# Patient Record
Sex: Female | Born: 1960
Health system: Southern US, Community
[De-identification: ages and names within clinical notes are randomized; demographics above are authoritative.]

## PROBLEM LIST (undated history)

## (undated) DIAGNOSIS — C801 Malignant (primary) neoplasm, unspecified: Secondary | ICD-10-CM

## (undated) DIAGNOSIS — Z85828 Personal history of other malignant neoplasm of skin: Secondary | ICD-10-CM

## (undated) HISTORY — PX: FINGER SURGERY: SHX640

## (undated) HISTORY — DX: Personal history of other malignant neoplasm of skin: Z85.828

---

## 2007-02-17 ENCOUNTER — Ambulatory Visit: Payer: Self-pay | Admitting: Obstetrics and Gynecology

## 2007-05-27 DIAGNOSIS — Z7251 High risk heterosexual behavior: Secondary | ICD-10-CM | POA: Insufficient documentation

## 2008-03-01 DIAGNOSIS — B373 Candidiasis of vulva and vagina: Secondary | ICD-10-CM | POA: Insufficient documentation

## 2008-03-01 DIAGNOSIS — B3731 Acute candidiasis of vulva and vagina: Secondary | ICD-10-CM | POA: Insufficient documentation

## 2008-07-19 DIAGNOSIS — M542 Cervicalgia: Secondary | ICD-10-CM | POA: Insufficient documentation

## 2008-07-19 DIAGNOSIS — N926 Irregular menstruation, unspecified: Secondary | ICD-10-CM | POA: Insufficient documentation

## 2008-08-30 ENCOUNTER — Ambulatory Visit: Payer: Self-pay | Admitting: Family Medicine

## 2008-09-06 ENCOUNTER — Ambulatory Visit: Payer: Self-pay | Admitting: Family Medicine

## 2010-05-26 ENCOUNTER — Ambulatory Visit: Payer: Self-pay | Admitting: Unknown Physician Specialty

## 2011-06-18 ENCOUNTER — Ambulatory Visit: Payer: Self-pay | Admitting: Unknown Physician Specialty

## 2013-09-27 DIAGNOSIS — C44310 Basal cell carcinoma of skin of unspecified parts of face: Secondary | ICD-10-CM | POA: Insufficient documentation

## 2013-12-06 DIAGNOSIS — Z85828 Personal history of other malignant neoplasm of skin: Secondary | ICD-10-CM

## 2013-12-06 HISTORY — DX: Personal history of other malignant neoplasm of skin: Z85.828

## 2014-05-01 DIAGNOSIS — L570 Actinic keratosis: Secondary | ICD-10-CM

## 2014-05-01 HISTORY — DX: Actinic keratosis: L57.0

## 2014-07-17 LAB — BASIC METABOLIC PANEL
BUN: 17 mg/dL (ref 4–21)
Creatinine: 0.7 mg/dL (ref 0.5–1.1)
Glucose: 86 mg/dL
Potassium: 4.6 mmol/L (ref 3.4–5.3)
Sodium: 142 mmol/L (ref 137–147)

## 2014-07-17 LAB — HEPATIC FUNCTION PANEL
ALT: 21 U/L (ref 7–35)
AST: 21 U/L (ref 13–35)

## 2014-07-17 LAB — LIPID PANEL
Cholesterol: 176 mg/dL (ref 0–200)
HDL: 97 mg/dL — AB (ref 35–70)
LDL Cholesterol: 65 mg/dL
Triglycerides: 69 mg/dL (ref 40–160)

## 2014-07-17 LAB — CBC AND DIFFERENTIAL
HCT: 43 % (ref 36–46)
Hemoglobin: 14.9 g/dL (ref 12.0–16.0)
Platelets: 290 10*3/uL (ref 150–399)
WBC: 4.2 10^3/mL

## 2014-07-17 LAB — TSH: TSH: 1.82 u[IU]/mL (ref 0.41–5.90)

## 2014-08-03 ENCOUNTER — Ambulatory Visit: Payer: Self-pay | Admitting: Gastroenterology

## 2014-08-03 LAB — HM COLONOSCOPY

## 2015-10-22 DIAGNOSIS — D259 Leiomyoma of uterus, unspecified: Secondary | ICD-10-CM | POA: Insufficient documentation

## 2015-10-22 DIAGNOSIS — Z8619 Personal history of other infectious and parasitic diseases: Secondary | ICD-10-CM | POA: Insufficient documentation

## 2015-10-25 ENCOUNTER — Ambulatory Visit (INDEPENDENT_AMBULATORY_CARE_PROVIDER_SITE_OTHER): Payer: BLUE CROSS/BLUE SHIELD | Admitting: Physician Assistant

## 2015-10-25 ENCOUNTER — Encounter: Payer: Self-pay | Admitting: Physician Assistant

## 2015-10-25 VITALS — BP 102/70 | HR 77 | Temp 98.2°F | Resp 16 | Ht 63.0 in | Wt 131.2 lb

## 2015-10-25 DIAGNOSIS — Z124 Encounter for screening for malignant neoplasm of cervix: Secondary | ICD-10-CM

## 2015-10-25 DIAGNOSIS — Z0000 Encounter for general adult medical examination without abnormal findings: Secondary | ICD-10-CM | POA: Diagnosis not present

## 2015-10-25 DIAGNOSIS — E538 Deficiency of other specified B group vitamins: Secondary | ICD-10-CM | POA: Insufficient documentation

## 2015-10-25 DIAGNOSIS — E559 Vitamin D deficiency, unspecified: Secondary | ICD-10-CM | POA: Insufficient documentation

## 2015-10-25 NOTE — Patient Instructions (Signed)
Health Maintenance, Female Adopting a healthy lifestyle and getting preventive care can go a long way to promote health and wellness. Talk with your health care provider about what schedule of regular examinations is right for you. This is a good chance for you to check in with your provider about disease prevention and staying healthy. In between checkups, there are plenty of things you can do on your own. Experts have done a lot of research about which lifestyle changes and preventive measures are most likely to keep you healthy. Ask your health care provider for more information. WEIGHT AND DIET  Eat a healthy diet  Be sure to include plenty of vegetables, fruits, low-fat dairy products, and lean protein.  Do not eat a lot of foods high in solid fats, added sugars, or salt.  Get regular exercise. This is one of the most important things you can do for your health.  Most adults should exercise for at least 150 minutes each week. The exercise should increase your heart rate and make you sweat (moderate-intensity exercise).  Most adults should also do strengthening exercises at least twice a week. This is in addition to the moderate-intensity exercise.  Maintain a healthy weight  Body mass index (BMI) is a measurement that can be used to identify possible weight problems. It estimates body fat based on height and weight. Your health care provider can help determine your BMI and help you achieve or maintain a healthy weight.  For females 28 years of age and older:   A BMI below 18.5 is considered underweight.  A BMI of 18.5 to 24.9 is normal.  A BMI of 25 to 29.9 is considered overweight.  A BMI of 30 and above is considered obese.  Watch levels of cholesterol and blood lipids  You should start having your blood tested for lipids and cholesterol at 53 years of age, then have this test every 5 years.  You may need to have your cholesterol levels checked more often if:  Your lipid  or cholesterol levels are high.  You are older than 53 years of age.  You are at high risk for heart disease.  CANCER SCREENING   Lung Cancer  Lung cancer screening is recommended for adults 75-66 years old who are at high risk for lung cancer because of a history of smoking.  A yearly low-dose CT scan of the lungs is recommended for people who:  Currently smoke.  Have quit within the past 15 years.  Have at least a 30-pack-year history of smoking. A pack year is smoking an average of one pack of cigarettes a day for 1 year.  Yearly screening should continue until it has been 15 years since you quit.  Yearly screening should stop if you develop a health problem that would prevent you from having lung cancer treatment.  Breast Cancer  Practice breast self-awareness. This means understanding how your breasts normally appear and feel.  It also means doing regular breast self-exams. Let your health care provider know about any changes, no matter how small.  If you are in your 20s or 30s, you should have a clinical breast exam (CBE) by a health care provider every 1-3 years as part of a regular health exam.  If you are 25 or older, have a CBE every year. Also consider having a breast X-ray (mammogram) every year.  If you have a family history of breast cancer, talk to your health care provider about genetic screening.  If you  are at high risk for breast cancer, talk to your health care provider about having an MRI and a mammogram every year.  Breast cancer gene (BRCA) assessment is recommended for women who have family members with BRCA-related cancers. BRCA-related cancers include:  Breast.  Ovarian.  Tubal.  Peritoneal cancers.  Results of the assessment will determine the need for genetic counseling and BRCA1 and BRCA2 testing. Cervical Cancer Your health care provider may recommend that you be screened regularly for cancer of the pelvic organs (ovaries, uterus, and  vagina). This screening involves a pelvic examination, including checking for microscopic changes to the surface of your cervix (Pap test). You may be encouraged to have this screening done every 3 years, beginning at age 21.  For women ages 30-65, health care providers may recommend pelvic exams and Pap testing every 3 years, or they may recommend the Pap and pelvic exam, combined with testing for human papilloma virus (HPV), every 5 years. Some types of HPV increase your risk of cervical cancer. Testing for HPV may also be done on women of any age with unclear Pap test results.  Other health care providers may not recommend any screening for nonpregnant women who are considered low risk for pelvic cancer and who do not have symptoms. Ask your health care provider if a screening pelvic exam is right for you.  If you have had past treatment for cervical cancer or a condition that could lead to cancer, you need Pap tests and screening for cancer for at least 20 years after your treatment. If Pap tests have been discontinued, your risk factors (such as having a new sexual partner) need to be reassessed to determine if screening should resume. Some women have medical problems that increase the chance of getting cervical cancer. In these cases, your health care provider may recommend more frequent screening and Pap tests. Colorectal Cancer  This type of cancer can be detected and often prevented.  Routine colorectal cancer screening usually begins at 53 years of age and continues through 53 years of age.  Your health care provider may recommend screening at an earlier age if you have risk factors for colon cancer.  Your health care provider may also recommend using home test kits to check for hidden blood in the stool.  A small camera at the end of a tube can be used to examine your colon directly (sigmoidoscopy or colonoscopy). This is done to check for the earliest forms of colorectal  cancer.  Routine screening usually begins at age 50.  Direct examination of the colon should be repeated every 5-10 years through 53 years of age. However, you may need to be screened more often if early forms of precancerous polyps or small growths are found. Skin Cancer  Check your skin from head to toe regularly.  Tell your health care provider about any new moles or changes in moles, especially if there is a change in a mole's shape or color.  Also tell your health care provider if you have a mole that is larger than the size of a pencil eraser.  Always use sunscreen. Apply sunscreen liberally and repeatedly throughout the day.  Protect yourself by wearing long sleeves, pants, a wide-brimmed hat, and sunglasses whenever you are outside. HEART DISEASE, DIABETES, AND HIGH BLOOD PRESSURE   High blood pressure causes heart disease and increases the risk of stroke. High blood pressure is more likely to develop in:  People who have blood pressure in the high end   of the normal range (130-139/85-89 mm Hg).  People who are overweight or obese.  People who are African American.  If you are 38-23 years of age, have your blood pressure checked every 3-5 years. If you are 61 years of age or older, have your blood pressure checked every year. You should have your blood pressure measured twice--once when you are at a hospital or clinic, and once when you are not at a hospital or clinic. Record the average of the two measurements. To check your blood pressure when you are not at a hospital or clinic, you can use:  An automated blood pressure machine at a pharmacy.  A home blood pressure monitor.  If you are between 45 years and 39 years old, ask your health care provider if you should take aspirin to prevent strokes.  Have regular diabetes screenings. This involves taking a blood sample to check your fasting blood sugar level.  If you are at a normal weight and have a low risk for diabetes,  have this test once every three years after 53 years of age.  If you are overweight and have a high risk for diabetes, consider being tested at a younger age or more often. PREVENTING INFECTION  Hepatitis B  If you have a higher risk for hepatitis B, you should be screened for this virus. You are considered at high risk for hepatitis B if:  You were born in a country where hepatitis B is common. Ask your health care provider which countries are considered high risk.  Your parents were born in a high-risk country, and you have not been immunized against hepatitis B (hepatitis B vaccine).  You have HIV or AIDS.  You use needles to inject street drugs.  You live with someone who has hepatitis B.  You have had sex with someone who has hepatitis B.  You get hemodialysis treatment.  You take certain medicines for conditions, including cancer, organ transplantation, and autoimmune conditions. Hepatitis C  Blood testing is recommended for:  Everyone born from [birth year] through 1964.  Anyone with known risk factors for hepatitis C. Sexually transmitted infections (STIs)  You should be screened for sexually transmitted infections (STIs) including gonorrhea and chlamydia if:  You are sexually active and are younger than 53 years of age.  You are older than 53 years of age and your health care provider tells you that you are at risk for this type of infection.  Your sexual activity has changed since you were last screened and you are at an increased risk for chlamydia or gonorrhea. Ask your health care provider if you are at risk.  If you do not have HIV, but are at risk, it may be recommended that you take a prescription medicine daily to prevent HIV infection. This is called pre-exposure prophylaxis (PrEP). You are considered at risk if:  You are sexually active and do not regularly use condoms or know the HIV status of your partner(s).  You take drugs by injection.  You are sexually  active with a partner who has HIV. Talk with your health care provider about whether you are at high risk of being infected with HIV. If you choose to begin PrEP, you should first be tested for HIV. You should then be tested every 3 months for as long as you are taking PrEP.  PREGNANCY   If you are premenopausal and you may become pregnant, ask your health care provider about preconception counseling.  If you may  become pregnant, take 400 to 800 micrograms (mcg) of folic acid every day.  If you want to prevent pregnancy, talk to your health care provider about birth control (contraception). OSTEOPOROSIS AND MENOPAUSE   Osteoporosis is a disease in which the bones lose minerals and strength with aging. This can result in serious bone fractures. Your risk for osteoporosis can be identified using a bone density scan.  If you are 61 years of age or older, or if you are at risk for osteoporosis and fractures, ask your health care provider if you should be screened.  Ask your health care provider whether you should take a calcium or vitamin D supplement to lower your risk for osteoporosis.  Menopause may have certain physical symptoms and risks.  Hormone replacement therapy may reduce some of these symptoms and risks. Talk to your health care provider about whether hormone replacement therapy is right for you.  HOME CARE INSTRUCTIONS   Schedule regular health, dental, and eye exams.  Stay current with your immunizations.   Do not use any tobacco products including cigarettes, chewing tobacco, or electronic cigarettes.  If you are pregnant, do not drink alcohol.  If you are breastfeeding, limit how much and how often you drink alcohol.  Limit alcohol intake to no more than 1 drink per day for nonpregnant women. One drink equals 12 ounces of beer, 5 ounces of wine, or 1 ounces of hard liquor.  Do not use street drugs.  Do not share needles.  Ask your health care provider for help if  you need support or information about quitting drugs.  Tell your health care provider if you often feel depressed.  Tell your health care provider if you have ever been abused or do not feel safe at home.   This information is not intended to replace advice given to you by your health care provider. Make sure you discuss any questions you have with your health care provider.   Document Released: 06/29/2011 Document Revised: 01/04/2015 Document Reviewed: 11/15/2013 Elsevier Interactive Patient Education [employer].

## 2015-10-25 NOTE — Progress Notes (Signed)
[name], Female    DOB: Apr 19, 1961, 54 y.o.   MRN: [id number] Visit Date: 10/25/2015  Today's Provider: [name], PA-C   Chief Complaint  Patient presents with  . Annual Exam   Subjective:    Annual physical exam Natalie Dickerson is a 54 y.o. female who presents today for health maintenance and complete physical. She feels well. She reports exercising, walking and cardio 4 times a week and some weight lifting one time a week. She reports she is sleeping fairly well. Patient Declined Flu Vaccine.  She cannot remember when her last Pap smear was done. She states her Pap smears have been normal. There is no family history of cervical or ovarian cancer. She does perform monthly self breast exams. Mammogram was done in January 2016 and was reported normal but with fibro-densities. There is no family history of breast cancer. Colonoscopy was done in 2015 and was positive for adenomatous polyps. This is to be repeated in 5 years. There is no family history of colon cancer.  Last PCP: 06/15/14 Mammogram: 01/02/2015 Normal/Negative-fibrous dense breast tissue Colonoscopy:08/03/14 with findings of adenomatous polyps. Repeat in 5 years    Review of Systems  Constitutional: Negative.   HENT: Negative.   Eyes: Negative.   Respiratory: Negative.   Cardiovascular: Negative.   Gastrointestinal: Negative.   Endocrine: Negative.   Genitourinary: Negative.   Musculoskeletal: Negative.   Skin: Negative.   Allergic/Immunologic: Negative.   Neurological: Negative.   Hematological: Negative.   Psychiatric/Behavioral: Negative.     Social History She  reports that she has quit smoking. She does not have any smokeless tobacco history on file. She reports that she drinks alcohol. She reports that she does not use illicit drugs. Social History   Social History  . Marital Status: Married    Spouse Name: N/A  . Number of Children: N/A  . Years of Education: N/A     Social History Main Topics  . Smoking status: Former Research scientist (life sciences)  . Smokeless tobacco: None     Comment: Smoked for about 5 years, smoked about 1 pack per day. Quit in 1984  . Alcohol Use: Yes     Comment: Occasional alcohol use; Drinks 2-4 glasses of wine per week.  . Drug Use: No  . Sexual Activity: Not Asked   Other Topics Concern  . None   Social History Narrative    Patient Active Problem List   Diagnosis Date Noted  . History of chicken pox 10/22/2015  . Uterine fibroid 10/22/2015  . Basal cell carcinoma of face 09/27/2013  . Cervical pain 07/19/2008  . Irregular bleeding 07/19/2008  . Candidal vulvovaginitis 03/01/2008  . High risk sexual behavior 05/27/2007    History reviewed. No pertinent past surgical history.  Family History  Family Status  Relation Status Death Age  . Mother Alive   . Father Deceased 59    died from breast cancer   . Maternal Grandmother Deceased   . Maternal Grandfather Deceased   . Paternal Grandmother Deceased   . Paternal Grandfather Deceased    Her family history includes Cancer in her mother; Hypertension in her mother.    No Known Allergies  Previous Medications   CHOLECALCIFEROL (VITAMIN D-3) 1000 UNITS CAPS    Take 1 capsule by mouth daily.   CYANOCOBALAMIN (VITAMIN B12 PO)    Take by mouth daily.   FLAXSEED-EVE PRIM-BORAGE (FLAX OIL XTRA) CAPS    Take by mouth.  MULTIPLE VITAMIN TABLET    Take by mouth.    Patient Care Team: [name], PA-C as PCP - General (Family Medicine)     Objective:   Vitals: BP 102/70 mmHg  Pulse 77  Temp(Src) 98.2 F (36.8 C)  Resp 16  Ht 5\' 3"  (1.6 m)  Wt 131 lb 3.2 oz (59.512 kg)  BMI 23.25 kg/m2   Physical Exam  Constitutional: She is oriented to person, place, and time. She appears well-developed and well-nourished. No distress.  HENT:  Head: Normocephalic and atraumatic.  Right Ear: Hearing, tympanic membrane, external ear and ear canal normal.  Left Ear: Hearing,  tympanic membrane, external ear and ear canal normal.  Nose: Nose normal.  Mouth/Throat: Uvula is midline, oropharynx is clear and moist and mucous membranes are normal. No oropharyngeal exudate.  Eyes: Conjunctivae and EOM are normal. Pupils are equal, round, and reactive to light. Right eye exhibits no discharge. Left eye exhibits no discharge. No scleral icterus.  Neck: Normal range of motion. Neck supple. No JVD present. Carotid bruit is not present. No tracheal deviation present. No thyromegaly present.  Cardiovascular: Normal rate, regular rhythm, normal heart sounds and intact distal pulses.  Exam reveals no gallop and no friction rub.   No murmur heard. Pulmonary/Chest: Effort normal and breath sounds normal. No respiratory distress. She has no wheezes. She has no rales. She exhibits no tenderness. Right breast exhibits no inverted nipple, no mass, no nipple discharge, no skin change and no tenderness. Left breast exhibits no inverted nipple, no mass, no nipple discharge, no skin change and no tenderness. Breasts are symmetrical.  Abdominal: Soft. Bowel sounds are normal. She exhibits no distension and no mass. There is no tenderness. There is no rebound and no guarding. Hernia confirmed negative in the right inguinal area and confirmed negative in the left inguinal area.  Genitourinary: Rectum normal, vagina normal and uterus normal. No breast swelling, tenderness, discharge or bleeding. Pelvic exam was performed with patient supine. There is no rash, tenderness, lesion or injury on the right labia. There is no rash, tenderness, lesion or injury on the left labia. Cervix exhibits no motion tenderness, no discharge and no friability. Right adnexum displays no mass, no tenderness and no fullness. Left adnexum displays no mass, no tenderness and no fullness. No erythema, tenderness or bleeding in the vagina. No signs of injury around the vagina. No vaginal discharge found.  Musculoskeletal: Normal  range of motion. She exhibits no edema or tenderness.  Lymphadenopathy:    She has no cervical adenopathy.       Right: No inguinal adenopathy present.       Left: No inguinal adenopathy present.  Neurological: She is alert and oriented to person, place, and time. She has normal reflexes. No cranial nerve deficit. Coordination normal.  Skin: Skin is warm and dry. No rash noted. She is not diaphoretic.  Psychiatric: She has a normal mood and affect. Her behavior is normal. Judgment and thought content normal.  Vitals reviewed.    Depression Screen No flowsheet data found.    Assessment & Plan:     Routine Health Maintenance and Physical Exam  1. Annual physical exam Physical exam was normal today. We will check labs as below. I will follow-up with her pending lab results or in one year for her repeat physical exam if lab results are normal. - CBC with Differential - Comprehensive metabolic panel - Lipid panel - TSH  2. Cervical cancer screening Pap was  collected today. We'll follow-up pending results. I will see her back in one year for her repeat physical exam. If Pap is normal will be repeated in 3 years. - Pap IG w/ reflex to HPV when [facility] ([facility])   Exercise Activities and Dietary recommendations Goals    None      Immunization History  Administered Date(s) Administered  . Tdap 08/16/2008    Health Maintenance  Topic Date Due  . Hepatitis C Screening  09/[birth year]/1962  . HIV Screening  12/14/1977  . PAP SMEAR  12/15/1983  . MAMMOGRAM  12/14/2012  . INFLUENZA VACCINE  07/29/2015  . TETANUS/TDAP  08/16/2018  . COLONOSCOPY  08/03/2024      Discussed health benefits of physical activity, and encouraged her to engage in regular exercise appropriate for her age and condition.    --------------------------------------------------------------------

## 2015-10-26 LAB — LIPID PANEL
Chol/HDL Ratio: 2.2 ratio units (ref 0.0–4.4)
Cholesterol, Total: 173 mg/dL (ref 100–199)
HDL: 80 mg/dL (ref >39)
LDL Calculated: 78 mg/dL (ref 0–99)
Triglycerides: 74 mg/dL (ref 0–149)
VLDL Cholesterol Cal: 15 mg/dL (ref 5–40)

## 2015-10-26 LAB — CBC WITH DIFFERENTIAL/PLATELET
Basophils Absolute: 0.1 10*3/uL (ref 0.0–0.2)
Basos: 2 %
EOS (ABSOLUTE): 0 10*3/uL (ref 0.0–0.4)
Eos: 0 %
Hematocrit: 42.2 % (ref 34.0–46.6)
Hemoglobin: 14 g/dL (ref 11.1–15.9)
Immature Grans (Abs): 0 10*3/uL (ref 0.0–0.1)
Immature Granulocytes: 0 %
Lymphocytes Absolute: 1.5 10*3/uL (ref 0.7–3.1)
Lymphs: 35 %
MCH: 29.8 pg (ref 26.6–33.0)
MCHC: 33.2 g/dL (ref 31.5–35.7)
MCV: 90 fL (ref 79–97)
Monocytes Absolute: 0.4 10*3/uL (ref 0.1–0.9)
Monocytes: 9 %
Neutrophils Absolute: 2.4 10*3/uL (ref 1.4–7.0)
Neutrophils: 54 %
Platelets: 295 10*3/uL (ref 150–379)
RBC: 4.7 x10E6/uL (ref 3.77–5.28)
RDW: 12.7 % (ref 12.3–15.4)
WBC: 4.5 10*3/uL (ref 3.4–10.8)

## 2015-10-26 LAB — COMPREHENSIVE METABOLIC PANEL
ALT: 20 IU/L (ref 0–32)
AST: 21 IU/L (ref 0–40)
Albumin/Globulin Ratio: 1.5 (ref 1.1–2.5)
Albumin: 4.2 g/dL (ref 3.5–5.5)
Alkaline Phosphatase: 82 IU/L (ref 39–117)
BUN/Creatinine Ratio: 17 (ref 9–23)
BUN: 11 mg/dL (ref 6–24)
Bilirubin Total: 0.4 mg/dL (ref 0.0–1.2)
CO2: 25 mmol/L (ref 18–29)
Calcium: 9.6 mg/dL (ref 8.7–10.2)
Chloride: 103 mmol/L (ref 97–106)
Creatinine, Ser: 0.65 mg/dL (ref 0.57–1.00)
GFR calc Af Amer: 118 mL/min/{1.73_m2} (ref >59)
GFR calc non Af Amer: 102 mL/min/{1.73_m2} (ref >59)
Globulin, Total: 2.8 g/dL (ref 1.5–4.5)
Glucose: 82 mg/dL (ref 65–99)
Potassium: 4.7 mmol/L (ref 3.5–5.2)
Sodium: 144 mmol/L (ref 136–144)
Total Protein: 7 g/dL (ref 6.0–8.5)

## 2015-10-26 LAB — TSH: TSH: 1.13 u[IU]/mL (ref 0.450–4.500)

## 2015-10-28 ENCOUNTER — Telehealth: Payer: Self-pay

## 2015-10-28 NOTE — Telephone Encounter (Signed)
-----   Message from [name], PA-C sent at 10/28/2015  8:55 AM EDT ----- Labs are WNL.

## 2015-10-28 NOTE — Telephone Encounter (Signed)
Patient advised as directed below.  Thanks,  -Judythe Postema 

## 2015-11-05 LAB — PAP IG W/ RFLX HPV ASCU: PAP Smear Comment: 0

## 2015-11-05 LAB — HPV DNA PROBE HIGH RISK, AMPLIFIED

## 2015-11-05 LAB — HPV, LOW VOLUME (REFLEX): HPV low volume reflex: NEGATIVE

## 2015-11-06 ENCOUNTER — Telehealth: Payer: Self-pay

## 2015-11-06 NOTE — Telephone Encounter (Signed)
LMTCB  Thanks,  -Shaida Route 

## 2015-11-06 NOTE — Telephone Encounter (Signed)
LMTCB  Thanks,  -Joseline 

## 2015-11-06 NOTE — Telephone Encounter (Signed)
-----   Message from [name], [place] sent at 11/06/2015  8:35 AM EST ----- Your pap test did return with atypical cells of undetermined significance.  It was HPV negative however.  ACOG guidelines for atypical cells with negative HPV for anyone over 30 is to repeat pap in 3 years like scheduled.  If wanted we can repeat in 12 months however.  ACOG guidelines recommend repeating in 12 months if you are younger than 53 years old or if it was HPV positive as well, which yours was not.  It should be ok to repeat in 3 years, but the decision is yours.

## 2015-11-06 NOTE — Telephone Encounter (Signed)
Pt returned call. Thanks TNP °

## 2015-11-06 NOTE — Telephone Encounter (Signed)
That is perfect.  We will repeat pap in 12 months.

## 2015-11-06 NOTE — Telephone Encounter (Signed)
Patient advised as directed below. Per patient if insurance pays for the 12 month repeat she prefers to be repeated in 12 months just to feel more safe.  Thanks,  -Joseline

## 2016-06-13 ENCOUNTER — Emergency Department
Admission: EM | Admit: 2016-06-13 | Discharge: 2016-06-13 | Disposition: A | Discharge status: Home / Self Care | Payer: Worker's Compensation | Attending: Emergency Medicine | Admitting: Emergency Medicine

## 2016-06-13 ENCOUNTER — Emergency Department: Payer: Worker's Compensation

## 2016-06-13 ENCOUNTER — Encounter: Payer: Self-pay | Admitting: Emergency Medicine

## 2016-06-13 DIAGNOSIS — F0781 Postconcussional syndrome: Secondary | ICD-10-CM | POA: Diagnosis not present

## 2016-06-13 DIAGNOSIS — S0093XA Contusion of unspecified part of head, initial encounter: Secondary | ICD-10-CM | POA: Insufficient documentation

## 2016-06-13 DIAGNOSIS — Z87891 Personal history of nicotine dependence: Secondary | ICD-10-CM | POA: Diagnosis not present

## 2016-06-13 DIAGNOSIS — W228XXA Striking against or struck by other objects, initial encounter: Secondary | ICD-10-CM | POA: Insufficient documentation

## 2016-06-13 DIAGNOSIS — Y929 Unspecified place or not applicable: Secondary | ICD-10-CM | POA: Diagnosis not present

## 2016-06-13 DIAGNOSIS — S0990XA Unspecified injury of head, initial encounter: Secondary | ICD-10-CM | POA: Diagnosis present

## 2016-06-13 DIAGNOSIS — Z79899 Other long term (current) drug therapy: Secondary | ICD-10-CM | POA: Insufficient documentation

## 2016-06-13 DIAGNOSIS — Y990 Civilian activity done for income or pay: Secondary | ICD-10-CM | POA: Diagnosis not present

## 2016-06-13 DIAGNOSIS — Z85828 Personal history of other malignant neoplasm of skin: Secondary | ICD-10-CM | POA: Insufficient documentation

## 2016-06-13 DIAGNOSIS — Y9302 Activity, running: Secondary | ICD-10-CM | POA: Insufficient documentation

## 2016-06-13 DIAGNOSIS — G44309 Post-traumatic headache, unspecified, not intractable: Secondary | ICD-10-CM | POA: Insufficient documentation

## 2016-06-13 NOTE — Discharge Instructions (Signed)
Concussion, Adult A concussion, or closed-head injury, is a brain injury caused by a direct blow to the head or by a quick and sudden movement (jolt) of the head or neck. Concussions are usually not life-threatening. Even so, the effects of a concussion can be serious. If you have had a concussion before, you are more likely to experience concussion-like symptoms after a direct blow to the head.  CAUSES  Direct blow to the head, such as from running into another player during a soccer game, being hit in a fight, or hitting your head on a hard surface.  A jolt of the head or neck that causes the brain to move back and forth inside the skull, such as in a car crash. SIGNS AND SYMPTOMS The signs of a concussion can be hard to notice. Early on, they may be missed by you, family members, and health care providers. You may look fine but act or feel differently. Symptoms are usually temporary, but they may last for days, weeks, or even longer. Some symptoms may appear right away while others may not show up for hours or days. Every head injury is different. Symptoms include:  Mild to moderate headaches that will not go away.  A feeling of pressure inside your head.  Having more trouble than usual:  Learning or remembering things you have heard.  Answering questions.  Paying attention or concentrating.  Organizing daily tasks.  Making decisions and solving problems.  Slowness in thinking, acting or reacting, speaking, or reading.  Getting lost or being easily confused.  Feeling tired all the time or lacking energy (fatigued).  Feeling drowsy.  Sleep disturbances.  Sleeping more than usual.  Sleeping less than usual.  Trouble falling asleep.  Trouble sleeping (insomnia).  Loss of balance or feeling lightheaded or dizzy.  Nausea or vomiting.  Numbness or tingling.  Increased sensitivity to:  Sounds.  Lights.  Distractions.  Vision problems or eyes that tire  easily.  Diminished sense of taste or smell.  Ringing in the ears.  Mood changes such as feeling sad or anxious.  Becoming easily irritated or angry for little or no reason.  Lack of motivation.  Seeing or hearing things other people do not see or hear (hallucinations). DIAGNOSIS Your health care provider can usually diagnose a concussion based on a description of your injury and symptoms. He or she will ask whether you passed out (lost consciousness) and whether you are having trouble remembering events that happened right before and during your injury. Your evaluation might include:  A brain scan to look for signs of injury to the brain. Even if the test shows no injury, you may still have a concussion.  Blood tests to be sure other problems are not present. TREATMENT  Concussions are usually treated in an emergency department, in urgent care, or at a clinic. You may need to stay in the hospital overnight for further treatment.  Tell your health care provider if you are taking any medicines, including prescription medicines, over-the-counter medicines, and natural remedies. Some medicines, such as blood thinners (anticoagulants) and aspirin, may increase the chance of complications. Also tell your health care provider whether you have had alcohol or are taking illegal drugs. This information may affect treatment.  Your health care provider will send you home with important instructions to follow.  How fast you will recover from a concussion depends on many factors. These factors include how severe your concussion is, what part of your brain was injured,  your age, and how healthy you were before the concussion.  Most people with mild injuries recover fully. Recovery can take time. In general, recovery is slower in older persons. Also, persons who have had a concussion in the past or have other medical problems may find that it takes longer to recover from their current injury. HOME  CARE INSTRUCTIONS General Instructions  Carefully follow the directions your health care provider gave you.  Only take over-the-counter or prescription medicines for pain, discomfort, or fever as directed by your health care provider.  Take only those medicines that your health care provider has approved.  Do not drink alcohol until your health care provider says you are well enough to do so. Alcohol and certain other drugs may slow your recovery and can put you at risk of further injury.  If it is harder than usual to remember things, write them down.  If you are easily distracted, try to do one thing at a time. For example, do not try to watch TV while fixing dinner.  Talk with family members or close friends when making important decisions.  Keep all follow-up appointments. Repeated evaluation of your symptoms is recommended for your recovery.  Watch your symptoms and tell others to do the same. Complications sometimes occur after a concussion. Older adults with a brain injury may have a higher risk of serious complications, such as a blood clot on the brain.  Tell your teachers, school nurse, school counselor, coach, athletic trainer, or work Freight forwarder about your injury, symptoms, and restrictions. Tell them about what you can or cannot do. They should watch for:  Increased problems with attention or concentration.  Increased difficulty remembering or learning new information.  Increased time needed to complete tasks or assignments.  Increased irritability or decreased ability to cope with stress.  Increased symptoms.  Rest. Rest helps the brain to heal. Make sure you:  Get plenty of sleep at night. Avoid staying up late at night.  Keep the same bedtime hours on weekends and weekdays.  Rest during the day. Take daytime naps or rest breaks when you feel tired.  Limit activities that require a lot of thought or concentration. These include:  Doing homework or job-related  work.  Watching TV.  Working on the computer.  Avoid any situation where there is potential for another head injury (football, hockey, soccer, basketball, martial arts, downhill snow sports and horseback riding). Your condition will get worse every time you experience a concussion. You should avoid these activities until you are evaluated by the appropriate follow-up health care providers. Returning To Your Regular Activities You will need to return to your normal activities slowly, not all at once. You must give your body and brain enough time for recovery.  Do not return to sports or other athletic activities until your health care provider tells you it is safe to do so.  Ask your health care provider when you can drive, ride a bicycle, or operate heavy machinery. Your ability to react may be slower after a brain injury. Never do these activities if you are dizzy.  Ask your health care provider about when you can return to work or school. Preventing Another Concussion It is very important to avoid another brain injury, especially before you have recovered. In rare cases, another injury can lead to permanent brain damage, brain swelling, or death. The risk of this is greatest during the first 7-10 days after a head injury. Avoid injuries by:  Wearing a  seat belt when riding in a car.  Drinking alcohol only in moderation.  Wearing a helmet when biking, skiing, skateboarding, skating, or doing similar activities.  Avoiding activities that could lead to a second concussion, such as contact or recreational sports, until your health care provider says it is okay.  Taking safety measures in your home.  Remove clutter and tripping hazards from floors and stairways.  Use grab bars in bathrooms and handrails by stairs.  Place non-slip mats on floors and in bathtubs.  Improve lighting in dim areas. SEEK MEDICAL CARE IF:  You have increased problems paying attention or  concentrating.  You have increased difficulty remembering or learning new information.  You need more time to complete tasks or assignments than before.  You have increased irritability or decreased ability to cope with stress.  You have more symptoms than before. Seek medical care if you have any of the following symptoms for more than 2 weeks after your injury:  Lasting (chronic) headaches.  Dizziness or balance problems.  Nausea.  Vision problems.  Increased sensitivity to noise or light.  Depression or mood swings.  Anxiety or irritability.  Memory problems.  Difficulty concentrating or paying attention.  Sleep problems.  Feeling tired all the time. SEEK IMMEDIATE MEDICAL CARE IF:  You have severe or worsening headaches. These may be a sign of a blood clot in the brain.  You have weakness (even if only in one hand, leg, or part of the face).  You have numbness.  You have decreased coordination.  You vomit repeatedly.  You have increased sleepiness.  One pupil is larger than the other.  You have convulsions.  You have slurred speech.  You have increased confusion. This may be a sign of a blood clot in the brain.  You have increased restlessness, agitation, or irritability.  You are unable to recognize people or places.  You have neck pain.  It is difficult to wake you up.  You have unusual behavior changes.  You lose consciousness. MAKE SURE YOU:  Understand these instructions.  Will watch your condition.  Will get help right away if you are not doing well or get worse.   This information is not intended to replace advice given to you by your health care provider. Make sure you discuss any questions you have with your health care provider.   Document Released: 03/05/2004 Document Revised: 01/04/2015 Document Reviewed: 07/06/2013 Elsevier Interactive Patient Education 2016 [place] A contusion is a deep bruise.  Contusions happen when an injury causes bleeding under the skin. Symptoms of bruising include pain, swelling, and discolored skin. The skin may turn blue, purple, or yellow. HOME CARE   Rest the injured area.  If told, put ice on the injured area.  Put ice in a plastic bag.  Place a towel between your skin and the bag.  Leave the ice on for 20 minutes, 2-3 times per day.  If told, put light pressure (compression) on the injured area using an elastic bandage. Make sure the bandage is not too tight. Remove it and put it back on as told by your doctor.  If possible, raise (elevate) the injured area above the level of your heart while you are sitting or lying down.  Take over-the-counter and prescription medicines only as told by your doctor. GET HELP IF:  Your symptoms do not get better after several days of treatment.  Your symptoms get worse.  You have trouble moving the injured  area. GET HELP RIGHT AWAY IF:   You have very bad pain.  You have a loss of feeling (numbness) in a hand or foot.  Your hand or foot turns pale or cold.   This information is not intended to replace advice given to you by your health care provider. Make sure you discuss any questions you have with your health care provider.   Document Released: 06/01/2008 Document Revised: 09/04/2015 Document Reviewed: 05/01/2015 Elsevier Interactive Patient Education 2016 [place].  Facial or Scalp Contusion A facial or scalp contusion is a deep bruise on the face or head. Injuries to the face and head generally cause a lot of swelling, especially around the eyes. Contusions are the result of an injury that caused bleeding under the skin. The contusion may turn blue, purple, or yellow. Minor injuries will give you a painless contusion, but more severe contusions may stay painful and swollen for a few weeks.  CAUSES  A facial or scalp contusion is caused by a blunt injury or trauma to the face or head area.   SIGNS AND SYMPTOMS   Swelling of the injured area.   Discoloration of the injured area.   Tenderness, soreness, or pain in the injured area.  DIAGNOSIS  The diagnosis can be made by taking a medical history and doing a physical exam. An X-ray exam, CT scan, or MRI may be needed to determine if there are any associated injuries, such as broken bones (fractures). TREATMENT  Often, the best treatment for a facial or scalp contusion is applying cold compresses to the injured area. Over-the-counter medicines may also be recommended for pain control.  HOME CARE INSTRUCTIONS   Only take over-the-counter or prescription medicines as directed by your health care provider.   Apply ice to the injured area.   Put ice in a plastic bag.   Place a towel between your skin and the bag.   Leave the ice on for 20 minutes, 2-3 times a day.  SEEK MEDICAL CARE IF:  You have bite problems.   You have pain with chewing.   You are concerned about facial defects. SEEK IMMEDIATE MEDICAL CARE IF:  You have severe pain or a headache that is not relieved by medicine.   You have unusual sleepiness, confusion, or personality changes.   You throw up (vomit).   You have a persistent nosebleed.   You have double vision or blurred vision.   You have fluid drainage from your nose or ear.   You have difficulty walking or using your arms or legs.  MAKE SURE YOU:   Understand these instructions.  Will watch your condition.  Will get help right away if you are not doing well or get worse.   This information is not intended to replace advice given to you by your health care provider. Make sure you discuss any questions you have with your health care provider.   Document Released: 01/21/2005 Document Revised: 01/04/2015 Document Reviewed: 07/27/2013 Elsevier Interactive Patient Education 2016 [place] Injury, Adult You have received a head injury. It does not appear  serious at this time. Headaches and vomiting are common following head injury. It should be easy to awaken from sleeping. Sometimes it is necessary for you to stay in the emergency department for a while for observation. Sometimes admission to the hospital may be needed. After injuries such as yours, most problems occur within the first 24 hours, but side effects may occur up to 7-10 days  after the injury. It is important for you to carefully monitor your condition and contact your health care provider or seek immediate medical care if there is a change in your condition. WHAT ARE THE TYPES OF HEAD INJURIES? Head injuries can be as minor as a bump. Some head injuries can be more severe. More severe head injuries include:  A jarring injury to the brain (concussion).  A bruise of the brain (contusion). This mean there is bleeding in the brain that can cause swelling.  A cracked skull (skull fracture).  Bleeding in the brain that collects, clots, and forms a bump (hematoma). WHAT CAUSES A HEAD INJURY? A serious head injury is most likely to happen to someone who is in a car wreck and is not wearing a seat belt. Other causes of major head injuries include bicycle or motorcycle accidents, sports injuries, and falls. HOW ARE HEAD INJURIES DIAGNOSED? A complete history of the event leading to the injury and your current symptoms will be helpful in diagnosing head injuries. Many times, pictures of the brain, such as CT or MRI are needed to see the extent of the injury. Often, an overnight hospital stay is necessary for observation.  WHEN SHOULD I SEEK IMMEDIATE MEDICAL CARE?  You should get help right away if:  You have confusion or drowsiness.  You feel sick to your stomach (nauseous) or have continued, forceful vomiting.  You have dizziness or unsteadiness that is getting worse.  You have severe, continued headaches not relieved by medicine. Only take over-the-counter or prescription medicines for  pain, fever, or discomfort as directed by your health care provider.  You do not have normal function of the arms or legs or are unable to walk.  You notice changes in the black spots in the center of the colored part of your eye (pupil).  You have a clear or bloody fluid coming from your nose or ears.  You have a loss of vision. During the next 24 hours after the injury, you must stay with someone who can watch you for the warning signs. This person should contact local emergency services (911 in the [place] if you have seizures, you become unconscious, or you are unable to wake up. HOW CAN I PREVENT A HEAD INJURY IN THE FUTURE? The most important factor for preventing major head injuries is avoiding motor vehicle accidents. To minimize the potential for damage to your head, it is crucial to wear seat belts while riding in motor vehicles. Wearing helmets while bike riding and playing collision sports (like football) is also helpful. Also, avoiding dangerous activities around the house will further help reduce your risk of head injury.  WHEN CAN I RETURN TO NORMAL ACTIVITIES AND ATHLETICS? You should be reevaluated by your health care provider before returning to these activities. If you have any of the following symptoms, you should not return to activities or contact sports until 1 week after the symptoms have stopped:  Persistent headache.  Dizziness or vertigo.  Poor attention and concentration.  Confusion.  Memory problems.  Nausea or vomiting.  Fatigue or tire easily.  Irritability.  Intolerant of bright lights or loud noises.  Anxiety or depression.  Disturbed sleep. MAKE SURE YOU:   Understand these instructions.  Will watch your condition.  Will get help right away if you are not doing well or get worse.   This information is not intended to replace advice given to you by your health care provider. Make sure you discuss any  questions you have with your health care  provider.   Document Released: 12/14/2005 Document Revised: 01/04/2015 Document Reviewed: 08/21/2013 Elsevier Interactive Patient Education 2016 [employer]  Post-Concussion Syndrome Post-concussion syndrome describes the symptoms that can occur after a head injury. These symptoms can last from weeks to months. CAUSES  It is not clear why some head injuries cause post-concussion syndrome. It can occur whether your head injury was mild or severe and whether you were wearing head protection or not.  SIGNS AND SYMPTOMS  Memory difficulties.  Dizziness.  Headaches.  Double vision or blurry vision.  Sensitivity to light.  Hearing difficulties.  Depression.  Tiredness.  Weakness.  Difficulty with concentration.  Difficulty sleeping or staying asleep.  Vomiting.  Poor balance or instability on your feet.  Slow reaction time.  Difficulty learning and remembering things you have heard. DIAGNOSIS  There is no test to determine whether you have post-concussion syndrome. Your health care provider may order an imaging scan of your brain, such as a CT scan, to check for other problems that may be causing your symptoms (such as a severe injury inside your skull). TREATMENT  Usually, these problems disappear over time without medical care. Your health care provider may prescribe medicine to help ease your symptoms. It is important to follow up with a neurologist to evaluate your recovery and address any lingering symptoms or issues. HOME CARE INSTRUCTIONS   Take medicines only as directed by your health care provider. Do not take aspirin. Aspirin can slow blood clotting.  Sleep with your head slightly elevated to help with headaches.  Avoid any situation where there is potential for another head injury. This includes football, hockey, soccer, basketball, martial arts, downhill snow sports, and horseback riding. Your condition will get worse every time you experience a concussion.  You should avoid these activities until you are evaluated by the appropriate follow-up health care providers.  Keep all follow-up visits as directed by your health care provider. This is important. SEEK MEDICAL CARE IF:  You have increased problems paying attention or concentrating.  You have increased difficulty remembering or learning new information.  You need more time to complete tasks or assignments than before.  You have increased irritability or decreased ability to cope with stress.  You have more symptoms than before. Seek medical care if you have any of the following symptoms for more than two weeks after your injury:  Lasting (chronic) headaches.  Dizziness or balance problems.  Nausea.  Vision problems.  Increased sensitivity to noise or light.  Depression or mood swings.  Anxiety or irritability.  Memory problems.  Difficulty concentrating or paying attention.  Sleep problems.  Feeling tired all the time. SEEK IMMEDIATE MEDICAL CARE IF:  You have confusion or unusual drowsiness.  Others find it difficult to wake you up.  You have nausea or persistent, forceful vomiting.  You feel like you are moving when you are not (vertigo). Your eyes may move rapidly back and forth.  You have convulsions or faint.  You have severe, persistent headaches that are not relieved by medicine.  You cannot use your arms or legs normally.  One of your pupils is larger than the other.  You have clear or bloody discharge from your nose or ears.  Your problems are getting worse, not better. MAKE SURE YOU:  Understand these instructions.  Will watch your condition.  Will get help right away if you are not doing well or get worse.   This information  is not intended to replace advice given to you by your health care provider. Make sure you discuss any questions you have with your health care provider.   Document Released: 06/05/2002 Document Revised: 01/04/2015  Document Reviewed: 03/21/2014 Elsevier Interactive Patient Education [employer].

## 2016-06-13 NOTE — ED Notes (Signed)
Intermittent headaches since ran into pole with head May 29th at work. Denies LOC at original injury. This headache began yesterday.

## 2016-06-13 NOTE — ED Provider Notes (Signed)
[facility] Emergency Department Provider Note  ____________________________________________  Time seen: Approximately 9:47 AM  I have reviewed the triage vital signs and the nursing notes.   HISTORY  Chief Complaint Headache    HPI Natalie Dickerson is a 55 y.o. female , NAD, presents to the emergency department with several week history of intermittent headaches. States on May 29 while she was working, she ran into a metal pole. States that she had pain about the area at the time but no swelling, dizziness, LOC. Felt nauseous for a couple of days after the incident but that has resolved. Has had intermittent headaches since the incident. Went to [facility] one week later for assessment in which x-rays were completed. Patient was told at the time of discharge that everything looked okay but was called later stating there was a possibility she had an orbital fracture but there was no treatment for such and it would heal on its own. Patient does note that she had mild bruising about the lateral aspect of the left orbital area. Never had black or any hematomas. Notes that her headaches feel like pressure about the left side of her head about the temporal region. Has not had any changes in vision, tinnitus, changes in hearing nor ear pain. Denies any other traumas or injuries to the face or neck. Notes that occasionally she has a "tic" in which her head turns to the left. States this is rare and quick in nature. Has not had any neck pain nor any back pain. Denies numbness, weakness, tingling. Has not had any changes in her speech or gait. Did take 2 extra strength Tylenol last night for her headache without any resolution of symptoms.   History reviewed. No pertinent past medical history.  Patient Active Problem List   Diagnosis Date Noted  . Avitaminosis D 10/25/2015  . B12 deficiency 10/25/2015  . History of chicken pox 10/22/2015  . Uterine fibroid 10/22/2015  .  Basal cell carcinoma of face 09/27/2013  . Cervical pain 07/19/2008  . Irregular bleeding 07/19/2008  . Candidal vulvovaginitis 03/01/2008  . High risk sexual behavior 05/27/2007    Past Surgical History  Procedure Laterality Date  . Finger surgery      Current Outpatient Rx  Name  Route  Sig  Dispense  Refill  . Cholecalciferol (VITAMIN D-3) 1000 UNITS CAPS   Oral   Take 1 capsule by mouth daily.         . Cyanocobalamin (VITAMIN B12 PO)   Oral   Take by mouth daily.         Randell Loop Prim-Borage (FLAX OIL XTRA) CAPS   Oral   Take by mouth.         . Multiple Vitamin tablet   Oral   Take by mouth.           Allergies Review of patient's allergies indicates no known allergies.  Family History  Problem Relation Age of Onset  . Hypertension Mother   . Cancer Mother     Breast Cancer    Social History Social History  Substance Use Topics  . Smoking status: Former Research scientist (life sciences)  . Smokeless tobacco: None     Comment: Smoked for about 5 years, smoked about 1 pack per day. Quit in 1984  . Alcohol Use: Yes     Comment: Occasional alcohol use; Drinks 2-4 glasses of wine per week.     Review of Systems  Constitutional: No fever/chills, fatigue Eyes:  No visual changes. No discharge, redness, pain, swelling ENT: No sore throat, tinnitus, ear pain, jaw pain. Cardiovascular: No chest pain. Respiratory: No shortness of breath. No wheezing.  Gastrointestinal: Positive nausea that has resolved. No abdominal pain.  No vomiting.   Musculoskeletal: Negative for back or neck pain.  Skin: Positive bruising that has resolved. Negative for rash, redness, swelling, open wounds or lacerations. Neurological: "Tic" about neck. Lightheaded ness today. Positive for headaches, but no focal weakness or numbness. No saddle paraesthesias, tingling. No changes in speech or gait.  10-point ROS otherwise negative.  ____________________________________________   PHYSICAL  EXAM:  VITAL SIGNS: ED Triage Vitals  Enc Vitals Group     BP --      Pulse --      Resp --      Temp --      Temp src --      SpO2 --      Weight --      Height --      Head Cir --      Peak Flow --      Pain Score --      Pain Loc --      Pain Edu? --      Excl. in [place]? --      Constitutional: Alert and oriented. Well appearing and in no acute distress. Eyes: Conjunctivae are normal. PERRLA. EOMI without pain. No pain to palpation about both eyes closed. Head: Atraumatic. ENT:      Ears: TMs visualized bilaterally without erythema, bulging, effusion, perforation.       Nose: No congestion/rhinnorhea.      Mouth/Throat: Mucous membranes are moist.  Neck: Supple with FROM. No cervical spine tenderness to palpation. No trapezial muscle spasms.  Hematological/Lymphatic/Immunilogical: No cervical lymphadenopathy. Cardiovascular: Normal rate, regular rhythm. Normal S1 and S2.  Good peripheral circulation with 2+ pulses in bilateral upper and lower extremities. Respiratory: Normal respiratory effort without tachypnea or retractions. Lungs CTAB with breath sounds noted in all lung fields.  Musculoskeletal: No lower extremity tenderness nor edema.  No joint effusions. Neurologic:  Normal speech and language. No gross focal neurologic deficits are appreciated. No tics or tremors. CN III-XII grossly in tact. Sensation to light touch grossly in tact about bilateral upper and lower extremities.  Skin:  Skin is warm, dry and intact. No rash, bruising, open wounds, redness, swelling noted. Psychiatric: Mood and affect are normal. Speech and behavior are normal. Patient exhibits appropriate insight and judgement.   ____________________________________________   LABS  None ____________________________________________  EKG  None ____________________________________________  RADIOLOGY I have personally viewed and evaluated these images (plain radiographs) as part of my medical  decision making, as well as reviewing the written report by the radiologist.  Ct Head Wo Contrast  06/13/2016  CLINICAL DATA:  Head/facial injury 05/25/2016 with persistent intermittent headaches and neck pain. EXAM: CT HEAD WITHOUT CONTRAST CT MAXILLOFACIAL WITHOUT CONTRAST CT CERVICAL SPINE WITHOUT CONTRAST TECHNIQUE: Multidetector CT imaging of the head, cervical spine, and maxillofacial structures were performed using the standard protocol without intravenous contrast. Multiplanar CT image reconstructions of the cervical spine and maxillofacial structures were also generated. COMPARISON:  None. FINDINGS: CT HEAD FINDINGS No evidence of parenchymal hemorrhage or extra-axial fluid collection. No mass lesion, mass effect, or midline shift. No CT evidence of acute infarction. Cerebral volume is age appropriate. No ventriculomegaly. The visualized paranasal sinuses are essentially clear. The mastoid air cells are unopacified. No evidence of calvarial fracture. CT MAXILLOFACIAL FINDINGS  No maxillofacial fracture. The maxilla and mandible appear intact. The nasal bones are unremarkable in appearance. No dislocation at the temporomandibular joints. The visualized dentition demonstrates no acute abnormality. The orbits are intact bilaterally. The visualized paranasal sinuses are essentially clear. The visualized mastoid air cells are unopacified. No aggressive appearing focal osseous lesions. No extra-axial collections, intracranial hemorrhage or mass effect in the visualized intracranial regions. The parapharyngeal fat planes are preserved. The nasopharynx, oropharynx and hypopharynx are unremarkable in appearance. The parotid and submandibular glands are within normal limits. No cervical lymphadenopathy is seen. CT CERVICAL SPINE FINDINGS No fracture is detected in the cervical spine. No prevertebral soft tissue swelling. There is straightening of the cervical spine, usually due to positioning and/or muscle spasm.  Dens is well positioned between the lateral masses of C1. The lateral masses appear well-aligned. Moderate degenerative disease in the mid to lower cervical spine. Mild-to-moderate facet arthropathy, asymmetric to the right. Mild foraminal stenosis on the right at C3-4. Mild-to-moderate foraminal stenosis on the left at C4-5. No cervical spine subluxation. Nonspecific 0.6 cm lytic lesion in the posterior C3 vertebral body with narrow zone of transition. Visualized mastoid air cells appear clear. No evidence of intra-axial hemorrhage in the visualized brain. No gross cervical canal hematoma. Scattered calcified subcentimeter granulomas of both lung apices. No significant noncalcified pulmonary nodules at the visualized lung apices. No cervical adenopathy or other significant neck soft tissue abnormality. IMPRESSION: 1. Negative head CT. No evidence of acute intracranial abnormality. No evidence of calvarial fracture. 2. No cervical spine fracture or subluxation. 3. Moderate degenerative changes in the mid to lower cervical spine as described. 4. Nonspecific subcentimeter lytic lesion in the posterior C3 vertebral body without overtly aggressive features. 5. No maxillofacial fracture . Electronically Signed   By: Ilona Sorrel M.D.   On: 06/13/2016 11:00   Ct Cervical Spine Wo Contrast  06/13/2016  CLINICAL DATA:  Head/facial injury 05/25/2016 with persistent intermittent headaches and neck pain. EXAM: CT HEAD WITHOUT CONTRAST CT MAXILLOFACIAL WITHOUT CONTRAST CT CERVICAL SPINE WITHOUT CONTRAST TECHNIQUE: Multidetector CT imaging of the head, cervical spine, and maxillofacial structures were performed using the standard protocol without intravenous contrast. Multiplanar CT image reconstructions of the cervical spine and maxillofacial structures were also generated. COMPARISON:  None. FINDINGS: CT HEAD FINDINGS No evidence of parenchymal hemorrhage or extra-axial fluid collection. No mass lesion, mass effect, or  midline shift. No CT evidence of acute infarction. Cerebral volume is age appropriate. No ventriculomegaly. The visualized paranasal sinuses are essentially clear. The mastoid air cells are unopacified. No evidence of calvarial fracture. CT MAXILLOFACIAL FINDINGS No maxillofacial fracture. The maxilla and mandible appear intact. The nasal bones are unremarkable in appearance. No dislocation at the temporomandibular joints. The visualized dentition demonstrates no acute abnormality. The orbits are intact bilaterally. The visualized paranasal sinuses are essentially clear. The visualized mastoid air cells are unopacified. No aggressive appearing focal osseous lesions. No extra-axial collections, intracranial hemorrhage or mass effect in the visualized intracranial regions. The parapharyngeal fat planes are preserved. The nasopharynx, oropharynx and hypopharynx are unremarkable in appearance. The parotid and submandibular glands are within normal limits. No cervical lymphadenopathy is seen. CT CERVICAL SPINE FINDINGS No fracture is detected in the cervical spine. No prevertebral soft tissue swelling. There is straightening of the cervical spine, usually due to positioning and/or muscle spasm. Dens is well positioned between the lateral masses of C1. The lateral masses appear well-aligned. Moderate degenerative disease in the mid to lower cervical spine. Mild-to-moderate facet arthropathy,  asymmetric to the right. Mild foraminal stenosis on the right at C3-4. Mild-to-moderate foraminal stenosis on the left at C4-5. No cervical spine subluxation. Nonspecific 0.6 cm lytic lesion in the posterior C3 vertebral body with narrow zone of transition. Visualized mastoid air cells appear clear. No evidence of intra-axial hemorrhage in the visualized brain. No gross cervical canal hematoma. Scattered calcified subcentimeter granulomas of both lung apices. No significant noncalcified pulmonary nodules at the visualized lung apices.  No cervical adenopathy or other significant neck soft tissue abnormality. IMPRESSION: 1. Negative head CT. No evidence of acute intracranial abnormality. No evidence of calvarial fracture. 2. No cervical spine fracture or subluxation. 3. Moderate degenerative changes in the mid to lower cervical spine as described. 4. Nonspecific subcentimeter lytic lesion in the posterior C3 vertebral body without overtly aggressive features. 5. No maxillofacial fracture . Electronically Signed   By: Ilona Sorrel M.D.   On: 06/13/2016 11:00   Ct Maxillofacial Wo Cm  06/13/2016  CLINICAL DATA:  Head/facial injury 05/25/2016 with persistent intermittent headaches and neck pain. EXAM: CT HEAD WITHOUT CONTRAST CT MAXILLOFACIAL WITHOUT CONTRAST CT CERVICAL SPINE WITHOUT CONTRAST TECHNIQUE: Multidetector CT imaging of the head, cervical spine, and maxillofacial structures were performed using the standard protocol without intravenous contrast. Multiplanar CT image reconstructions of the cervical spine and maxillofacial structures were also generated. COMPARISON:  None. FINDINGS: CT HEAD FINDINGS No evidence of parenchymal hemorrhage or extra-axial fluid collection. No mass lesion, mass effect, or midline shift. No CT evidence of acute infarction. Cerebral volume is age appropriate. No ventriculomegaly. The visualized paranasal sinuses are essentially clear. The mastoid air cells are unopacified. No evidence of calvarial fracture. CT MAXILLOFACIAL FINDINGS No maxillofacial fracture. The maxilla and mandible appear intact. The nasal bones are unremarkable in appearance. No dislocation at the temporomandibular joints. The visualized dentition demonstrates no acute abnormality. The orbits are intact bilaterally. The visualized paranasal sinuses are essentially clear. The visualized mastoid air cells are unopacified. No aggressive appearing focal osseous lesions. No extra-axial collections, intracranial hemorrhage or mass effect in the  visualized intracranial regions. The parapharyngeal fat planes are preserved. The nasopharynx, oropharynx and hypopharynx are unremarkable in appearance. The parotid and submandibular glands are within normal limits. No cervical lymphadenopathy is seen. CT CERVICAL SPINE FINDINGS No fracture is detected in the cervical spine. No prevertebral soft tissue swelling. There is straightening of the cervical spine, usually due to positioning and/or muscle spasm. Dens is well positioned between the lateral masses of C1. The lateral masses appear well-aligned. Moderate degenerative disease in the mid to lower cervical spine. Mild-to-moderate facet arthropathy, asymmetric to the right. Mild foraminal stenosis on the right at C3-4. Mild-to-moderate foraminal stenosis on the left at C4-5. No cervical spine subluxation. Nonspecific 0.6 cm lytic lesion in the posterior C3 vertebral body with narrow zone of transition. Visualized mastoid air cells appear clear. No evidence of intra-axial hemorrhage in the visualized brain. No gross cervical canal hematoma. Scattered calcified subcentimeter granulomas of both lung apices. No significant noncalcified pulmonary nodules at the visualized lung apices. No cervical adenopathy or other significant neck soft tissue abnormality. IMPRESSION: 1. Negative head CT. No evidence of acute intracranial abnormality. No evidence of calvarial fracture. 2. No cervical spine fracture or subluxation. 3. Moderate degenerative changes in the mid to lower cervical spine as described. 4. Nonspecific subcentimeter lytic lesion in the posterior C3 vertebral body without overtly aggressive features. 5. No maxillofacial fracture . Electronically Signed   By: Ilona Sorrel M.D.   On:  06/13/2016 11:00    ____________________________________________    PROCEDURES  Procedure(s) performed: None    Medications - No data to display   ____________________________________________   INITIAL IMPRESSION /  ASSESSMENT AND PLAN / ED COURSE  Pertinent imaging results that were available during my care of the patient were reviewed by me and considered in my medical decision making (see chart for details). All imaging results were discussed with patient and all questions answered to her satisfaction.  Patient's diagnosis is consistent with pressure-like headaches due to head injury and head contusion, postconcussive syndrome. Patient will be discharged home with exit care at outlining detailed instructions for home care. May continue Tylenol as needed for headaches and may try over-the-counter Excedrin Migraine. Patient is to follow up with neurology for recheck and further evaluation of headaches and the patient's reported tick. Also advised patient to follow up with orthopedics in regards to incidental finding of the lytic lesion on C3.  Patient is given ED precautions to return to the ED for any worsening or new symptoms.    ____________________________________________  FINAL CLINICAL IMPRESSION(S) / ED DIAGNOSES  Final diagnoses:  Post-traumatic headache, not intractable, unspecified chronicity pattern  Head contusion, initial encounter  Post concussive syndrome  Head injury, initial encounter      NEW MEDICATIONS STARTED DURING THIS VISIT:  New Prescriptions   No medications on file         Braxton Feathers, PA-C 06/13/16 [id number]  Delman Kitten, MD 06/13/16 1526

## 2016-06-23 ENCOUNTER — Other Ambulatory Visit: Payer: Self-pay

## 2016-06-23 ENCOUNTER — Encounter: Payer: Self-pay | Admitting: Emergency Medicine

## 2016-06-23 ENCOUNTER — Emergency Department
Admission: EM | Admit: 2016-06-23 | Discharge: 2016-06-23 | Disposition: A | Discharge status: Home / Self Care | Payer: Worker's Compensation | Attending: Emergency Medicine | Admitting: Emergency Medicine

## 2016-06-23 DIAGNOSIS — R51 Headache: Secondary | ICD-10-CM | POA: Insufficient documentation

## 2016-06-23 DIAGNOSIS — Z79899 Other long term (current) drug therapy: Secondary | ICD-10-CM | POA: Insufficient documentation

## 2016-06-23 DIAGNOSIS — Z87891 Personal history of nicotine dependence: Secondary | ICD-10-CM | POA: Diagnosis not present

## 2016-06-23 DIAGNOSIS — Z85828 Personal history of other malignant neoplasm of skin: Secondary | ICD-10-CM | POA: Diagnosis not present

## 2016-06-23 DIAGNOSIS — F0781 Postconcussional syndrome: Secondary | ICD-10-CM

## 2016-06-23 DIAGNOSIS — R42 Dizziness and giddiness: Secondary | ICD-10-CM | POA: Diagnosis not present

## 2016-06-23 LAB — URINALYSIS COMPLETE WITH MICROSCOPIC (ARMC ONLY)
Bacteria, UA: NONE SEEN
Bilirubin Urine: NEGATIVE
Glucose, UA: NEGATIVE mg/dL
Hgb urine dipstick: NEGATIVE
Ketones, ur: NEGATIVE mg/dL
Leukocytes, UA: NEGATIVE
Nitrite: NEGATIVE
Protein, ur: NEGATIVE mg/dL
Specific Gravity, Urine: 1.006 (ref 1.005–1.030)
WBC, UA: NONE SEEN WBC/hpf (ref 0–5)
pH: 7 (ref 5.0–8.0)

## 2016-06-23 LAB — CBC
HCT: 43.7 % (ref 35.0–47.0)
Hemoglobin: 15.3 g/dL (ref 12.0–16.0)
MCH: 31 pg (ref 26.0–34.0)
MCHC: 35 g/dL (ref 32.0–36.0)
MCV: 88.6 fL (ref 80.0–100.0)
Platelets: 274 10*3/uL (ref 150–440)
RBC: 4.93 MIL/uL (ref 3.80–5.20)
RDW: 12.4 % (ref 11.5–14.5)
WBC: 5 10*3/uL (ref 3.6–11.0)

## 2016-06-23 LAB — BASIC METABOLIC PANEL
Anion gap: 8 (ref 5–15)
BUN: 12 mg/dL (ref 6–20)
CO2: 27 mmol/L (ref 22–32)
Calcium: 10 mg/dL (ref 8.9–10.3)
Chloride: 104 mmol/L (ref 101–111)
Creatinine, Ser: 0.66 mg/dL (ref 0.44–1.00)
GFR calc Af Amer: >60 mL/min (ref >60)
GFR calc non Af Amer: >60 mL/min (ref >60)
Glucose, Bld: 96 mg/dL (ref 65–99)
Potassium: 4 mmol/L (ref 3.5–5.1)
Sodium: 139 mmol/L (ref 135–145)

## 2016-06-23 NOTE — ED Notes (Signed)
Pt to ED today with headache, dizziness, sensitive to sound, ringing in ears and reports of having fast heart beat at times.  Pt walked into a pole at work on May 29 and was told she had a small left occipital bone fracture. Pt tearful in triage.

## 2016-06-23 NOTE — ED Provider Notes (Signed)
[facility] Emergency Department Provider Note   ____________________________________________  Time seen: Approximately 11:45 am I have reviewed the triage vital signs and the triage nursing note.  HISTORY  Chief Complaint Dizziness and Headache   Historian Patient  HPI Natalie Dickerson is a 55 y.o. female is here for evaluation for ongoing feeling of lightheadedness/dizziness since she struck the left side of her head walking into a pole at work about 1 month ago, 05/25/16. She was initially seen a few days later and had x-rays which showed a possible fracture it sounds like, but then she was seen 10 days ago in our emergency department and had a CT scan of her head, face, and C-spine which showed no traumatic injury including no fractures and no intracranial findings.  She is being followed by [employer] for this injury that did occur at work. She's had persistent intermittent headaches which are located by temporal areas. She's had a feeling of dizziness which she describes as lightheaded. She's had no focal weakness or numbness. She's had no confusion or altered mental status. She does report emotional changes, easy to cry, multiple episodes of crying. She states that she feels hypervigilant especially at work.  She's had no problems with coordination.  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  She states at work she will get anxious and feel lightheaded and heart racing.  At times when she is lying down she will notice her neck muscles twitch.    History reviewed. No pertinent past medical history.  Patient Active Problem List   Diagnosis Date Noted  . Avitaminosis D 10/25/2015  . B12 deficiency 10/25/2015  . History of chicken pox 10/22/2015  . Uterine fibroid 10/22/2015  . Basal cell carcinoma of face 09/27/2013  . Cervical pain 07/19/2008  . Irregular bleeding 07/19/2008  . Candidal vulvovaginitis  03/01/2008  . High risk sexual behavior 05/27/2007    Past Surgical History  Procedure Laterality Date  . Finger surgery      Current Outpatient Rx  Name  Route  Sig  Dispense  Refill  . Cholecalciferol (VITAMIN D-3) 1000 UNITS CAPS   Oral   Take 1 capsule by mouth daily.         . Cyanocobalamin (VITAMIN B12 PO)   Oral   Take by mouth daily.         Randell Loop Prim-Borage (FLAX OIL XTRA) CAPS   Oral   Take by mouth.         . Multiple Vitamin tablet   Oral   Take by mouth.           Allergies Review of patient's allergies indicates no known allergies.  Family History  Problem Relation Age of Onset  . Hypertension Mother   . Cancer Mother     Breast Cancer    Social History Social History  Substance Use Topics  . Smoking status: Former Research scientist (life sciences)  . Smokeless tobacco: None     Comment: Smoked for about 5 years, smoked about 1 pack per day. Quit in 1984  . Alcohol Use: Yes     Comment: Occasional alcohol use; Drinks 2-4 glasses of wine per week.    Review of Systems  Constitutional: Negative for fever. Eyes: Negative for visual changes. ENT: Negative for sore throat. Cardiovascular: Negative for chest pain. Respiratory: Negative for shortness of breath. Gastrointestinal: Negative for abdominal pain, vomiting and diarrhea. Genitourinary: Negative for dysuria. Musculoskeletal: Negative for back  pain. Skin: Negative for rash. Neurological: No current headache, but persistent intermittent headaches. 10 point Review of Systems otherwise negative ____________________________________________   PHYSICAL EXAM:  VITAL SIGNS: ED Triage Vitals  Enc Vitals Group     BP 06/23/16 1051 147/81 mmHg     Pulse Rate 06/23/16 1051 76     Resp 06/23/16 1051 18     Temp 06/23/16 1051 98.2 F (36.8 C)     Temp Source 06/23/16 1051 Oral     SpO2 06/23/16 1051 99 %     Weight 06/23/16 1051 134 lb (60.782 kg)     Height 06/23/16 1051 5\' 3"  (1.6 m)     Head  Cir --      Peak Flow --      Pain Score 06/23/16 1100 9     Pain Loc --      Pain Edu? --      Excl. in [place]? --      Constitutional: Alert and oriented. Well appearing and in no distress.  Somewhat anxious. HEENT   Head: Normocephalic and atraumatic.      Eyes: Conjunctivae are normal. PERRL. Normal extraocular movements.      Ears:         Nose: No congestion/rhinnorhea.   Mouth/Throat: Mucous membranes are moist.   Neck: No stridor. Cardiovascular/Chest: Normal rate, regular rhythm.  No murmurs, rubs, or gallops. Respiratory: Normal respiratory effort without tachypnea nor retractions. Breath sounds are clear and equal bilaterally. No wheezes/rales/rhonchi. Gastrointestinal: Soft. No distention, no guarding, no rebound. Nontender. Genitourinary/rectal:Deferred Musculoskeletal: Nontender with normal range of motion in all extremities. No joint effusions.  No lower extremity tenderness.  No edema. Neurologic:  Cranial nerves 2-10 intact.Normal speech and language. No gross or focal neurologic deficits are appreciated.  Normal finger to nose bilaterally.  Normal gait. Skin:  Skin is warm, dry and intact. No rash noted. Psychiatric: A bit anxious and almost tearful at times.  No SI/HI.  Speech and behavior are normal. Patient exhibits appropriate insight and judgment.  ____________________________________________   EKG I, Lisa Roca, MD, the attending physician have personally viewed and interpreted all ECGs.  65 bpm.  NSR.  Narrow qrs.  Nl st and t wave ____________________________________________  LABS (pertinent positives/negatives)  Labs Reviewed  URINALYSIS COMPLETEWITH MICROSCOPIC ([facility] ONLY) - Abnormal; Notable for the following:    Color, Urine STRAW (*)    APPearance CLEAR (*)    Squamous Epithelial / LPF 0-5 (*)    All other components within normal limits  BASIC METABOLIC PANEL  CBC    ____________________________________________  RADIOLOGY All  Xrays were viewed by me. Imaging interpreted by Radiologist.  None __________________________________________  PROCEDURES  Procedure(s) performed: None  Critical Care performed: None  ____________________________________________   ED COURSE / ASSESSMENT AND PLAN  Pertinent labs & imaging results that were available during my care of the patient were reviewed by me and considered in my medical decision making (see chart for details).   This patient is here with symptoms consistent still with postconcussive syndrome, having started after her head injury. She did have a complete imaging workup about 10 days ago which was negative.  I don't think anything else is going on causing the dizziness, especially accompanied also by intermittent headaches on the same side where she struck her head, and emotional lability.  I don't think that her dizziness is caused by a cardiac problem. Her EKG is normal. I'm not suspicious of an arrhythmia. Her screening laboratory studies  show normal white blood cell count, normal hemoglobin, normal BUN and creatinine, and normal electrolytes.  Her neurologic exam is intact now, I am not concerned for a new acute neurologic issue such as a stroke, and I'm not recommending additional imaging at this point in time.  I do think she most certainly needs to see a neurologist, and it asked her to go ahead and follow up with the [facility] clinic neurologist, without necessarily waiting simply for worker's compensation. She needs to have her health evaluated without unnecessary delay.  Additionally, I do think she is describing some post traumatic stress-type symptoms with the hypervigilance, anxiety, and tearfulness, and I think perhaps discussing with a mental health provider may be helpful.  I discussed several alternative type treatments to consider in addition, if she thinks they might be helpful.   CONSULTATIONS:   None   Patient / Family / Caregiver informed  of clinical course, medical decision-making process, and agree with plan.   I discussed return precautions, follow-up instructions, and discharged instructions with patient and/or family.   ___________________________________________   FINAL CLINICAL IMPRESSION(S) / ED DIAGNOSES   Final diagnoses:  Dizziness  Post-concussion syndrome              Note: This dictation was prepared with Dragon dictation. Any transcriptional errors that result from this process are unintentional   Lisa Roca, MD 06/23/16 1215

## 2016-06-23 NOTE — ED Notes (Signed)
8/10pain reported   "I feel like a metal rod is going from one side to the other side - I guess I should know not to walk into metal poles."

## 2016-06-23 NOTE — Discharge Instructions (Signed)
You were evaluated for persistent dizziness and headache after head injury/concussion several weeks ago and your exam and evaluation are reassuring in the ER today.  We discussed, I do think you should see a neurologist, for prolonged post concussive symptoms.  Also, due to possible post-traumatic stress symptoms and crying, I do recommend evaluation with a mental health provider, and [facility] number is provided.  Return to the emergency room for any worsening condition including worsening headache, confusion or altered mental status, fever, seizure, worsening dizziness or passing out, or any other symptoms concerning to you.   Post-Concussion Syndrome Post-concussion syndrome describes the symptoms that can occur after a head injury. These symptoms can last from weeks to months. CAUSES  It is not clear why some head injuries cause post-concussion syndrome. It can occur whether your head injury was mild or severe and whether you were wearing head protection or not.  SIGNS AND SYMPTOMS  Memory difficulties.  Dizziness.  Headaches.  Double vision or blurry vision.  Sensitivity to light.  Hearing difficulties.  Depression.  Tiredness.  Weakness.  Difficulty with concentration.  Difficulty sleeping or staying asleep.  Vomiting.  Poor balance or instability on your feet.  Slow reaction time.  Difficulty learning and remembering things you have heard. DIAGNOSIS  There is no test to determine whether you have post-concussion syndrome. Your health care provider may order an imaging scan of your brain, such as a CT scan, to check for other problems that may be causing your symptoms (such as a severe injury inside your skull). TREATMENT  Usually, these problems disappear over time without medical care. Your health care provider may prescribe medicine to help ease your symptoms. It is important to follow up with a neurologist to evaluate your recovery and address any lingering symptoms or  issues. HOME CARE INSTRUCTIONS   Take medicines only as directed by your health care provider. Do not take aspirin. Aspirin can slow blood clotting.  Sleep with your head slightly elevated to help with headaches.  Avoid any situation where there is potential for another head injury. This includes football, hockey, soccer, basketball, martial arts, downhill snow sports, and horseback riding. Your condition will get worse every time you experience a concussion. You should avoid these activities until you are evaluated by the appropriate follow-up health care providers.  Keep all follow-up visits as directed by your health care provider. This is important. SEEK MEDICAL CARE IF:  You have increased problems paying attention or concentrating.  You have increased difficulty remembering or learning new information.  You need more time to complete tasks or assignments than before.  You have increased irritability or decreased ability to cope with stress.  You have more symptoms than before. Seek medical care if you have any of the following symptoms for more than two weeks after your injury:  Lasting (chronic) headaches.  Dizziness or balance problems.  Nausea.  Vision problems.  Increased sensitivity to noise or light.  Depression or mood swings.  Anxiety or irritability.  Memory problems.  Difficulty concentrating or paying attention.  Sleep problems.  Feeling tired all the time. SEEK IMMEDIATE MEDICAL CARE IF:  You have confusion or unusual drowsiness.  Others find it difficult to wake you up.  You have nausea or persistent, forceful vomiting.  You feel like you are moving when you are not (vertigo). Your eyes may move rapidly back and forth.  You have convulsions or faint.  You have severe, persistent headaches that are not relieved by  medicine.  You cannot use your arms or legs normally.  One of your pupils is larger than the other.  You have clear or bloody  discharge from your nose or ears.  Your problems are getting worse, not better. MAKE SURE YOU:  Understand these instructions.  Will watch your condition.  Will get help right away if you are not doing well or get worse.   This information is not intended to replace advice given to you by your health care provider. Make sure you discuss any questions you have with your health care provider.   Document Released: 06/05/2002 Document Revised: 01/04/2015 Document Reviewed: 03/21/2014 Elsevier Interactive Patient Education [employer].

## 2016-06-24 DIAGNOSIS — M5030 Other cervical disc degeneration, unspecified cervical region: Secondary | ICD-10-CM | POA: Diagnosis not present

## 2016-06-24 DIAGNOSIS — S93412A Sprain of calcaneofibular ligament of left ankle, initial encounter: Secondary | ICD-10-CM | POA: Diagnosis not present

## 2016-07-12 ENCOUNTER — Encounter: Payer: Self-pay | Admitting: Emergency Medicine

## 2016-07-12 ENCOUNTER — Emergency Department
Admission: EM | Admit: 2016-07-12 | Discharge: 2016-07-12 | Disposition: A | Discharge status: Home / Self Care | Payer: Worker's Compensation | Attending: Emergency Medicine | Admitting: Emergency Medicine

## 2016-07-12 DIAGNOSIS — R51 Headache: Secondary | ICD-10-CM | POA: Diagnosis not present

## 2016-07-12 DIAGNOSIS — R519 Headache, unspecified: Secondary | ICD-10-CM

## 2016-07-12 DIAGNOSIS — Z87891 Personal history of nicotine dependence: Secondary | ICD-10-CM | POA: Insufficient documentation

## 2016-07-12 MED ORDER — GABAPENTIN 100 MG PO CAPS: 100.0000 mg | ORAL_CAPSULE | Freq: Three times a day (TID) | ORAL | Status: DC

## 2016-07-12 NOTE — ED Provider Notes (Signed)
[facility] Emergency Department Provider Note  Time seen: 1:56 PM  I have reviewed the triage vital signs and the nursing notes.   HISTORY  Chief Complaint Headache and Tinnitus    HPI Natalie Dickerson is a 55 y.o. female with no known past medical history who presents to the emergency department for continued headache, photophobia and tinnitus. According to the patient she ran into a metal pole at work approximately 2 months ago. Since she has been working with [employer]. to get to a neurologist but has been unable to do so. Patient has been seen by a physician 2-3 times in this time period. Patient was seen in the emergency department with negative CT scans of the head, neck, face. Patient continues with headache, states continued phonophobia with loud noises, and continued ringing in her ears especially at night. Patient denies any focal weakness or numbness, denies confusion or slurred speech. Denies fever. Patient states she called her Workmen's [employer]. provider and told her of the continued symptoms and they told her to go to the emergency department for evaluation.     History reviewed. No pertinent past medical history.  Patient Active Problem List   Diagnosis Date Noted  . Avitaminosis D 10/25/2015  . B12 deficiency 10/25/2015  . History of chicken pox 10/22/2015  . Uterine fibroid 10/22/2015  . Basal cell carcinoma of face 09/27/2013  . Cervical pain 07/19/2008  . Irregular bleeding 07/19/2008  . Candidal vulvovaginitis 03/01/2008  . High risk sexual behavior 05/27/2007    Past Surgical History  Procedure Laterality Date  . Finger surgery      Current Outpatient Rx  Name  Route  Sig  Dispense  Refill  . Cholecalciferol (VITAMIN D-3) 1000 UNITS CAPS   Oral   Take 1 capsule by mouth daily.         . Cyanocobalamin (VITAMIN B12 PO)   Oral   Take by mouth daily.         Randell Loop Prim-Borage (FLAX OIL XTRA) CAPS   Oral   Take  by mouth.         . Multiple Vitamin tablet   Oral   Take by mouth.           Allergies Review of patient's allergies indicates no known allergies.  Family History  Problem Relation Age of Onset  . Hypertension Mother   . Cancer Mother     Breast Cancer    Social History Social History  Substance Use Topics  . Smoking status: Former Research scientist (life sciences)  . Smokeless tobacco: None     Comment: Smoked for about 5 years, smoked about 1 pack per day. Quit in 1984  . Alcohol Use: Yes     Comment: Occasional alcohol use; Drinks 2-4 glasses of wine per week.    Review of Systems Constitutional: Negative for fever. Eyes: Negative for visual changes. Negative for photophobia Cardiovascular: Negative for chest pain. Respiratory: Negative for shortness of breath. Gastrointestinal: Negative for abdominal pain Musculoskeletal: Negative for back pain. Neurological: Continued moderate dull headache. Denies focal weakness or numbness. 10-point ROS otherwise negative.  ____________________________________________   PHYSICAL EXAM:  VITAL SIGNS: ED Triage Vitals  Enc Vitals Group     BP 07/12/16 1119 140/84 mmHg     Pulse Rate 07/12/16 1119 82     Resp 07/12/16 1119 18     Temp 07/12/16 1119 98.7 F (37.1 C)     Temp Source 07/12/16 1119 Oral  SpO2 07/12/16 1119 98 %     Weight 07/12/16 1119 134 lb (60.782 kg)     Height 07/12/16 1119 5\' 3"  (1.6 m)     Head Cir --      Peak Flow --      Pain Score 07/12/16 1119 5     Pain Loc --      Pain Edu? --      Excl. in [place]? --     Constitutional: Alert and oriented. Well appearing and in no distress. Eyes: Normal exam, PERRL ENT   Head: Normocephalic and atraumatic. No abnormalities identified. Normal tympanic membranes. No temporal tenderness.   Mouth/Throat: Mucous membranes are moist. Cardiovascular: Normal rate, regular rhythm. No murmur Respiratory: Normal respiratory effort without tachypnea nor retractions. Breath sounds  are clear Gastrointestinal: Soft and nontender. No distention.   Musculoskeletal: Nontender with normal range of motion in all extremities.  Neurologic:  Normal speech and language. No gross focal neurologic deficits. Equal grip strengths bilaterally, no pronator drift, cranial nerves intact. Skin:  Skin is warm, dry and intact.  Psychiatric: Mood and affect are normal.     INITIAL IMPRESSION / ASSESSMENT AND PLAN / ED COURSE  Pertinent labs & imaging results that were available during my care of the patient were reviewed by me and considered in my medical decision making (see chart for details).  I reviewed the patient's old CT scans, her CT scan of her head, maxillofacial and cervical spine are negative. Denies any traumatic incidents since then. States she tried taking Aleve for the headache without result. Patient states continued and dull pain especially to the left side of her head, with ringing in her ears and phonophobia. Patient has a normal neurologic exam, appears overall very well. We'll start the patient on a trial of gabapentin, have her follow-up with neurology, patient is agreeable to this plan.  ____________________________________________   FINAL CLINICAL IMPRESSION(S) / ED DIAGNOSES  Headache   Harvest Dark, MD 07/12/16 1359

## 2016-07-12 NOTE — ED Notes (Signed)
Pt here for WC issue from head injury she sustained on May 25, 2016.  Pt states she has been having problems since then with ringing in the ears, pain in left ear on the bone, pain in pressure points. Pt has a list of problems that she brought with her.  Unable to see a neurologist because there is not one to see her that takes WC claims.  Pt is ambulatory in triage. Pain in temples is 4-5/10. Pt taking Aleve but pain in temporal region has not changed since initial injury.  Pt stopped taking Aleve yesterday.  Denies any vision changes or numbness.

## 2016-07-12 NOTE — ED Notes (Signed)

## 2016-07-12 NOTE — Discharge Instructions (Signed)

## 2016-07-13 ENCOUNTER — Ambulatory Visit (INDEPENDENT_AMBULATORY_CARE_PROVIDER_SITE_OTHER): Payer: BLUE CROSS/BLUE SHIELD | Admitting: Family Medicine

## 2016-07-13 ENCOUNTER — Encounter: Payer: Self-pay | Admitting: Family Medicine

## 2016-07-13 VITALS — BP 94/66 | HR 75 | Temp 98.2°F | Resp 16 | Wt 137.0 lb

## 2016-07-13 DIAGNOSIS — S61012A Laceration without foreign body of left thumb without damage to nail, initial encounter: Secondary | ICD-10-CM

## 2016-07-13 DIAGNOSIS — Z23 Encounter for immunization: Secondary | ICD-10-CM | POA: Diagnosis not present

## 2016-07-13 DIAGNOSIS — S61002A Unspecified open wound of left thumb without damage to nail, initial encounter: Secondary | ICD-10-CM | POA: Diagnosis not present

## 2016-07-13 MED ORDER — CEPHALEXIN 500 MG PO CAPS: 500.0000 mg | ORAL_CAPSULE | Freq: Three times a day (TID) | ORAL | Status: AC

## 2016-07-13 NOTE — Progress Notes (Signed)
       [name] Female    DOB: Nov 01, 1961   55 y.o.   MRN: [id number] Visit Date: 07/13/2016  Today's Provider: Lelon Huh, MD   Chief Complaint  Patient presents with  . Finger Injury    cut on left thumb   Subjective:    HPI  Finger Injury:  Patient cut her thumb on her left hand yesterday while cutting cantaloupe. Patient has been applying Neosporin and cleaning the wound with peroxide.  She has been keeping the wound covered with a Bandage. Injury occurred yesterday evening at 4pm. Patient's last Tdap was 08/16/2008.      No Known Allergies Current Meds  Medication Sig  . Cholecalciferol (VITAMIN D-3) 1000 UNITS CAPS Take 1 capsule by mouth daily.  . Cyanocobalamin (VITAMIN B12 PO) Take by mouth daily.  Randell Loop Prim-Borage (FLAX OIL XTRA) CAPS Take by mouth.  . gabapentin (NEURONTIN) 100 MG capsule Take 1 capsule (100 mg total) by mouth 3 (three) times daily.  . Multiple Vitamin tablet Take by mouth.    Review of Systems  Constitutional: Negative for fever, chills, diaphoresis, appetite change and fatigue.  Respiratory: Negative for chest tightness and shortness of breath.   Cardiovascular: Negative for chest pain and palpitations.  Gastrointestinal: Negative for nausea, vomiting and abdominal pain.  Skin: Positive for wound.  Neurological: Negative for dizziness and weakness.    Social History  Substance Use Topics  . Smoking status: Former Research scientist (life sciences)  . Smokeless tobacco: Not on file     Comment: Smoked for about 5 years, smoked about 1 pack per day. Quit in 1984  . Alcohol Use: Yes     Comment: Occasional alcohol use; Drinks 2-4 glasses of wine per week.   Objective:   BP 94/66 mmHg  Pulse 75  Temp(Src) 98.2 F (36.8 C) (Oral)  Resp 16  Wt 137 lb (62.143 kg)  SpO2 96%  Physical Exam  About 1cm long shallow laceration left thumb just below and parallel to nail with scant purulent drainage.     Assessment & Plan:     1.  Laceration of thumb, left, initial encounter  - cephALEXin (KEFLEX) 500 MG capsule; Take 1 capsule (500 mg total) by mouth 3 (three) times daily.  Dispense: 15 capsule; Refill: 0 - Td : Tetanus/diphtheria >7yo Preservative  free        Lelon Huh, MD  [facility]

## 2016-07-16 ENCOUNTER — Ambulatory Visit (INDEPENDENT_AMBULATORY_CARE_PROVIDER_SITE_OTHER): Payer: BLUE CROSS/BLUE SHIELD | Admitting: Physician Assistant

## 2016-07-16 ENCOUNTER — Encounter: Payer: Self-pay | Admitting: Physician Assistant

## 2016-07-16 ENCOUNTER — Ambulatory Visit: Payer: Self-pay | Admitting: Physician Assistant

## 2016-07-16 ENCOUNTER — Emergency Department
Admission: EM | Admit: 2016-07-16 | Discharge: 2016-07-16 | Disposition: A | Discharge status: Home / Self Care | Payer: Worker's Compensation | Attending: Emergency Medicine | Admitting: Emergency Medicine

## 2016-07-16 ENCOUNTER — Encounter: Payer: Self-pay | Admitting: Emergency Medicine

## 2016-07-16 VITALS — BP 120/80 | HR 68 | Temp 98.3°F | Resp 16 | Wt 136.6 lb

## 2016-07-16 DIAGNOSIS — T148 Other injury of unspecified body region: Secondary | ICD-10-CM

## 2016-07-16 DIAGNOSIS — H9313 Tinnitus, bilateral: Secondary | ICD-10-CM | POA: Diagnosis not present

## 2016-07-16 DIAGNOSIS — F0781 Postconcussional syndrome: Secondary | ICD-10-CM | POA: Diagnosis not present

## 2016-07-16 DIAGNOSIS — Z87891 Personal history of nicotine dependence: Secondary | ICD-10-CM | POA: Diagnosis not present

## 2016-07-16 DIAGNOSIS — Z1239 Encounter for other screening for malignant neoplasm of breast: Secondary | ICD-10-CM

## 2016-07-16 DIAGNOSIS — Z85820 Personal history of malignant melanoma of skin: Secondary | ICD-10-CM | POA: Diagnosis not present

## 2016-07-16 DIAGNOSIS — IMO0002 Reserved for concepts with insufficient information to code with codable children: Secondary | ICD-10-CM

## 2016-07-16 MED ORDER — PREDNISONE 10 MG (21) PO TBPK: ORAL_TABLET | ORAL | Status: DC

## 2016-07-16 NOTE — Progress Notes (Signed)
[name] Female    DOB: [birth year]   55 y.o.   MRN: [id number] Visit Date: 07/16/2016  Today's Provider: [name], PA-C   Chief Complaint  Patient presents with  . Lesion on left thumb   Subjective:    HPI Patient is here today with c/o of a cut on the left thumb. She was seen for this on Monday by Dr. Caryn Section.   She also reports that she wants provider to know about all her problems that she has been having since she hit a poll May the 29th. She has had multiple trips to urgent care (because of worker's comp) and the ER. She has been awaiting an appt with neurology do to continued dizziness after the incident where she hit her head on a poll. She has also had continued tinnitus. She was evaluated today in the ER for the tinnitus. They are now trying to get her an appt through her case manager with Dr. Pryor Ochoa for hearing and vestibular testing.     No Known Allergies Current Meds  Medication Sig  . cephALEXin (KEFLEX) 500 MG capsule Take 1 capsule (500 mg total) by mouth 3 (three) times daily.  . Cholecalciferol (VITAMIN D-3) 1000 UNITS CAPS Take 1 capsule by mouth daily.  . Cyanocobalamin (VITAMIN B12 PO) Take by mouth daily.  Randell Loop Prim-Borage (FLAX OIL XTRA) CAPS Take by mouth.  . gabapentin (NEURONTIN) 100 MG capsule Take 1 capsule (100 mg total) by mouth 3 (three) times daily.  . Multiple Vitamin tablet Take by mouth.  . predniSONE (STERAPRED UNI-PAK 21 TAB) 10 MG (21) TBPK tablet Dispense steroid taper pack as directed    Review of Systems  Constitutional: Negative.   HENT: Positive for tinnitus. Negative for ear discharge, ear pain, hearing loss, rhinorrhea, sinus pressure and sneezing.   Respiratory: Negative for cough, chest tightness and shortness of breath.   Cardiovascular: Negative for chest pain, palpitations and leg swelling.  Neurological: Positive for dizziness. Negative for light-headedness.       Pressure on her Temporal  region bilaterally    Social History  Substance Use Topics  . Smoking status: Former Research scientist (life sciences)  . Smokeless tobacco: Not on file     Comment: Smoked for about 5 years, smoked about 1 pack per day. Quit in 1984  . Alcohol Use: Yes     Comment: Occasional alcohol use; Drinks 2-4 glasses of wine per week.   Objective:   BP 120/80 mmHg  Pulse 68  Temp(Src) 98.3 F (36.8 C) (Oral)  Resp 16  Wt 136 lb 9.6 oz (61.961 kg)  Physical Exam  Constitutional: She appears well-developed and well-nourished. No distress.  HENT:  Head: Normocephalic and atraumatic.  Right Ear: Hearing, tympanic membrane, external ear and ear canal normal.  Left Ear: Hearing, tympanic membrane, external ear and ear canal normal.  Nose: Nose normal.  Mouth/Throat: Uvula is midline, oropharynx is clear and moist and mucous membranes are normal. No oropharyngeal exudate.  Eyes: Conjunctivae are normal. Pupils are equal, round, and reactive to light. Right eye exhibits no discharge. Left eye exhibits no discharge. No scleral icterus.  Neck: Normal range of motion. Neck supple. No tracheal deviation present. No thyromegaly present.  Cardiovascular: Normal rate, regular rhythm and normal heart sounds.  Exam reveals no gallop and no friction rub.   No murmur heard. Pulmonary/Chest: Effort normal and breath sounds normal. No stridor. No respiratory distress. She has no wheezes. She  has no rales.  Lymphadenopathy:    She has no cervical adenopathy.  Skin: Skin is warm and dry. Laceration (on left thumb; healing; still erythematous, slightly tender to palpation, no warmth, no drainage) noted. She is not diaphoretic.  Psychiatric: She has a normal mood and affect. Her behavior is normal. Judgment and thought content normal.  Vitals reviewed.     Assessment & Plan:     1. Post concussion syndrome Worker's comp. Awaiting possible Neuro and ENT evals.  2. Breast cancer screening There is no family history of breast cancer.  She does perform regular self breast exams. Mammogram was ordered as below. Information for [facility] was given to patient so she may schedule her mammogram at her convenience. - MM Digital Screening; Future  3. Laceration Healing. Still on keflex. Advised to call if redness worsens, tenderness increases, if she notices purulent drainage, or if she develops fevers, chills, nausea or vomiting.       [name], PA-C  [facility]

## 2016-07-16 NOTE — Discharge Instructions (Signed)
Tinnitus  Tinnitus refers to hearing a sound when there is no actual source for that sound. This is often described as ringing in the ears. However, people with this condition may hear a variety of noises. A person may hear the sound in one ear or in both ears.   The sounds of tinnitus can be soft, loud, or somewhere in between. Tinnitus can last for a few seconds or can be constant for days. It may go away without treatment and come back at various times. When tinnitus is constant or happens often, it can lead to other problems, such as trouble sleeping and trouble concentrating.  Almost everyone experiences tinnitus at some point. Tinnitus that is long-lasting (chronic) or comes back often is a problem that may require medical attention.   CAUSES   The cause of tinnitus is often not known. In some cases, it can result from other problems or conditions, including:   · Exposure to loud noises from machinery, music, or other sources.  · Hearing loss.  · Ear or sinus infections.  · Earwax buildup.  · A foreign object in the ear.  · Use of certain medicines.  · Use of alcohol and caffeine.  · High blood pressure.  · Heart diseases.  · Anemia.  · Allergies.  · Meniere disease.  · Thyroid problems.  · Tumors.  · An enlarged part of a weakened blood vessel (aneurysm).  SYMPTOMS  The main symptom of tinnitus is hearing a sound when there is no source for that sound. It may sound like:   · Buzzing.  · Roaring.  · Ringing.  · Blowing air, similar to the sound heard when you listen to a seashell.  · Hissing.  · Whistling.  · Sizzling.  · Humming.  · Running water.  · A sustained musical note.  DIAGNOSIS   Tinnitus is diagnosed based on your symptoms. Your health care provider will do a physical exam. A comprehensive hearing exam (audiologic exam) will be done if your tinnitus:   · Affects only one ear (unilateral).  · Causes hearing difficulties.  · Lasts 6 months or longer.  You may also need to see a health care provider  who specializes in hearing disorders (audiologist). You may be asked to complete a questionnaire to determine the severity of your tinnitus. Tests may be done to help determine the cause and to rule out other conditions. These can include:  · Imaging studies of your head and brain, such as:    A CT scan.    An MRI.  · An imaging study of your blood vessels (angiogram).  TREATMENT   Treating an underlying medical condition can sometimes make tinnitus go away. If your tinnitus continues, other treatments may include:  · Medicines, such as certain antidepressants or sleeping aids.  · Sound generators to mask the tinnitus. These include:  ¨ Tabletop sound machines that play relaxing sounds to help you fall asleep.  ¨ Wearable devices that fit in your ear and play sounds or music.  ¨ A small device that uses headphones to deliver a signal embedded in music (acoustic neural stimulation). In time, this may change the pathways of your brain and make you less sensitive to tinnitus. This device is used for very severe cases when no other treatment is working.  · Therapy and counseling to help you manage the stress of living with tinnitus.  · Using hearing aids or cochlear implants, if your tinnitus is related to hearing   loss.  HOME CARE INSTRUCTIONS  · When possible, avoid being in loud places and being exposed to loud sounds.  · Wear hearing protection, such as earplugs, when you are exposed to loud noises.  · Do not take stimulants, such as nicotine, alcohol, or caffeine.  · Practice techniques for reducing stress, such as meditation, yoga, or deep breathing.  · Use a white noise machine, a humidifier, or other devices to mask the sound of tinnitus.  · Sleep with your head slightly raised. This may reduce the impact of tinnitus.  · Try to get plenty of rest each night.  SEEK MEDICAL CARE IF:  · You have tinnitus in just one ear.  · Your tinnitus continues for 3 weeks or longer without stopping.  · Home care measures are not  helping.  · You have tinnitus after a head injury.  · You have tinnitus along with any of the following:    Dizziness.    Loss of balance.    Nausea and vomiting.     This information is not intended to replace advice given to you by your health care provider. Make sure you discuss any questions you have with your health care provider.     Document Released: 12/14/2005 Document Revised: 01/04/2015 Document Reviewed: 05/16/2014  Elsevier Interactive Patient Education ©2016 Elsevier Inc.

## 2016-07-16 NOTE — ED Notes (Signed)
Patient initially injured at work on May 29th.  Patient has been seen through ED multiple times and returns today with c/o worsening ringing in ears and headache.  C/O pain to bilateral temples.  Has been referred to Neurology, but states she is unable to find a neurologist that accepts [facility] payment.  Patent's WC continues to send patient back to ED for evaluation.

## 2016-07-16 NOTE — ED Provider Notes (Signed)
[facility] Emergency Department Provider Note        Time seen: ----------------------------------------- 2:36 PM on 07/16/2016 -----------------------------------------    I have reviewed the triage vital signs and the nursing notes.   HISTORY  Chief Complaint Tinnitus    HPI Natalie Dickerson is a 55 y.o. female who presents to ER for persistent tinnitus since she was injured on May 29. Patient reports she been seen in the ER multiple times and returns today with worsening ringing in her ears with some residual headache. She complains of pain to both temples. She been referred to neurology but Workmen's Comp. has limited her follow-up options. She denies any recent illness. She has been taking gabapentin without improvement.   History reviewed. No pertinent past medical history.  Patient Active Problem List   Diagnosis Date Noted  . Avitaminosis D 10/25/2015  . B12 deficiency 10/25/2015  . History of chicken pox 10/22/2015  . Uterine fibroid 10/22/2015  . Basal cell carcinoma of face 09/27/2013  . Cervical pain 07/19/2008  . Irregular bleeding 07/19/2008  . Candidal vulvovaginitis 03/01/2008    Past Surgical History  Procedure Laterality Date  . Finger surgery      Allergies Review of patient's allergies indicates no known allergies.  Social History Social History  Substance Use Topics  . Smoking status: Former Research scientist (life sciences)  . Smokeless tobacco: None     Comment: Smoked for about 5 years, smoked about 1 pack per day. Quit in 1984  . Alcohol Use: Yes     Comment: Occasional alcohol use; Drinks 2-4 glasses of wine per week.    Review of Systems Constitutional: Negative for fever. Eyes: Negative for visual changes. ENT: Positive for ringing in the ears Cardiovascular: Negative for chest pain. Respiratory: Negative for shortness of breath. Gastrointestinal: Negative for abdominal pain, vomiting and diarrhea. Genitourinary: Negative for  dysuria. Musculoskeletal: Negative for back pain. Skin: Negative for rash. Neurological: Positive for headache  10-point ROS otherwise negative.  ____________________________________________   PHYSICAL EXAM:  VITAL SIGNS: ED Triage Vitals  Enc Vitals Group     BP 07/16/16 1139 148/83 mmHg     Pulse Rate 07/16/16 1139 86     Resp 07/16/16 1139 18     Temp 07/16/16 1139 98.1 F (36.7 C)     Temp Source 07/16/16 1139 Oral     SpO2 07/16/16 1139 96 %     Weight 07/16/16 1139 137 lb (62.143 kg)     Height 07/16/16 1139 5\' 3"  (1.6 m)     Head Cir --      Peak Flow --      Pain Score 07/16/16 1145 4     Pain Loc --      Pain Edu? --      Excl. in [place]? --     Constitutional: Alert and oriented. Well appearing and in no distress. Eyes: Conjunctivae are normal. PERRL. Normal extraocular movements. ENT   Head: Normocephalic and atraumatic.   Nose: No congestion/rhinnorhea.   Mouth/Throat: Mucous membranes are moist.   Neck: No stridor. Musculoskeletal: Nontender with normal range of motion in all extremities. No lower extremity tenderness nor edema. Neurologic:  Normal speech and language. No gross focal neurologic deficits are appreciated.  Skin:  Skin is warm, dry and intact. No rash noted. Psychiatric: Mood and affect are normal. Speech and behavior are normal.  ____________________________________________  ED COURSE:  Pertinent labs & imaging results that were available during my care of the patient  were reviewed by me and considered in my medical decision making (see chart for details). Patient was persistent tinnitus from a head injury. I will discuss with otolaryngology for further guidance.  ____________________________________________  FINAL ASSESSMENT AND PLAN  Tinnitus  Plan: Patient with persistent tinnitus seemingly from a concussion. Patient apparently with postconcussive symptoms. I discussed with ENT on call who recommends a steroid taper. She  will be referred to ENT for follow-up and audiology testing.   Earleen Newport, MD   Note: This dictation was prepared with Dragon dictation. Any transcriptional errors that result from this process are unintentional   Earleen Newport, MD 07/16/16 [phone]

## 2016-07-16 NOTE — Patient Instructions (Signed)
Post-Concussion Syndrome  Post-concussion syndrome describes the symptoms that can occur after a head injury. These symptoms can last from weeks to months.  CAUSES   It is not clear why some head injuries cause post-concussion syndrome. It can occur whether your head injury was mild or severe and whether you were wearing head protection or not.   SIGNS AND SYMPTOMS  · Memory difficulties.  · Dizziness.  · Headaches.  · Double vision or blurry vision.  · Sensitivity to light.  · Hearing difficulties.  · Depression.  · Tiredness.  · Weakness.  · Difficulty with concentration.  · Difficulty sleeping or staying asleep.  · Vomiting.  · Poor balance or instability on your feet.  · Slow reaction time.  · Difficulty learning and remembering things you have heard.  DIAGNOSIS   There is no test to determine whether you have post-concussion syndrome. Your health care provider may order an imaging scan of your brain, such as a CT scan, to check for other problems that may be causing your symptoms (such as a severe injury inside your skull).  TREATMENT   Usually, these problems disappear over time without medical care. Your health care provider may prescribe medicine to help ease your symptoms. It is important to follow up with a neurologist to evaluate your recovery and address any lingering symptoms or issues.  HOME CARE INSTRUCTIONS   · Take medicines only as directed by your health care provider. Do not take aspirin. Aspirin can slow blood clotting.  · Sleep with your head slightly elevated to help with headaches.  · Avoid any situation where there is potential for another head injury. This includes football, hockey, soccer, basketball, martial arts, downhill snow sports, and horseback riding. Your condition will get worse every time you experience a concussion. You should avoid these activities until you are evaluated by the appropriate follow-up health care providers.  · Keep all follow-up visits as directed by your health  care provider. This is important.  SEEK MEDICAL CARE IF:  · You have increased problems paying attention or concentrating.  · You have increased difficulty remembering or learning new information.  · You need more time to complete tasks or assignments than before.  · You have increased irritability or decreased ability to cope with stress.  · You have more symptoms than before.  Seek medical care if you have any of the following symptoms for more than two weeks after your injury:  · Lasting (chronic) headaches.  · Dizziness or balance problems.  · Nausea.  · Vision problems.  · Increased sensitivity to noise or light.  · Depression or mood swings.  · Anxiety or irritability.  · Memory problems.  · Difficulty concentrating or paying attention.  · Sleep problems.  · Feeling tired all the time.  SEEK IMMEDIATE MEDICAL CARE IF:  · You have confusion or unusual drowsiness.  · Others find it difficult to wake you up.  · You have nausea or persistent, forceful vomiting.  · You feel like you are moving when you are not (vertigo). Your eyes may move rapidly back and forth.  · You have convulsions or faint.  · You have severe, persistent headaches that are not relieved by medicine.  · You cannot use your arms or legs normally.  · One of your pupils is larger than the other.  · You have clear or bloody discharge from your nose or ears.  · Your problems are getting worse, not better.  MAKE   SURE YOU:  · Understand these instructions.  · Will watch your condition.  · Will get help right away if you are not doing well or get worse.     This information is not intended to replace advice given to you by your health care provider. Make sure you discuss any questions you have with your health care provider.     Document Released: 06/05/2002 Document Revised: 01/04/2015 Document Reviewed: 03/21/2014  Elsevier Interactive Patient Education ©2016 Elsevier Inc.

## 2016-07-27 ENCOUNTER — Ambulatory Visit: Payer: Self-pay | Admitting: Physician Assistant

## 2016-07-30 ENCOUNTER — Encounter: Payer: Self-pay | Admitting: Physician Assistant

## 2016-07-30 ENCOUNTER — Ambulatory Visit (INDEPENDENT_AMBULATORY_CARE_PROVIDER_SITE_OTHER): Payer: BLUE CROSS/BLUE SHIELD | Admitting: Physician Assistant

## 2016-07-30 VITALS — BP 118/80 | HR 81 | Temp 98.1°F | Resp 16

## 2016-07-30 DIAGNOSIS — M25572 Pain in left ankle and joints of left foot: Secondary | ICD-10-CM

## 2016-07-30 DIAGNOSIS — M25372 Other instability, left ankle: Secondary | ICD-10-CM | POA: Diagnosis not present

## 2016-07-30 DIAGNOSIS — M25472 Effusion, left ankle: Secondary | ICD-10-CM | POA: Diagnosis not present

## 2016-07-30 NOTE — Progress Notes (Signed)
[name] Female    DOB: 1961-06-17   55 y.o.   MRN: [id number] Visit Date: 07/30/2016  Today's Provider: [name], PA-C   Chief Complaint  Patient presents with  . Joint Swelling   Subjective:    HPI Ankle Pain: Patient complains of swollen left ankle/pain.  Onset of the symptoms was several days ago (on Monday) she did a step class and it just became swollen and tender under lateral malleolus. Inciting event: none known. Current symptoms include pain with weight bearing, swelling, and discoloration.  Aggravating symptoms: weight bearing and dependent position. Patient's overall course: gradually improving. Patient has had prior ankle problems and was seen by Ortho on June 24, 2016 with xray.  Evaluation to date: ortho (Dr. Mack Guise) on June 28 with xray that showed well circumscribed calcification at the tip of left lateral malleolus c/w possible old sprain vs healed avulsion fracture.  Treatment to date: anti-inflammatories (aleve), compression, rest and elevation.      No Known Allergies Current Meds  Medication Sig  . Cholecalciferol (VITAMIN D-3) 1000 UNITS CAPS Take 1 capsule by mouth daily.  . Cyanocobalamin (VITAMIN B12 PO) Take by mouth daily.  Randell Loop Prim-Borage (FLAX OIL XTRA) CAPS Take by mouth.  . gabapentin (NEURONTIN) 100 MG capsule Take 1 capsule (100 mg total) by mouth 3 (three) times daily.  . Multiple Vitamin tablet Take by mouth.    Review of Systems  Constitutional: Negative.   Respiratory: Negative.   Cardiovascular: Positive for palpitations and leg swelling. Negative for chest pain.  Gastrointestinal: Negative.   Musculoskeletal: Positive for arthralgias and joint swelling. Negative for gait problem.  Neurological: Positive for headaches. Negative for dizziness.    Social History  Substance Use Topics  . Smoking status: Former Research scientist (life sciences)  . Smokeless tobacco: Never Used     Comment: Smoked for about 5 years, smoked  about 1 pack per day. Quit in 1984  . Alcohol use Yes     Comment: Occasional alcohol use; Drinks 2-4 glasses of wine per week.   Objective:   BP 118/80 (BP Location: Left Arm, Patient Position: Sitting, Cuff Size: Normal)   Pulse 81   Temp 98.1 F (36.7 C) (Oral)   Resp 16   Physical Exam  Constitutional: She appears well-developed and well-nourished. No distress.  Cardiovascular: Normal rate, regular rhythm and normal heart sounds.  Exam reveals no gallop and no friction rub.   No murmur heard. Pulmonary/Chest: Effort normal and breath sounds normal. No respiratory distress. She has no wheezes. She has no rales.  Musculoskeletal:       Right ankle: Normal.       Left ankle: She exhibits swelling. She exhibits normal range of motion, no ecchymosis and normal pulse. Tenderness. Lateral malleolus, AITFL and CF ligament tenderness found. No medial malleolus, no posterior TFL, no head of 5th metatarsal and no proximal fibula tenderness found. Achilles tendon exhibits no pain, no defect and normal Thompson's test results.       Feet:  Skin: She is not diaphoretic.  Vitals reviewed.      Assessment & Plan:     1. Left ankle pain Was seen by ortho and was told to f/u with podiatry for further evaluation. Advised to continue aleve 2 pills BID, ice and heat, compression (bracing) and elevation. Will refer to podiatry as below for further eval and treatment. - Ambulatory referral to Podiatry  2. Left ankle swelling See above  medical treatment plan. - Ambulatory referral to Podiatry  3. Left ankle gives way See above medical treatment plan. - Ambulatory referral to [place], PA-C  [facility]

## 2016-07-30 NOTE — Patient Instructions (Signed)

## 2016-08-10 DIAGNOSIS — M25572 Pain in left ankle and joints of left foot: Secondary | ICD-10-CM | POA: Diagnosis not present

## 2016-08-10 DIAGNOSIS — M65872 Other synovitis and tenosynovitis, left ankle and foot: Secondary | ICD-10-CM | POA: Diagnosis not present

## 2016-08-10 DIAGNOSIS — M79672 Pain in left foot: Secondary | ICD-10-CM | POA: Diagnosis not present

## 2016-08-10 DIAGNOSIS — M7672 Peroneal tendinitis, left leg: Secondary | ICD-10-CM | POA: Diagnosis not present

## 2016-08-27 ENCOUNTER — Ambulatory Visit: Payer: Self-pay

## 2016-09-03 ENCOUNTER — Ambulatory Visit
Admission: RE | Admit: 2016-09-03 | Discharge: 2016-09-03 | Disposition: A | Discharge status: Home / Self Care | Payer: BLUE CROSS/BLUE SHIELD | Source: Ambulatory Visit | Attending: Physician Assistant | Admitting: Physician Assistant

## 2016-09-03 ENCOUNTER — Ambulatory Visit: Admission: RE | Admit: 2016-09-03 | Payer: Self-pay | Source: Ambulatory Visit

## 2016-09-03 ENCOUNTER — Other Ambulatory Visit: Payer: Self-pay | Admitting: Physician Assistant

## 2016-09-03 DIAGNOSIS — Z1231 Encounter for screening mammogram for malignant neoplasm of breast: Secondary | ICD-10-CM | POA: Insufficient documentation

## 2016-09-03 DIAGNOSIS — Z1239 Encounter for other screening for malignant neoplasm of breast: Secondary | ICD-10-CM

## 2016-09-03 HISTORY — DX: Malignant (primary) neoplasm, unspecified: C80.1

## 2016-09-14 DIAGNOSIS — M65872 Other synovitis and tenosynovitis, left ankle and foot: Secondary | ICD-10-CM | POA: Diagnosis not present

## 2016-09-14 DIAGNOSIS — M25572 Pain in left ankle and joints of left foot: Secondary | ICD-10-CM | POA: Diagnosis not present

## 2016-10-01 DIAGNOSIS — M5023 Other cervical disc displacement, cervicothoracic region: Secondary | ICD-10-CM | POA: Diagnosis not present

## 2016-10-01 DIAGNOSIS — G959 Disease of spinal cord, unspecified: Secondary | ICD-10-CM | POA: Diagnosis not present

## 2016-10-12 ENCOUNTER — Other Ambulatory Visit: Payer: Self-pay | Admitting: Neurosurgery

## 2016-10-12 DIAGNOSIS — G9589 Other specified diseases of spinal cord: Secondary | ICD-10-CM

## 2016-10-26 ENCOUNTER — Encounter: Payer: BLUE CROSS/BLUE SHIELD | Admitting: Physician Assistant

## 2016-10-27 ENCOUNTER — Ambulatory Visit: Payer: BLUE CROSS/BLUE SHIELD

## 2016-10-28 ENCOUNTER — Encounter: Payer: Self-pay | Admitting: Physician Assistant

## 2016-10-28 ENCOUNTER — Ambulatory Visit (INDEPENDENT_AMBULATORY_CARE_PROVIDER_SITE_OTHER): Payer: BLUE CROSS/BLUE SHIELD | Admitting: Physician Assistant

## 2016-10-28 VITALS — BP 110/70 | HR 72 | Temp 98.2°F | Resp 16 | Ht 64.0 in | Wt 136.4 lb

## 2016-10-28 DIAGNOSIS — Z0000 Encounter for general adult medical examination without abnormal findings: Secondary | ICD-10-CM

## 2016-10-28 DIAGNOSIS — Z1159 Encounter for screening for other viral diseases: Secondary | ICD-10-CM

## 2016-10-28 DIAGNOSIS — Z833 Family history of diabetes mellitus: Secondary | ICD-10-CM

## 2016-10-28 DIAGNOSIS — Z124 Encounter for screening for malignant neoplasm of cervix: Secondary | ICD-10-CM

## 2016-10-28 DIAGNOSIS — Z13220 Encounter for screening for lipoid disorders: Secondary | ICD-10-CM

## 2016-10-28 DIAGNOSIS — Z136 Encounter for screening for cardiovascular disorders: Secondary | ICD-10-CM | POA: Diagnosis not present

## 2016-10-28 NOTE — Progress Notes (Signed)
[name], Female    DOB: 1961/08/28, 55 y.o.   MRN: [id number] Visit Date: 10/28/2016  Today's Provider: [name], PA-C   Chief Complaint  Patient presents with  . Annual Exam   Subjective:    Annual physical exam Natalie Dickerson is a 55 y.o. female who presents today for health maintenance and complete physical. She feels well. She reports exercising. She reports she is sleeping fairly well. She reports that she having an MRI done tomorrow for a spot that had been seen on her spine during a CT scan. She is also doing PT for her neck pain and doing very well. She reports that she is about 70-80% better.  Patient Declined Influenza vaccine CPE:10/25/2015 Pap:10/25/15-Pos, HPV-Negative Mammogram:09/03/2016 BI-RADS 1 Colonoscopy:08/03/14-Polyps-Recheck in 5 years. -----------------------------------------------------------------   Review of Systems  Constitutional: Negative.   HENT: Positive for tinnitus.   Eyes: Negative.   Respiratory: Negative.   Cardiovascular: Negative.   Gastrointestinal: Negative.   Endocrine: Negative.   Genitourinary: Negative.   Musculoskeletal: Positive for neck pain (she started therapy 2 weeks ago) and neck stiffness.  Skin: Negative.   Allergic/Immunologic: Negative.   Neurological: Negative.   Hematological: Negative.   Psychiatric/Behavioral: Negative.     Social History      She  reports that she has quit smoking. She has never used smokeless tobacco. She reports that she drinks alcohol. She reports that she does not use drugs.       Social History   Social History  . Marital status: Married    Spouse name: N/A  . Number of children: N/A  . Years of education: N/A   Social History Main Topics  . Smoking status: Former Research scientist (life sciences)  . Smokeless tobacco: Never Used     Comment: Smoked for about 5 years, smoked about 1 pack per day. Quit in 1984  . Alcohol use Yes     Comment: Occasional alcohol use; Drinks  2-4 glasses of wine per week.  . Drug use: No  . Sexual activity: Not Asked   Other Topics Concern  . None   Social History Narrative  . None    Past Medical History:  Diagnosis Date  . Cancer [facility])    skin ca     Patient Active Problem List   Diagnosis Date Noted  . Avitaminosis D 10/25/2015  . B12 deficiency 10/25/2015  . History of chicken pox 10/22/2015  . Uterine fibroid 10/22/2015  . Basal cell carcinoma of face 09/27/2013  . Cervical pain 07/19/2008  . Irregular bleeding 07/19/2008  . Candidal vulvovaginitis 03/01/2008    Past Surgical History:  Procedure Laterality Date  . FINGER SURGERY      Family History        Family Status  Relation Status  . Mother Alive  . Father Deceased at age 37   died from breast cancer   . Maternal Grandmother Deceased  . Maternal Grandfather Deceased  . Paternal Grandmother Deceased  . Paternal Grandfather Deceased        Her family history includes Breast cancer in her father and mother; Cancer in her mother; Hypertension in her mother.    No Known Allergies  Current Meds  Medication Sig  . Cholecalciferol (VITAMIN D-3) 1000 UNITS CAPS Take 1 capsule by mouth daily.  . Cyanocobalamin (VITAMIN B12 PO) Take by mouth daily.  Randell Loop Prim-Borage (FLAX OIL XTRA) CAPS Take by mouth.  . Multiple Vitamin tablet Take  by mouth.    Patient Care Team: [name], PA-C as PCP - General (Family Medicine)     Objective:   Vitals: BP 110/70 (BP Location: Right Arm, Patient Position: Sitting, Cuff Size: Normal)   Pulse 72   Temp 98.2 F (36.8 C) (Oral)   Resp 16   Ht 5\' 4"  (1.626 m)   Wt 136 lb 6.4 oz (61.9 kg)   BMI 23.41 kg/m    Physical Exam  Constitutional: She is oriented to person, place, and time. She appears well-developed and well-nourished. No distress.  HENT:  Head: Normocephalic and atraumatic.  Right Ear: Hearing, tympanic membrane, external ear and ear canal normal.  Left Ear: Hearing,  tympanic membrane, external ear and ear canal normal.  Nose: Nose normal.  Mouth/Throat: Uvula is midline, oropharynx is clear and moist and mucous membranes are normal. No oropharyngeal exudate.  Eyes: Conjunctivae and EOM are normal. Pupils are equal, round, and reactive to light. Right eye exhibits no discharge. Left eye exhibits no discharge. No scleral icterus.  Neck: Normal range of motion. Neck supple. No JVD present. Carotid bruit is not present. No tracheal deviation present. No thyromegaly present.  Cardiovascular: Normal rate, regular rhythm, normal heart sounds and intact distal pulses.  Exam reveals no gallop and no friction rub.   No murmur heard. Pulmonary/Chest: Effort normal and breath sounds normal. No respiratory distress. She has no wheezes. She has no rales. She exhibits no tenderness. Right breast exhibits no inverted nipple, no mass, no nipple discharge, no skin change and no tenderness. Left breast exhibits no inverted nipple, no mass, no nipple discharge, no skin change and no tenderness. Breasts are symmetrical.  Abdominal: Soft. Bowel sounds are normal. She exhibits no distension and no mass. There is no tenderness. There is no rebound and no guarding. Hernia confirmed negative in the right inguinal area and confirmed negative in the left inguinal area.  Genitourinary: Rectum normal, vagina normal and uterus normal. No breast swelling, tenderness, discharge or bleeding. Pelvic exam was performed with patient supine. There is no rash, tenderness, lesion or injury on the right labia. There is no rash, tenderness, lesion or injury on the left labia. Cervix exhibits no motion tenderness, no discharge and no friability. Right adnexum displays no mass, no tenderness and no fullness. Left adnexum displays no mass, no tenderness and no fullness. No erythema, tenderness or bleeding in the vagina. No signs of injury around the vagina. No vaginal discharge found.  Musculoskeletal: Normal  range of motion. She exhibits no edema or tenderness.  Lymphadenopathy:    She has no cervical adenopathy.       Right: No inguinal adenopathy present.       Left: No inguinal adenopathy present.  Neurological: She is alert and oriented to person, place, and time. She has normal reflexes. No cranial nerve deficit. Coordination normal.  Skin: Skin is warm and dry. No rash noted. She is not diaphoretic.  Psychiatric: She has a normal mood and affect. Her behavior is normal. Judgment and thought content normal.  Vitals reviewed.    Depression Screen No flowsheet data found.    Assessment & Plan:     Routine Health Maintenance and Physical Exam  Exercise Activities and Dietary recommendations Goals    None      Immunization History  Administered Date(s) Administered  . Td 07/13/2016  . Tdap 08/16/2008    Health Maintenance  Topic Date Due  . Hepatitis C Screening  August 21, 1961  .  HIV Screening  12/14/1977  . INFLUENZA VACCINE  07/28/2016  . MAMMOGRAM  09/03/2018  . PAP SMEAR  10/24/2018  . COLONOSCOPY  08/04/2019  . TETANUS/TDAP  07/13/2026      Discussed health benefits of physical activity, and encouraged her to engage in regular exercise appropriate for her age and condition.    1. Annual physical exam Normal physical exam today. Will check labs as below and f/u pending lab results. If labs are stable and WNL she will not need to have these rechecked for one year at her next annual physical exam. She is to call the office in the meantime if she has any acute issue, questions or concerns. - CBC with Differential/Platelet - Comprehensive metabolic panel - TSH  2. Cervical cancer screening Previously ASCUS with negative HPV in 2016. Pap collected today. Will send as below and f/u pending results. - Pap IG w/ reflex to HPV when ASC-U  3. Family history of diabetes mellitus (DM) Will check labs as below and f/u pending results. - Hemoglobin A1c  4. Encounter for  lipid screening for cardiovascular disease Will check labs as below and f/u pending results. - Lipid panel  5. Need for hepatitis C screening test - Hepatitis C antibody  --------------------------------------------------------------------    [name], PA-C  [facility]

## 2016-10-28 NOTE — Patient Instructions (Signed)

## 2016-10-29 DIAGNOSIS — M47812 Spondylosis without myelopathy or radiculopathy, cervical region: Secondary | ICD-10-CM | POA: Diagnosis not present

## 2016-10-29 DIAGNOSIS — Z13220 Encounter for screening for lipoid disorders: Secondary | ICD-10-CM | POA: Diagnosis not present

## 2016-10-29 DIAGNOSIS — Z136 Encounter for screening for cardiovascular disorders: Secondary | ICD-10-CM | POA: Diagnosis not present

## 2016-10-29 DIAGNOSIS — M50321 Other cervical disc degeneration at C4-C5 level: Secondary | ICD-10-CM | POA: Diagnosis not present

## 2016-10-29 DIAGNOSIS — Z0000 Encounter for general adult medical examination without abnormal findings: Secondary | ICD-10-CM | POA: Diagnosis not present

## 2016-10-29 DIAGNOSIS — Z833 Family history of diabetes mellitus: Secondary | ICD-10-CM | POA: Diagnosis not present

## 2016-10-29 DIAGNOSIS — G9529 Other cord compression: Secondary | ICD-10-CM | POA: Diagnosis not present

## 2016-10-30 ENCOUNTER — Telehealth: Payer: Self-pay

## 2016-10-30 LAB — CBC WITH DIFFERENTIAL/PLATELET
Basophils Absolute: 0 10*3/uL (ref 0.0–0.2)
Basos: 1 %
EOS (ABSOLUTE): 0.1 10*3/uL (ref 0.0–0.4)
Eos: 1 %
Hematocrit: 42.2 % (ref 34.0–46.6)
Hemoglobin: 14.3 g/dL (ref 11.1–15.9)
Immature Grans (Abs): 0 10*3/uL (ref 0.0–0.1)
Immature Granulocytes: 0 %
Lymphocytes Absolute: 1.2 10*3/uL (ref 0.7–3.1)
Lymphs: 23 %
MCH: 30.2 pg (ref 26.6–33.0)
MCHC: 33.9 g/dL (ref 31.5–35.7)
MCV: 89 fL (ref 79–97)
Monocytes Absolute: 0.5 10*3/uL (ref 0.1–0.9)
Monocytes: 9 %
Neutrophils Absolute: 3.4 10*3/uL (ref 1.4–7.0)
Neutrophils: 66 %
Platelets: 307 10*3/uL (ref 150–379)
RBC: 4.74 x10E6/uL (ref 3.77–5.28)
RDW: 13 % (ref 12.3–15.4)
WBC: 5.1 10*3/uL (ref 3.4–10.8)

## 2016-10-30 LAB — LIPID PANEL
Chol/HDL Ratio: 2.2 ratio units (ref 0.0–4.4)
Cholesterol, Total: 186 mg/dL (ref 100–199)
HDL: 84 mg/dL (ref >39)
LDL Calculated: 87 mg/dL (ref 0–99)
Triglycerides: 75 mg/dL (ref 0–149)
VLDL Cholesterol Cal: 15 mg/dL (ref 5–40)

## 2016-10-30 LAB — COMPREHENSIVE METABOLIC PANEL
ALT: 14 IU/L (ref 0–32)
AST: 18 IU/L (ref 0–40)
Albumin/Globulin Ratio: 1.5 (ref 1.2–2.2)
Albumin: 4.2 g/dL (ref 3.5–5.5)
Alkaline Phosphatase: 78 IU/L (ref 39–117)
BUN/Creatinine Ratio: 17 (ref 9–23)
BUN: 13 mg/dL (ref 6–24)
Bilirubin Total: 0.4 mg/dL (ref 0.0–1.2)
CO2: 23 mmol/L (ref 18–29)
Calcium: 9.5 mg/dL (ref 8.7–10.2)
Chloride: 104 mmol/L (ref 96–106)
Creatinine, Ser: 0.77 mg/dL (ref 0.57–1.00)
GFR calc Af Amer: 102 mL/min/{1.73_m2} (ref >59)
GFR calc non Af Amer: 88 mL/min/{1.73_m2} (ref >59)
Globulin, Total: 2.8 g/dL (ref 1.5–4.5)
Glucose: 93 mg/dL (ref 65–99)
Potassium: 4.7 mmol/L (ref 3.5–5.2)
Sodium: 143 mmol/L (ref 134–144)
Total Protein: 7 g/dL (ref 6.0–8.5)

## 2016-10-30 LAB — HEMOGLOBIN A1C
Est. average glucose Bld gHb Est-mCnc: 105 mg/dL
Hgb A1c MFr Bld: 5.3 % (ref 4.8–5.6)

## 2016-10-30 LAB — TSH: TSH: 1.17 u[IU]/mL (ref 0.450–4.500)

## 2016-10-30 LAB — HEPATITIS C ANTIBODY: Hep C Virus Ab: <0.1 s/co ratio (ref 0.0–0.9)

## 2016-10-30 NOTE — Telephone Encounter (Signed)
Patient advised as below.  

## 2016-10-30 NOTE — Telephone Encounter (Signed)
-----   Message from [name], [place] sent at 10/30/2016  8:42 AM EDT ----- All labs are within normal limits and stable.  Thanks! -JB

## 2016-11-02 ENCOUNTER — Telehealth: Payer: Self-pay

## 2016-11-02 LAB — PAP IG W/ RFLX HPV ASCU: PAP Smear Comment: 0

## 2016-11-02 NOTE — Telephone Encounter (Signed)
LMTCB

## 2016-11-02 NOTE — Telephone Encounter (Signed)
Patient was advised. KW 

## 2016-11-02 NOTE — Telephone Encounter (Signed)
-----   Message from [name], PA-C sent at 11/02/2016  1:40 PM EST ----- Pap was normal this year

## 2016-11-10 DIAGNOSIS — M542 Cervicalgia: Secondary | ICD-10-CM | POA: Diagnosis not present

## 2016-11-10 DIAGNOSIS — M5023 Other cervical disc displacement, cervicothoracic region: Secondary | ICD-10-CM | POA: Diagnosis not present

## 2016-11-30 DIAGNOSIS — L578 Other skin changes due to chronic exposure to nonionizing radiation: Secondary | ICD-10-CM | POA: Diagnosis not present

## 2016-11-30 DIAGNOSIS — D229 Melanocytic nevi, unspecified: Secondary | ICD-10-CM | POA: Diagnosis not present

## 2016-11-30 DIAGNOSIS — Z85828 Personal history of other malignant neoplasm of skin: Secondary | ICD-10-CM | POA: Diagnosis not present

## 2016-11-30 DIAGNOSIS — Z1283 Encounter for screening for malignant neoplasm of skin: Secondary | ICD-10-CM | POA: Diagnosis not present

## 2016-11-30 DIAGNOSIS — L570 Actinic keratosis: Secondary | ICD-10-CM | POA: Diagnosis not present

## 2016-12-09 ENCOUNTER — Ambulatory Visit (INDEPENDENT_AMBULATORY_CARE_PROVIDER_SITE_OTHER): Payer: BLUE CROSS/BLUE SHIELD | Admitting: Physician Assistant

## 2016-12-09 ENCOUNTER — Encounter: Payer: Self-pay | Admitting: Physician Assistant

## 2016-12-09 VITALS — BP 140/80 | HR 82 | Temp 98.3°F | Resp 16 | Wt 139.2 lb

## 2016-12-09 DIAGNOSIS — M62830 Muscle spasm of back: Secondary | ICD-10-CM | POA: Diagnosis not present

## 2016-12-09 MED ORDER — IBUPROFEN 600 MG PO TABS: 600.0000 mg | ORAL_TABLET | Freq: Three times a day (TID) | ORAL | 0 refills | Status: DC | PRN

## 2016-12-09 MED ORDER — BACLOFEN 10 MG PO TABS: 5.0000 mg | ORAL_TABLET | Freq: Three times a day (TID) | ORAL | 1 refills | Status: DC

## 2016-12-09 NOTE — Patient Instructions (Signed)
Muscle Cramps and Spasms Muscle cramps and spasms occur when a muscle or muscles tighten and you have no control over this tightening (involuntary muscle contraction). They are a common problem and can develop in any muscle. The most common place is in the calf muscles of the leg. Both muscle cramps and muscle spasms are involuntary muscle contractions, but they also have differences:   Muscle cramps are sporadic and painful. They may last a few seconds to a quarter of an hour. Muscle cramps are often more forceful and last longer than muscle spasms.  Muscle spasms may or may not be painful. They may also last just a few seconds or much longer. CAUSES  It is uncommon for cramps or spasms to be due to a serious underlying problem. In many cases, the cause of cramps or spasms is unknown. Some common causes are:   Overexertion.   Overuse from repetitive motions (doing the same thing over and over).   Remaining in a certain position for a long period of time.   Improper preparation, form, or technique while performing a sport or activity.   Dehydration.   Injury.   Side effects of some medicines.   Abnormally low levels of the salts and ions in your blood (electrolytes), especially potassium and calcium. This could happen if you are taking water pills (diuretics) or you are pregnant.  Some underlying medical problems can make it more likely to develop cramps or spasms. These include, but are not limited to:   Diabetes.   Parkinson disease.   Hormone disorders, such as thyroid problems.   Alcohol abuse.   Diseases specific to muscles, joints, and bones.   Blood vessel disease where not enough blood is getting to the muscles.  HOME CARE INSTRUCTIONS   Stay well hydrated. Drink enough water and fluids to keep your urine clear or pale yellow.  It may be helpful to massage, stretch, and relax the affected muscle.  For tight or tense muscles, use a warm towel, heating  pad, or hot shower water directed to the affected area.  If you are sore or have pain after a cramp or spasm, applying ice to the affected area may relieve discomfort.  Put ice in a plastic bag.  Place a towel between your skin and the bag.  Leave the ice on for 15-20 minutes, 3-4 times a day.  Medicines used to treat a known cause of cramps or spasms may help reduce their frequency or severity. Only take over-the-counter or prescription medicines as directed by your caregiver. SEEK MEDICAL CARE IF:  Your cramps or spasms get more severe, more frequent, or do not improve over time.  MAKE SURE YOU:   Understand these instructions.  Will watch your condition.  Will get help right away if you are not doing well or get worse. This information is not intended to replace advice given to you by your health care provider. Make sure you discuss any questions you have with your health care provider. Document Released: 06/05/2002 Document Revised: 04/10/2013 Document Reviewed: 09/17/2015 Elsevier Interactive Patient Education  2017 Elsevier Inc. Baclofen tablets What is this medicine? BACLOFEN (BAK loe fen) helps relieve spasms and cramping of muscles. It may be used to treat symptoms of multiple sclerosis or spinal cord injury. This medicine may be used for other purposes; ask your health care provider or pharmacist if you have questions. COMMON BRAND NAME(S): ED Baclofen, Lioresal What should I tell my health care provider before I take this  medicine? They need to know if you have any of these conditions: -kidney disease -seizures -stroke -an unusual or allergic reaction to baclofen, other medicines, foods, dyes, or preservatives -pregnant or trying to get pregnant -breast-feeding How should I use this medicine? Take this medicine by mouth. Swallow it with a drink of water. Follow the directions on the prescription label. Do not take more medicine than you are told to take. Talk to your  pediatrician regarding the use of this medicine in children. Special care may be needed. Overdosage: If you think you have taken too much of this medicine contact a poison control center or emergency room at once. NOTE: This medicine is only for you. Do not share this medicine with others. What if I miss a dose? If you miss a dose, take it as soon as you can. If it is almost time for your next dose, take only that dose. Do not take double or extra doses. What may interact with this medicine? Do not take this medication with any of the following medicines: -narcotic medicines for cough This medicine may also interact with the following medications: -alcohol -antihistamines for allergy, cough and cold -certain medicines for anxiety or sleep -certain medicines for depression like amitriptyline, fluoxetine, sertraline -certain medicines for seizures like phenobarbital, primidone -general anesthetics like halothane, isoflurane, methoxyflurane, propofol -local anesthetics like lidocaine, pramoxine, tetracaine -medicines that relax muscles for surgery -narcotic medicines for pain -phenothiazines like chlorpromazine, mesoridazine, prochlorperazine, thioridazine This list may not describe all possible interactions. Give your health care provider a list of all the medicines, herbs, non-prescription drugs, or dietary supplements you use. Also tell them if you smoke, drink alcohol, or use illegal drugs. Some items may interact with your medicine. What should I watch for while using this medicine? Tell your doctor or health care professional if your symptoms do not start to get better or if they get worse. Do not suddenly stop taking your medicine. If you do, you may develop a severe reaction. If your doctor wants you to stop the medicine, the dose will be slowly lowered over time to avoid any side effects. Follow the advice of your doctor. You may get drowsy or dizzy. Do not drive, use machinery, or do  anything that needs mental alertness until you know how this medicine affects you. Do not stand or sit up quickly, especially if you are an older patient. This reduces the risk of dizzy or fainting spells. Alcohol may interfere with the effect of this medicine. Avoid alcoholic drinks. If you are taking another medicine that also causes drowsiness, you may have more side effects. Give your health care provider a list of all medicines you use. Your doctor will tell you how much medicine to take. Do not take more medicine than directed. Call emergency for help if you have problems breathing or unusual sleepiness. What side effects may I notice from receiving this medicine? Side effects that you should report to your doctor or health care professional as soon as possible: -allergic reactions like skin rash, itching or hives, swelling of the face, lips, or tongue -breathing problems -changes in emotions or moods -changes in vision -chest pain -fast, irregular heartbeat -feeling faint or lightheaded, falls -hallucinations -loss of balance or coordination -ringing of the ears -seizures -trouble passing urine or change in the amount of urine -trouble walking -unusually weak or tired Side effects that usually do not require medical attention (report to your doctor or health care professional if they continue or  are bothersome): -changes in taste -confusion -constipation -diarrhea -dry mouth -headache -muscle weakness -nausea, vomiting -trouble sleeping This list may not describe all possible side effects. Call your doctor for medical advice about side effects. You may report side effects to FDA at 1-800-FDA-1088. Where should I keep my medicine? Keep out of the reach of children. Store at room temperature between 15 and 30 degrees C (59 and 86 degrees F). Keep container tightly closed. Throw away any unused medicine after the expiration date. NOTE: This sheet is a summary. It may not cover all  possible information. If you have questions about this medicine, talk to your doctor, pharmacist, or health care provider.  2017 Elsevier/Gold Standard (2015-09-23 15:56:23)

## 2016-12-09 NOTE — Progress Notes (Signed)
[name] Female    DOB: [birth year]   55 y.o.   MRN: [id number] Visit Date: 12/09/2016  Today's Provider: [name], PA-C   Chief Complaint  Patient presents with  . Back Pain   Subjective:    HPI Back Pain: Patient presents for presents evaluation of  back problems.  Symptoms have been present for 1 week and include pain in midline left side of her back (aching and burning in character; 4/10 in severity). Initial inciting event: none. Symptoms are worst: mid-day, afternoon, evening, nighttime. Exacerbating factors identifiable by patient are bending backwards, bending forwards, bending sideways, sitting, standing, walking and lifting things. Treatments so far initiated by patient: heat,ice and advil Previous back problems: none. Previous workup: none. Previous treatments: none.     No Known Allergies   Current Outpatient Prescriptions:  .  Cholecalciferol (VITAMIN D-3) 1000 UNITS CAPS, Take 1 capsule by mouth daily., Disp: , Rfl:  .  Cyanocobalamin (VITAMIN B12 PO), Take by mouth daily., Disp: , Rfl:  .  Flaxseed-Eve Prim-Borage (FLAX OIL XTRA) CAPS, Take by mouth., Disp: , Rfl:  .  Multiple Vitamin tablet, Take by mouth., Disp: , Rfl:  .  gabapentin (NEURONTIN) 100 MG capsule, Take 1 capsule (100 mg total) by mouth 3 (three) times daily. (Patient not taking: Reported on 12/09/2016), Disp: 30 capsule, Rfl: 0 .  predniSONE (STERAPRED UNI-PAK 21 TAB) 10 MG (21) TBPK tablet, Dispense steroid taper pack as directed (Patient not taking: Reported on 12/09/2016), Disp: 21 tablet, Rfl: 0  Review of Systems  Constitutional: Negative.   Respiratory: Negative.   Cardiovascular: Negative for chest pain, palpitations and leg swelling.  Gastrointestinal: Negative.   Genitourinary: Negative.   Musculoskeletal: Positive for back pain.  Neurological: Negative for weakness and numbness.    Social History  Substance Use Topics  . Smoking status: Former Research scientist (life sciences)    . Smokeless tobacco: Never Used     Comment: Smoked for about 5 years, smoked about 1 pack per day. Quit in 1984  . Alcohol use Yes     Comment: Occasional alcohol use; Drinks 2-4 glasses of wine per week.   Objective:   BP 140/80 (BP Location: Right Arm, Patient Position: Sitting, Cuff Size: Normal)   Pulse 82   Temp 98.3 F (36.8 C) (Oral)   Resp 16   Wt 139 lb 3.2 oz (63.1 kg)   BMI 23.89 kg/m   Physical Exam  Constitutional: She appears well-developed and well-nourished. No distress.  Neck: Normal range of motion. Neck supple.  Cardiovascular: Normal rate, regular rhythm and normal heart sounds.  Exam reveals no gallop and no friction rub.   No murmur heard. Pulmonary/Chest: Effort normal and breath sounds normal. No respiratory distress. She has no wheezes. She has no rales.  Musculoskeletal:       Cervical back: Normal.       Thoracic back: She exhibits tenderness (tender to palpation over left paraspinal and latissimus dorsi) and spasm (spasm noted in same region there is tenderness ). She exhibits normal range of motion (rotation and deep breathing patient feels pain) and no bony tenderness.       Lumbar back: Normal.  Skin: She is not diaphoretic.  Vitals reviewed.      Assessment & Plan:     1. Muscle spasm of back Muscle spasm in left latissimus dorsi and thoracic paraspinal region. I will give baclofen and ibuprofen as below. She is to try this  treatment for 7-10 days. She is to call the office if there is no marked improvement and we will obtain x-ray of the thoracic spine at that time. Patient does have history of spinal stenosis with radiculopathy in her cervical spine that is being treated by Dr. Beatris Ship. - baclofen (LIORESAL) 10 MG tablet; Take 0.5-1 tablets (5-10 mg total) by mouth 3 (three) times daily.  Dispense: 30 each; Refill: 1 - ibuprofen (ADVIL,MOTRIN) 600 MG tablet; Take 1 tablet (600 mg total) by mouth every 8 (eight) hours as needed.  Dispense: 60  tablet; Refill: 0       [name], PA-C  [facility]

## 2016-12-17 DIAGNOSIS — M542 Cervicalgia: Secondary | ICD-10-CM | POA: Diagnosis not present

## 2016-12-22 ENCOUNTER — Telehealth: Payer: Self-pay | Admitting: Physician Assistant

## 2016-12-22 DIAGNOSIS — M546 Pain in thoracic spine: Secondary | ICD-10-CM

## 2016-12-22 NOTE — Telephone Encounter (Signed)
Xray ordered. She can go to  outpatient imaging at her convenience.

## 2016-12-22 NOTE — Telephone Encounter (Signed)
lmtcb Carlton Sweaney Drozdowski, CMA  

## 2016-12-22 NOTE — Telephone Encounter (Signed)
Pt was in last week with a back spams.  She said it is still hurting and you told her if it continued to call back and you would order imaging.  Pt's call back is [phone]  [facility]

## 2016-12-22 NOTE — Telephone Encounter (Signed)
Pt advised. Luara Faye Drozdowski, CMA  

## 2016-12-23 ENCOUNTER — Ambulatory Visit
Admission: RE | Admit: 2016-12-23 | Discharge: 2016-12-23 | Disposition: A | Discharge status: Home / Self Care | Payer: BLUE CROSS/BLUE SHIELD | Source: Ambulatory Visit | Attending: Physician Assistant | Admitting: Physician Assistant

## 2016-12-23 DIAGNOSIS — M4804 Spinal stenosis, thoracic region: Secondary | ICD-10-CM | POA: Diagnosis not present

## 2016-12-23 DIAGNOSIS — M546 Pain in thoracic spine: Secondary | ICD-10-CM | POA: Diagnosis not present

## 2016-12-23 DIAGNOSIS — J8410 Pulmonary fibrosis, unspecified: Secondary | ICD-10-CM | POA: Diagnosis not present

## 2016-12-23 DIAGNOSIS — M50323 Other cervical disc degeneration at C6-C7 level: Secondary | ICD-10-CM | POA: Diagnosis not present

## 2016-12-24 ENCOUNTER — Telehealth: Payer: Self-pay | Admitting: Physician Assistant

## 2016-12-24 DIAGNOSIS — J8410 Pulmonary fibrosis, unspecified: Secondary | ICD-10-CM

## 2016-12-24 NOTE — Telephone Encounter (Signed)
Patient advised as below. Patient verbalizes understanding and is in agreement with treatment plan. Patient is requesting to have done tomorrow after 12 pm and at [facility]. Please review. sd

## 2016-12-24 NOTE — Telephone Encounter (Signed)
Pt is returning call.  CB#[phone]/MW

## 2016-12-24 NOTE — Addendum Note (Signed)
Addended by: [name] on: 12/24/2016 03:36 PM   Modules accepted: Orders

## 2016-12-24 NOTE — Telephone Encounter (Signed)
-----   Message from [name], [place] sent at 12/24/2016 12:04 PM EST ----- Thoracic spine is fairly normal with mild arthritis noted. The lesion at C4 is still visualized and I know Dr. Saintclair Halsted has ordered the MRI for that. There are some calcified areas noted within the lungs. I could not find record of this being noted previously. Have you been told about this previously? If not, I would recommend a chest CT for further evaluation.

## 2016-12-24 NOTE — Telephone Encounter (Signed)
CT chest ordered.

## 2016-12-24 NOTE — Telephone Encounter (Signed)
Smoking may have caused and could have been old infection. Sometimes still beneficial for CT to verify that this is what they are. I do not feel they would cause back pain either.   I think back pain is muscular in nature. Can either discuss with Dr. Saintclair Halsted or if he cannot address, can refer to another orthopedic. PT may benefit the most.

## 2016-12-24 NOTE — Telephone Encounter (Signed)
Patient agreed to CT scan. sd

## 2016-12-24 NOTE — Telephone Encounter (Signed)
Patient called back to report that she did smoke 1 pack a day for about 10 years. Patient thinks the calcified area may be due to smoking. Patient not sure that it is causing her pain and is not sure if CT scan is necessary.

## 2016-12-25 ENCOUNTER — Telehealth: Payer: Self-pay | Admitting: Physician Assistant

## 2016-12-25 DIAGNOSIS — I722 Aneurysm of renal artery: Secondary | ICD-10-CM | POA: Diagnosis not present

## 2016-12-25 DIAGNOSIS — J984 Other disorders of lung: Secondary | ICD-10-CM

## 2016-12-25 DIAGNOSIS — J8410 Pulmonary fibrosis, unspecified: Secondary | ICD-10-CM

## 2016-12-25 DIAGNOSIS — M62830 Muscle spasm of back: Secondary | ICD-10-CM

## 2016-12-25 DIAGNOSIS — R918 Other nonspecific abnormal finding of lung field: Secondary | ICD-10-CM | POA: Diagnosis not present

## 2016-12-25 NOTE — Telephone Encounter (Signed)
Patient reports MRI was done but nothing showed on it.

## 2016-12-25 NOTE — Telephone Encounter (Signed)
Well that area was still noted to be present on the xray at C4. Thought to possibly be overlying bones on that image. I do not think this would cause the pain you are having that is lower. Could cause neck pain.

## 2016-12-25 NOTE — Telephone Encounter (Signed)
na

## 2016-12-25 NOTE — Telephone Encounter (Signed)
May see it but unsure. I thought Dr. Saintclair Halsted had ordered MRI for that?

## 2016-12-25 NOTE — Telephone Encounter (Signed)
Pt states that a mass was found on her spine some time back.She is wondering if this what may be causing her back pain and if the CT you ordered would be able to check this

## 2016-12-25 NOTE — Telephone Encounter (Signed)
Please review. Thank you. sd  

## 2016-12-29 DIAGNOSIS — I722 Aneurysm of renal artery: Secondary | ICD-10-CM | POA: Insufficient documentation

## 2016-12-29 DIAGNOSIS — J8410 Pulmonary fibrosis, unspecified: Secondary | ICD-10-CM | POA: Insufficient documentation

## 2016-12-29 DIAGNOSIS — J984 Other disorders of lung: Secondary | ICD-10-CM | POA: Insufficient documentation

## 2016-12-29 MED ORDER — IBUPROFEN 600 MG PO TABS: 600.0000 mg | ORAL_TABLET | Freq: Three times a day (TID) | ORAL | 1 refills | Status: DC | PRN

## 2016-12-29 MED ORDER — BACLOFEN 10 MG PO TABS: 5.0000 mg | ORAL_TABLET | Freq: Three times a day (TID) | ORAL | 1 refills | Status: DC

## 2016-12-29 NOTE — Telephone Encounter (Signed)
Discussed CT results with patient. She does have calcified pulmonary nodules and hilar lymph nodes c/w benign post granulomatous changes.  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 I will refer patient to vascular surgery for further evaluation to see if this needs intervention or just monitoring.

## 2016-12-30 ENCOUNTER — Encounter: Payer: Self-pay | Admitting: Physician Assistant

## 2017-01-05 ENCOUNTER — Encounter (INDEPENDENT_AMBULATORY_CARE_PROVIDER_SITE_OTHER): Payer: Self-pay | Admitting: Vascular Surgery

## 2017-01-05 ENCOUNTER — Ambulatory Visit (INDEPENDENT_AMBULATORY_CARE_PROVIDER_SITE_OTHER): Payer: BLUE CROSS/BLUE SHIELD | Admitting: Vascular Surgery

## 2017-01-05 VITALS — BP 124/79 | HR 69 | Resp 17 | Ht 63.0 in | Wt 136.0 lb

## 2017-01-05 DIAGNOSIS — I722 Aneurysm of renal artery: Secondary | ICD-10-CM

## 2017-01-05 NOTE — Progress Notes (Signed)
Patient ID: Natalie Dickerson, female   DOB: 1961-01-25, 56 y.o.   MRN: [id number]  Chief Complaint  Patient presents with  . New Patient (Initial Visit)    HPI Natalie Dickerson is a 56 y.o. female.  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  The patient reports Having a head neck injury several months ago. This has created chronic back, shoulder, and neck pain which has been difficult to eradicate over the past several months. At the time of her injury, she had a CT scan of her head and her neck but no abdomen and pelvis or chest CT was performed. She subsequently has had further workup in the weeks and months after her scan including a CT scan of the chest performed last month. I have independently reviewed her outside CT scan of the chest where the right renal artery aneurysm is seen on the lower cuts.  This is about 2 cm and appears to be beyond the primary branches of the right renal artery. There does appear to be flow in the aneurysm and it is highly calcific. There does not appear to be any aneurysmal disease or stenosis in the left renal artery. She does not really have any atherosclerotic disease to speak of that is appreciable in her aorta or branch vessels. This was not a CT angiogram and not a thin cuts scan so it is difficult to discern if there is any fibromuscular dysplasia or other subtle vascular findings which could create aneurysmal disease in the right renal artery. The patient denies a previous history of hypertension. She is a reasonably healthy woman with only a history of skin cancer, and the musculoskeletal issues. She does have a previous history of tobacco use but quit about 30 years ago. She denies a previous history of stroke, TIA, or focal neurologic symptoms.   Past Medical History:  Diagnosis Date  . Cancer ([place])    skin ca    Past Surgical History:  Procedure Laterality Date  . FINGER SURGERY       Family History  Problem Relation Age of Onset  . Hypertension Mother   . Breast cancer Mother   . Cancer Mother   . Breast cancer Father   No bleeding disorders or clotting disorders  Social History Social History  Substance Use Topics  . Smoking status: Former Research scientist (life sciences)  . Smokeless tobacco: Never Used     Comment: Smoked for about 5 years, smoked about 1 pack per day. Quit in 1984  . Alcohol use Yes     Comment: Occasional alcohol use; Drinks 2-4 glasses of wine per week.  No IVDU  No Known Allergies  Current Outpatient Prescriptions  Medication Sig Dispense Refill  . baclofen (LIORESAL) 10 MG tablet Take 0.5-1 tablets (5-10 mg total) by mouth 3 (three) times daily. 30 each 1  . calcium carbonate 1250 MG capsule Take 1,250 mg by mouth daily.    . Cholecalciferol (VITAMIN D-3) 1000 UNITS CAPS Take 1 capsule by mouth daily.    . Cyanocobalamin (VITAMIN B12 PO) Take by mouth daily.    Randell Loop Prim-Borage (FLAX OIL XTRA) CAPS Take by mouth.    Marland Kitchen ibuprofen (ADVIL,MOTRIN) 600 MG tablet Take 1 tablet (600 mg total) by mouth every 8 (eight) hours as needed. 90 tablet 1  . Multiple Vitamin tablet Take by mouth.    . Omega-3 Fatty Acids (FISH OIL) 1200 MG  CPDR Take by mouth daily.    Marland Kitchen gabapentin (NEURONTIN) 100 MG capsule Take 1 capsule (100 mg total) by mouth 3 (three) times daily. (Patient not taking: Reported on 01/05/2017) 30 capsule 0   No current facility-administered medications for this visit.       REVIEW OF SYSTEMS (Negative unless checked)  Constitutional: '[]' Weight loss  '[]' Fever  '[]' Chills Cardiac: '[]' Chest pain   '[]' Chest pressure   '[]' Palpitations   '[]' Shortness of breath when laying flat   '[]' Shortness of breath at rest   '[]' Shortness of breath with exertion. Vascular:  '[]' Pain in legs with walking   '[]' Pain in legs at rest   '[]' Pain in legs when laying flat   '[]' Claudication   '[]' Pain in feet when walking  '[]' Pain in feet at rest  '[]' Pain in feet when laying flat    '[]' History of DVT   '[]' Phlebitis   '[]' Swelling in legs   '[]' Varicose veins   '[]' Non-healing ulcers Pulmonary:   '[]' Uses home oxygen   '[]' Productive cough   '[]' Hemoptysis   '[]' Wheeze  '[]' COPD   '[]' Asthma Neurologic:  '[]' Dizziness  '[]' Blackouts   '[]' Seizures   '[]' History of stroke   '[]' History of TIA  '[]' Aphasia   '[]' Temporary blindness   '[]' Dysphagia   '[]' Weakness or numbness in arms   '[]' Weakness or numbness in legs Musculoskeletal:  '[x]' Arthritis   '[]' Joint swelling   '[]' Joint pain   '[x]' Low back pain Hematologic:  '[]' Easy bruising  '[]' Easy bleeding   '[]' Hypercoagulable state   '[]' Anemic  '[]' Hepatitis Gastrointestinal:  '[]' Blood in stool   '[]' Vomiting blood  '[]' Gastroesophageal reflux/heartburn   '[]' Abdominal pain Genitourinary:  '[]' Chronic kidney disease   '[]' Difficult urination  '[]' Frequent urination  '[]' Burning with urination   '[]' Hematuria Skin:  '[]' Rashes   '[]' Ulcers   '[]' Wounds Psychological:  '[]' History of anxiety   '[]'  History of major depression.    Physical Exam BP 124/79   Pulse 69   Resp 17   Ht '5\' 3"'  (1.6 m)   Wt 136 lb (61.7 kg)   BMI 24.09 kg/m  Gen:  WD/WN, NAD. Appears younger than stated age Head: [place]/AT, No temporalis wasting. Prominent temp pulse not noted. Ear/Nose/Throat: Hearing grossly intact, nares w/o erythema or drainage, oropharynx w/o Erythema/Exudate Eyes: Conjunctiva clear, sclera non-icteric  Neck: trachea midline.  No JVD.  Pulmonary:  Good air movement, respirations not labored, no use of accessory muscles.  Cardiac: RRR, normal S1, S2. Vascular:  Vessel Right Left  Radial Palpable Palpable                                   Gastrointestinal: soft, non-tender/non-distended. No guarding/reflex. No masses, surgical incisions, or scars. Musculoskeletal: M/S 5/5 throughout.  Extremities without ischemic changes.  No deformity or atrophy. No edema. Neurologic: Sensation grossly intact in extremities.  Symmetrical.  Speech is fluent. Motor exam as listed above. Psychiatric: Judgment intact,  Mood & affect appropriate for pt's clinical situation. Dermatologic: No rashes or ulcers noted.  No cellulitis or open wounds. Lymph : No Cervical, Axillary, or Inguinal lymphadenopathy  Data Reviewed I have independently reviewed her outside CT scan of the chest where the right renal artery aneurysm is seen on the lower cuts.  This is about 2 cm and appears to be beyond the primary branches of the right renal artery. There does appear to be flow in the aneurysm and it is highly calcific.  Radiology Dg Thoracic Spine 2 View  Result Date: 12/23/2016  CLINICAL DATA:  Mid back pain, spinal stenosis EXAM: THORACIC SPINE 2 VIEWS COMPARISON:  None. FINDINGS: The thoracic vertebrae are in normal alignment. Only mild anterior osteophyte formation is present in the lower thoracic spine. No compression deformity is seen. No prominent paravertebral soft tissue is noted. Degenerative disc disease is present at C4-5, C5-6, and C6-7 levels. On the lateral view C4 appears somewhat dense, a finding of questionable significance due to bony overlap. Multiple calcified granulomas are present throughout the lungs on the frontal view. A calcification to the right of L1 on the frontal view may be vascular in origin. IMPRESSION: 1. Mild degenerative change in the mid to lower thoracic spine. No compression deformity. 2. Degenerative disc disease at C4-5, C5-6 and C6-7 levels. 3. Somewhat sclerotic appearance of C4 of questionable significance. 4. Multiple calcified granulomas throughout the lungs. Electronically Signed   By: Ivar Drape M.D.   On: 12/23/2016 13:55    Labs Recent Results (from the past 2160 hour(s))  Pap IG w/ reflex to HPV when ASC-U     Status: None   Collection Time: 10/28/16  3:41 PM  Result Value Ref Range   DIAGNOSIS: Comment     Comment: NEGATIVE FOR INTRAEPITHELIAL LESION AND MALIGNANCY.   Specimen adequacy: Comment     Comment: Satisfactory for evaluation. Endocervical and/or squamous  metaplastic cells (endocervical component) are present.    CLINICIAN PROVIDED ICD10: Comment     Comment: Z12.4   Performed by: Comment     Comment: Micheline Rough, Cytotechnologist (ASCP)   PAP SMEAR COMMENT .    Note: Comment     Comment: The Pap smear is a screening test designed to aid in the detection of premalignant and malignant conditions of the uterine cervix.  It is not a diagnostic procedure and should not be used as the sole means of detecting cervical cancer.  Both false-positive and false-negative reports do occur.    Test Methodology Comment     Comment: This liquid based ThinPrep(R) pap test was screened with the use of an image guided system.    PAP REFLEX: Comment     Comment: The HPV DNA reflex criteria were not met with this specimen result therefore, no HPV testing was performed.   CBC with Differential/Platelet     Status: None   Collection Time: 10/29/16  8:26 AM  Result Value Ref Range   WBC 5.1 3.4 - 10.8 x10E3/uL   RBC 4.74 3.77 - 5.28 x10E6/uL   Hemoglobin 14.3 11.1 - 15.9 g/dL   Hematocrit 42.2 34.0 - 46.6 %   MCV 89 79 - 97 fL   MCH 30.2 26.6 - 33.0 pg   MCHC 33.9 31.5 - 35.7 g/dL   RDW 13.0 12.3 - 15.4 %   Platelets 307 150 - 379 x10E3/uL   Neutrophils 66 Not Estab. %   Lymphs 23 Not Estab. %   Monocytes 9 Not Estab. %   Eos 1 Not Estab. %   Basos 1 Not Estab. %   Neutrophils Absolute 3.4 1.4 - 7.0 x10E3/uL   Lymphocytes Absolute 1.2 0.7 - 3.1 x10E3/uL   Monocytes Absolute 0.5 0.1 - 0.9 x10E3/uL   EOS (ABSOLUTE) 0.1 0.0 - 0.4 x10E3/uL   Basophils Absolute 0.0 0.0 - 0.2 x10E3/uL   Immature Granulocytes 0 Not Estab. %   Immature Grans (Abs) 0.0 0.0 - 0.1 x10E3/uL  Comprehensive metabolic panel     Status: None   Collection Time: 10/29/16  8:26 AM  Result Value  Ref Range   Glucose 93 65 - 99 mg/dL   BUN 13 6 - 24 mg/dL   Creatinine, Ser 0.77 0.57 - 1.00 mg/dL   GFR calc non Af Amer 88 >59 mL/min/1.73   GFR calc Af Amer 102 >59  mL/min/1.73   BUN/Creatinine Ratio 17 9 - 23   Sodium 143 134 - 144 mmol/L   Potassium 4.7 3.5 - 5.2 mmol/L   Chloride 104 96 - 106 mmol/L   CO2 23 18 - 29 mmol/L   Calcium 9.5 8.7 - 10.2 mg/dL   Total Protein 7.0 6.0 - 8.5 g/dL   Albumin 4.2 3.5 - 5.5 g/dL   Globulin, Total 2.8 1.5 - 4.5 g/dL   Albumin/Globulin Ratio 1.5 1.2 - 2.2   Bilirubin Total 0.4 0.0 - 1.2 mg/dL   Alkaline Phosphatase 78 39 - 117 IU/L   AST 18 0 - 40 IU/L   ALT 14 0 - 32 IU/L  Hemoglobin A1c     Status: None   Collection Time: 10/29/16  8:26 AM  Result Value Ref Range   Hgb A1c MFr Bld 5.3 4.8 - 5.6 %    Comment:          Pre-diabetes: 5.7 - 6.4          Diabetes: >6.4          Glycemic control for adults with diabetes: <7.0    Est. average glucose Bld gHb Est-mCnc 105 mg/dL  Lipid panel     Status: None   Collection Time: 10/29/16  8:26 AM  Result Value Ref Range   Cholesterol, Total 186 100 - 199 mg/dL   Triglycerides 75 0 - 149 mg/dL   HDL 84 >39 mg/dL   VLDL Cholesterol Cal 15 5 - 40 mg/dL   LDL Calculated 87 0 - 99 mg/dL   Chol/HDL Ratio 2.2 0.0 - 4.4 ratio units    Comment:                                   T. Chol/HDL Ratio                                             Men  Women                               1/2 Avg.Risk  3.4    3.3                                   Avg.Risk  5.0    4.4                                2X Avg.Risk  9.6    7.1                                3X Avg.Risk 23.4   11.0   TSH     Status: None   Collection Time: 10/29/16  8:26 AM  Result Value Ref Range   TSH 1.170 0.450 - 4.500 uIU/mL  Hepatitis  C antibody     Status: None   Collection Time: 10/29/16  8:26 AM  Result Value Ref Range   Hep C Virus Ab <0.1 0.0 - 0.9 s/co ratio    Comment:                                   Negative:     < 0.8                              Indeterminate: 0.8 - 0.9                                   Positive:     > 0.9  The CDC recommends that a positive HCV antibody result  be  followed up with a HCV Nucleic Acid Amplification  test ([id number]).     Assessment/Plan:  1. 2 cm Complex right renal artery aneurysm. I have independently reviewed her outside CT scan of the chest where the right renal artery aneurysm is seen on the lower cuts.  This is about 2 cm and appears to be beyond the primary branches of the right renal artery. There does appear to be flow in the aneurysm and it is highly calcific. There does not appear to be any aneurysmal disease or stenosis in the left renal artery. She does not really have any atherosclerotic disease to speak of that is appreciable in her aorta or branch vessels. This was not a CT angiogram and not a thin cuts scan so it is difficult to discern if there is any fibromuscular dysplasia or other subtle vascular findings which could create aneurysmal disease in the right renal artery. I had a long discussion with the patient today regarding this finding. This is a complex and difficult problem and given her young age, and otherwise reasonably healthy status, an aneurysm in the renal artery at this size clearly needs to be repaired this scan is not going to provide [place] enough information to determine the best way for treatment. I would recommend a renal artery angiogram to be performed at her convenience in the near future. I have discussed that most therapies for repair of her aneurysm would be done in an endovascular fashion, and it is possible that we would be able to do that concomitantly with her diagnostic angiogram if the products are available. I have discussed that some individuals might require open surgical therapy for repair of this renal artery aneurysm and that would be determined at the time of the angiogram. I have also discussed that complete nephrectomy could be required depending on the findings of angiogram as well. As far she knows, she has normal renal function with no history of kidney disease and a normal left kidney on the CT  scan as well. I have discussed the complexities of the situation and the importance of the angiogram in terms of planning treatment and potentially providing therapy concomitantly. All the patient's questions were answered. She would like to go home and discuss the situation with her husband and will call our office to schedule the angiogram.     Leotis Pain 01/05/2017, 4:34 PM   This note was created with Dragon medical transcription system.  Any errors from dictation are unintentional.

## 2017-01-06 ENCOUNTER — Telehealth: Payer: Self-pay | Admitting: Physician Assistant

## 2017-01-06 NOTE — Assessment & Plan Note (Signed)
I have independently reviewed her outside CT scan of the chest where the right renal artery aneurysm is seen on the lower cuts.  This is about 2 cm and appears to be beyond the primary branches of the right renal artery. There does appear to be flow in the aneurysm and it is highly calcific. There does not appear to be any aneurysmal disease or stenosis in the left renal artery. She does not really have any atherosclerotic disease to speak of that is appreciable in her aorta or branch vessels. This was not a CT angiogram and not a thin cuts scan so it is difficult to discern if there is any fibromuscular dysplasia or other subtle vascular findings which could create aneurysmal disease in the right renal artery. I had a long discussion with the patient today regarding this finding. This is a complex and difficult problem and given her young age, and otherwise reasonably healthy status, an aneurysm in the renal artery at this size clearly needs to be repaired this scan is not going to provide [place] enough information to determine the best way for treatment. I would recommend a renal artery angiogram to be performed at her convenience in the near future. I have discussed that most therapies for repair of her aneurysm would be done in an endovascular fashion, and it is possible that we would be able to do that concomitantly with her diagnostic angiogram if the products are available. I have discussed that some individuals might require open surgical therapy for repair of this renal artery aneurysm and that would be determined at the time of the angiogram. I have also discussed that complete nephrectomy could be required depending on the findings of angiogram as well. As far she knows, she has normal renal function with no history of kidney disease and a normal left kidney on the CT scan as well. I have discussed the complexities of the situation and the importance of the angiogram in terms of planning treatment and  potentially providing therapy concomitantly. All the patient's questions were answered. She would like to go home and discuss the situation with her husband and will call our office to schedule the angiogram.

## 2017-01-06 NOTE — Telephone Encounter (Signed)
Pt would like Jenni to return her call to discuss the aneurysm that was found on her CT scan. Pt also wanted to discuss who Tawanna Sat would recommend pt to get a second opinion. Thanks TNP

## 2017-01-06 NOTE — Patient Instructions (Signed)
Angiogram  An angiogram is an X-ray test. It is used to look at your blood vessels. For this test, a dye is put into the blood vessel being checked. The dye shows up on X-rays. It helps your doctor see if there is a blockage or other problem in the blood vessel.  What happens before the procedure?   Follow your doctor's instructions about limiting what you eat or drink.   Ask your doctor if you may drink enough water to take any needed medicines the morning of the test.   Plan to have someone take you home after the test.   If you go home the same day as the test, plan to have someone stay with you for 24 hours.  What happens during the procedure?   An IV tube will be put into one of your veins.   You will be given a medicine that makes you relax (sedative).   Your skin will be washed where the thin tube (catheter) will be put in. Hair may be removed from this area. The tube may be put into:  ? Your upper leg area (groin).  ? The fold of your arm, near your elbow.  ? Your wrist.   You will be given a medicine that numbs the area where the tube will be inserted (local anesthetic).   The tube will be inserted into a blood vessel.   Using a type of X-ray (fluoroscopy) to see, your doctor will move the tube into the blood vessel to check it.   Dye will be put in through the tube. X-rays of your blood vessels will then be taken.  Different doctors and hospitals may do this procedure differently.  What happens after the procedure?   If the test is done through the leg, you will be kept in bed lying flat for several hours. You will be told to not bend or cross your legs.   The area where the tube was inserted will be checked often.   The pulse in your feet or wrist will be checked often.   More tests or X-rays may be done.  This information is not intended to replace advice given to you by your health care provider. Make sure you discuss any questions you have with your health care provider.  Document  Released: 03/12/2009 Document Revised: 05/21/2016 Document Reviewed: 05/17/2013  [employer]vier Interactive Patient Education  2017 [employer]

## 2017-01-06 NOTE — Telephone Encounter (Signed)
Called patient and left VM

## 2017-01-06 NOTE — Telephone Encounter (Signed)
Spoke with patient and answered all her questions. She is going to stay with Dr. Lucky Cowboy and contact his office to schedule her renal angiogram.

## 2017-01-07 ENCOUNTER — Encounter (INDEPENDENT_AMBULATORY_CARE_PROVIDER_SITE_OTHER): Payer: Self-pay

## 2017-01-08 ENCOUNTER — Ambulatory Visit (INDEPENDENT_AMBULATORY_CARE_PROVIDER_SITE_OTHER): Payer: BLUE CROSS/BLUE SHIELD | Admitting: Vascular Surgery

## 2017-01-12 ENCOUNTER — Ambulatory Visit (INDEPENDENT_AMBULATORY_CARE_PROVIDER_SITE_OTHER): Payer: BLUE CROSS/BLUE SHIELD | Admitting: Vascular Surgery

## 2017-01-12 ENCOUNTER — Encounter (INDEPENDENT_AMBULATORY_CARE_PROVIDER_SITE_OTHER): Payer: Self-pay | Admitting: Vascular Surgery

## 2017-01-12 ENCOUNTER — Other Ambulatory Visit (INDEPENDENT_AMBULATORY_CARE_PROVIDER_SITE_OTHER): Payer: Self-pay | Admitting: Vascular Surgery

## 2017-01-12 VITALS — BP 119/65 | HR 77 | Resp 16 | Wt 137.0 lb

## 2017-01-12 DIAGNOSIS — I722 Aneurysm of renal artery: Secondary | ICD-10-CM | POA: Diagnosis not present

## 2017-01-12 NOTE — Progress Notes (Signed)
Subjective:    Patient ID: Natalie Dickerson, female    DOB: 19-Dec-1961, 56 y.o.   MRN: [id number] Chief Complaint  Patient presents with  . Follow-up   Patient presents to discuss upcoming right renal artery angiogram. She was seen on 01/05/17 in regard to a 2cm right renal artery aneurysm found incidentally on CT. We discussed the indications, procedure, risks and benefits and possible complications that may occur. All of her questions were answered.    Review of Systems  Constitutional: Negative.   HENT: Negative.   Eyes: Negative.   Respiratory: Negative.   Cardiovascular: Negative.   Gastrointestinal: Negative.   Endocrine: Negative.   Genitourinary:       Right Renal Artery Aneurysm  Musculoskeletal: Positive for back pain, myalgias, neck pain and neck stiffness.  Skin: Negative.   Allergic/Immunologic: Negative.   Neurological: Negative.   Hematological: Negative.   Psychiatric/Behavioral: Negative.       Objective:   Physical Exam  Gen:  WD/WN, NAD. Appears younger than stated age Head: [place]/AT, No temporalis wasting. Prominent temp pulse not noted. Ear/Nose/Throat: Hearing grossly intact, nares w/o erythema or drainage, oropharynx w/o Erythema/Exudate Eyes: Conjunctiva clear, sclera non-icteric  Neck: trachea midline.  No JVD.  Pulmonary:  Good air movement, respirations not labored, no use of accessory muscles.  Cardiac: RRR, normal S1, S2. Vascular:  Vessel Right Left  Radial Palpable Palpable                                   Gastrointestinal: soft, non-tender/non-distended. No guarding/reflex. No masses, surgical incisions, or scars. Musculoskeletal: M/S 5/5 throughout.  Extremities without ischemic changes.  No deformity or atrophy. No edema. Neurologic: Sensation grossly intact in extremities.  Symmetrical.  Speech is fluent. Motor exam as listed above. Psychiatric: Judgment intact, Mood & affect appropriate for pt's clinical  situation. Dermatologic: No rashes or ulcers noted.  No cellulitis or open wounds. Lymph : No Cervical, Axillary, or Inguinal lymphadenopathy  BP 119/65   Pulse 77   Resp 16   Wt 137 lb (62.1 kg)   BMI 24.27 kg/m   Past Medical History:  Diagnosis Date  . Cancer ([place])    skin ca   Social History   Social History  . Marital status: Married    Spouse name: N/A  . Number of children: N/A  . Years of education: N/A   Occupational History  . Not on file.   Social History Main Topics  . Smoking status: Former Research scientist (life sciences)  . Smokeless tobacco: Never Used     Comment: Smoked for about 5 years, smoked about 1 pack per day. Quit in 1984  . Alcohol use Yes     Comment: Occasional alcohol use; Drinks 2-4 glasses of wine per week.  . Drug use: No  . Sexual activity: Not on file   Other Topics Concern  . Not on file   Social History Narrative  . No narrative on file    Past Surgical History:  Procedure Laterality Date  . FINGER SURGERY     Family History  Problem Relation Age of Onset  . Hypertension Mother   . Breast cancer Mother   . Cancer Mother   . Breast cancer Father     Allergies  Allergen Reactions  . Bee Venom Anaphylaxis      Assessment & Plan:  Patient presents to discuss upcoming right renal artery angiogram. She  was seen on 01/05/17 in regard to a 2cm right renal artery aneurysm found incidentally on CT. We discussed the indications, procedure, risks and benefits and possible complications that may occur. All of her questions were answered.   1. Renal artery aneurysm of native kidney (HCC) - Stable At this time, the patient is willing to move forward with the angiogram. Outside CT scan of the chest where the right renal artery aneurysm is seen on the lower cuts. This is about 2 cm and appears to be beyond the primary branches of the right renal artery. There does appear to be flow in the aneurysm and it is highly calcific.There does not appear to be any  aneurysmal disease or stenosis in the left renal artery. She does not really have any atherosclerotic disease to speak of that is appreciable in her aorta or branch vessels. This was not a CT angiogram and not a thin cuts scan so it is difficult to discern if there is any fibromuscular dysplasia or other subtle vascular findings which could create aneurysmal disease in the right renal artery. I had a long discussion with the patient today regarding this finding. This is a complex and difficult problem and given her young age, and otherwise reasonably healthy status, an aneurysm in the renal artery at this size clearly needs to be repaired and continue to recommend a renal artery angiogram to be performed at her convenience in the near future. I have discussed that most therapies for repair of her aneurysm would be done in an endovascular fashion, and it is possible that we would be able to do that concomitantly with her diagnostic angiogram if the products are available. I have discussed that some individuals might require open surgical therapy for repair of this renal artery aneurysm and that would be determined at the time of the angiogram. I have also discussed that complete nephrectomy could be required depending on the findings of angiogram as well. I have reiterated the complexities of the situation and the importance of the angiogram in terms of planning treatment and potentially providing therapy concomitantly.   Current Outpatient Prescriptions on File Prior to Visit  Medication Sig Dispense Refill  . baclofen (LIORESAL) 10 MG tablet Take 0.5-1 tablets (5-10 mg total) by mouth 3 (three) times daily. 30 each 1  . Calcium Carbonate-Vit D-Min (CALCIUM 1200) 1200-1000 MG-UNIT CHEW Chew 1 each by mouth daily.    Marland Kitchen ibuprofen (ADVIL,MOTRIN) 600 MG tablet Take 1 tablet (600 mg total) by mouth every 8 (eight) hours as needed. 90 tablet 1  . Multiple Vitamins-Minerals (MULTIVITAMIN WITH MINERALS) tablet Take 1  tablet by mouth daily.    . Omega-3 Fatty Acids (FISH OIL) 1000 MG CAPS Take 1 capsule by mouth daily.    . vitamin B-12 (CYANOCOBALAMIN) 1000 MCG tablet Take 1,000 mcg by mouth daily.     No current facility-administered medications on file prior to visit.     There are no Patient Instructions on file for this visit. No Follow-up on file.   Lyric Rossano A Madalene Mickler, PA-C

## 2017-01-15 ENCOUNTER — Encounter
Admission: RE | Admit: 2017-01-15 | Discharge: 2017-01-15 | Disposition: A | Discharge status: Home / Self Care | Payer: BLUE CROSS/BLUE SHIELD | Source: Ambulatory Visit | Attending: Vascular Surgery | Admitting: Vascular Surgery

## 2017-01-15 DIAGNOSIS — Z8249 Family history of ischemic heart disease and other diseases of the circulatory system: Secondary | ICD-10-CM | POA: Diagnosis not present

## 2017-01-15 DIAGNOSIS — Z85828 Personal history of other malignant neoplasm of skin: Secondary | ICD-10-CM | POA: Diagnosis not present

## 2017-01-15 DIAGNOSIS — Z87891 Personal history of nicotine dependence: Secondary | ICD-10-CM | POA: Diagnosis not present

## 2017-01-15 DIAGNOSIS — Z803 Family history of malignant neoplasm of breast: Secondary | ICD-10-CM | POA: Diagnosis not present

## 2017-01-15 DIAGNOSIS — Z01812 Encounter for preprocedural laboratory examination: Secondary | ICD-10-CM | POA: Insufficient documentation

## 2017-01-15 DIAGNOSIS — I722 Aneurysm of renal artery: Secondary | ICD-10-CM

## 2017-01-15 DIAGNOSIS — Z809 Family history of malignant neoplasm, unspecified: Secondary | ICD-10-CM | POA: Diagnosis not present

## 2017-01-15 DIAGNOSIS — Z91030 Bee allergy status: Secondary | ICD-10-CM | POA: Diagnosis not present

## 2017-01-15 LAB — BUN: BUN: 14 mg/dL (ref 6–20)

## 2017-01-15 LAB — CREATININE, SERUM
Creatinine, Ser: 0.71 mg/dL (ref 0.44–1.00)
GFR calc Af Amer: >60 mL/min (ref >60)
GFR calc non Af Amer: >60 mL/min (ref >60)

## 2017-01-15 NOTE — Patient Instructions (Signed)
[facility]  Your procedure is scheduled with Dr. Lucky Cowboy                          On:  Date:    January 18, 2017                Time: 11:15 am     On arrival go [facility] on first floor of the [facility]. Your arrival time will be approximately 1-1.5 hours prior to you procedure start time. This allows time to check lab results and prep you for the procedure.  If your procedure time change you will be notified by the doctor's office.    _x____Do not eat or drink 8 hours prior to your procedure.    __x___Take all your morning medications with sips of water.   _____Bring your current medications in their bottle with you.    Your recovery time will be 1-2 hours (1 hour for a  vein stick, 2 hours for an artery stick )  Please call Dr Lucky Cowboy and Dr Nino Parsley office with any questions or concerns: [phone].  You will need to have someone drive you home and stay with you the night of the procedure.

## 2017-01-18 ENCOUNTER — Encounter
Admission: RE | Disposition: A | Discharge status: Home / Self Care | Payer: Self-pay | Source: Ambulatory Visit | Attending: Vascular Surgery

## 2017-01-18 ENCOUNTER — Observation Stay
Admission: RE | Admit: 2017-01-18 | Discharge: 2017-01-20 | Disposition: A | Discharge status: Home / Self Care | Payer: BLUE CROSS/BLUE SHIELD | Source: Ambulatory Visit | Attending: Vascular Surgery | Admitting: Vascular Surgery

## 2017-01-18 DIAGNOSIS — Z803 Family history of malignant neoplasm of breast: Secondary | ICD-10-CM | POA: Insufficient documentation

## 2017-01-18 DIAGNOSIS — Z8249 Family history of ischemic heart disease and other diseases of the circulatory system: Secondary | ICD-10-CM | POA: Insufficient documentation

## 2017-01-18 DIAGNOSIS — Z91030 Bee allergy status: Secondary | ICD-10-CM | POA: Diagnosis not present

## 2017-01-18 DIAGNOSIS — Z809 Family history of malignant neoplasm, unspecified: Secondary | ICD-10-CM | POA: Diagnosis not present

## 2017-01-18 DIAGNOSIS — I722 Aneurysm of renal artery: Secondary | ICD-10-CM | POA: Diagnosis not present

## 2017-01-18 DIAGNOSIS — Z85828 Personal history of other malignant neoplasm of skin: Secondary | ICD-10-CM | POA: Diagnosis not present

## 2017-01-18 DIAGNOSIS — Z87891 Personal history of nicotine dependence: Secondary | ICD-10-CM | POA: Insufficient documentation

## 2017-01-18 HISTORY — PX: PERIPHERAL VASCULAR CATHETERIZATION: SHX172C

## 2017-01-18 SURGERY — RENAL ANGIOGRAPHY
Anesthesia: Moderate Sedation

## 2017-01-18 MED ORDER — ASPIRIN EC 81 MG PO TBEC
81.0000 mg | DELAYED_RELEASE_TABLET | Freq: Every day | ORAL | Status: DC
  Administered 2017-01-19 – 2017-01-20 (×2): 81 mg via ORAL
  Filled 2017-01-18 (×2): qty 1

## 2017-01-18 MED ORDER — SODIUM CHLORIDE 0.9 % IV SOLN
INTRAVENOUS | Status: DC
  Administered 2017-01-18: 22:00:00 via INTRAVENOUS

## 2017-01-18 MED ORDER — MIDAZOLAM HCL 5 MG/5ML IJ SOLN
INTRAMUSCULAR | Status: AC
  Filled 2017-01-18: qty 5

## 2017-01-18 MED ORDER — MIDAZOLAM HCL 2 MG/2ML IJ SOLN
INTRAMUSCULAR | Status: AC
  Filled 2017-01-18: qty 2

## 2017-01-18 MED ORDER — FAMOTIDINE 20 MG PO TABS: 40.0000 mg | ORAL_TABLET | ORAL | Status: DC | PRN

## 2017-01-18 MED ORDER — SORBITOL 70 % SOLN
30.0000 mL | Freq: Every day | Status: DC | PRN
  Filled 2017-01-18: qty 30

## 2017-01-18 MED ORDER — MIDAZOLAM HCL 2 MG/2ML IJ SOLN
INTRAMUSCULAR | Status: DC | PRN
  Administered 2017-01-18 (×4): 1 mg via INTRAVENOUS
  Administered 2017-01-18: 2 mg via INTRAVENOUS
  Administered 2017-01-18: 1 mg via INTRAVENOUS

## 2017-01-18 MED ORDER — MORPHINE SULFATE (PF) 4 MG/ML IV SOLN
2.0000 mg | INTRAVENOUS | Status: DC | PRN
  Administered 2017-01-18 – 2017-01-19 (×6): 2 mg via INTRAVENOUS
  Filled 2017-01-18 (×6): qty 1

## 2017-01-18 MED ORDER — CALCIUM CITRATE-VITAMIN D 500-400 MG-UNIT PO CHEW
2.0000 | CHEWABLE_TABLET | Freq: Every day | ORAL | Status: DC
  Filled 2017-01-18 (×2): qty 2

## 2017-01-18 MED ORDER — FENTANYL CITRATE (PF) 100 MCG/2ML IJ SOLN
INTRAMUSCULAR | Status: DC | PRN
  Administered 2017-01-18 (×2): 50 ug via INTRAVENOUS
  Administered 2017-01-18 (×2): 25 ug via INTRAVENOUS
  Administered 2017-01-18: 50 ug via INTRAVENOUS

## 2017-01-18 MED ORDER — HYDROCODONE-ACETAMINOPHEN 5-325 MG PO TABS
ORAL_TABLET | ORAL | Status: AC
  Administered 2017-01-18: 14:00:00
  Filled 2017-01-18: qty 1

## 2017-01-18 MED ORDER — ONDANSETRON HCL 4 MG PO TABS: 4.0000 mg | ORAL_TABLET | Freq: Four times a day (QID) | ORAL | Status: DC | PRN

## 2017-01-18 MED ORDER — ONDANSETRON HCL 4 MG/2ML IJ SOLN
4.0000 mg | Freq: Four times a day (QID) | INTRAMUSCULAR | Status: DC | PRN
  Administered 2017-01-18: 4 mg via INTRAVENOUS

## 2017-01-18 MED ORDER — SODIUM CHLORIDE 0.9 % IV SOLN
INTRAVENOUS | Status: DC
  Administered 2017-01-18: 12:00:00 via INTRAVENOUS

## 2017-01-18 MED ORDER — ACETAMINOPHEN 325 MG PO TABS
650.0000 mg | ORAL_TABLET | Freq: Four times a day (QID) | ORAL | Status: DC | PRN
  Administered 2017-01-20: 650 mg via ORAL
  Filled 2017-01-18: qty 2

## 2017-01-18 MED ORDER — OXYCODONE-ACETAMINOPHEN 5-325 MG PO TABS
ORAL_TABLET | ORAL | Status: AC
  Filled 2017-01-18: qty 1

## 2017-01-18 MED ORDER — MORPHINE SULFATE (PF) 2 MG/ML IV SOLN
INTRAVENOUS | Status: AC
  Administered 2017-01-18: 2 mg via INTRAVASCULAR
  Filled 2017-01-18: qty 1

## 2017-01-18 MED ORDER — ONDANSETRON HCL 4 MG/2ML IJ SOLN: 4.0000 mg | Freq: Four times a day (QID) | INTRAMUSCULAR | Status: DC | PRN

## 2017-01-18 MED ORDER — CEFAZOLIN IN D5W 1 GM/50ML IV SOLN
1.0000 g | INTRAVENOUS | Status: AC
  Administered 2017-01-18: 1 g via INTRAVENOUS

## 2017-01-18 MED ORDER — MAGNESIUM HYDROXIDE 400 MG/5ML PO SUSP
30.0000 mL | Freq: Every day | ORAL | Status: DC | PRN
Start: 2017-01-18 — End: 2017-01-20

## 2017-01-18 MED ORDER — OMEGA-3-ACID ETHYL ESTERS 1 G PO CAPS
1.0000 g | ORAL_CAPSULE | Freq: Every day | ORAL | Status: DC
  Administered 2017-01-19: 1 g via ORAL
  Filled 2017-01-18 (×2): qty 1

## 2017-01-18 MED ORDER — OXYCODONE-ACETAMINOPHEN 5-325 MG PO TABS
2.0000 | ORAL_TABLET | Freq: Four times a day (QID) | ORAL | Status: DC | PRN
  Administered 2017-01-18 – 2017-01-20 (×5): 2 via ORAL
  Filled 2017-01-18 (×4): qty 2

## 2017-01-18 MED ORDER — HYDROCODONE-ACETAMINOPHEN 5-325 MG PO TABS
1.0000 | ORAL_TABLET | ORAL | Status: DC | PRN
  Administered 2017-01-18: 1 via ORAL
  Administered 2017-01-19 (×3): 2 via ORAL
  Filled 2017-01-18 (×4): qty 2

## 2017-01-18 MED ORDER — HEPARIN SODIUM (PORCINE) 1000 UNIT/ML IJ SOLN
INTRAMUSCULAR | Status: AC
  Filled 2017-01-18: qty 1

## 2017-01-18 MED ORDER — ADULT MULTIVITAMIN W/MINERALS CH
1.0000 | ORAL_TABLET | Freq: Every day | ORAL | Status: DC
  Filled 2017-01-18 (×2): qty 1

## 2017-01-18 MED ORDER — MANNITOL 25 % IV SOLN
INTRAVENOUS | Status: DC | PRN
  Administered 2017-01-18: 12.5 g via INTRAVENOUS

## 2017-01-18 MED ORDER — CALCIUM 1200 1200-1000 MG-UNIT PO CHEW: 1.0000 | CHEWABLE_TABLET | Freq: Every day | ORAL | Status: DC

## 2017-01-18 MED ORDER — HYDROMORPHONE HCL 1 MG/ML IJ SOLN: 1.0000 mg | Freq: Once | INTRAMUSCULAR | Status: DC

## 2017-01-18 MED ORDER — FENTANYL CITRATE (PF) 100 MCG/2ML IJ SOLN
INTRAMUSCULAR | Status: AC
  Filled 2017-01-18: qty 2

## 2017-01-18 MED ORDER — DOCUSATE SODIUM 100 MG PO CAPS
100.0000 mg | ORAL_CAPSULE | Freq: Two times a day (BID) | ORAL | Status: DC
  Administered 2017-01-19 – 2017-01-20 (×3): 100 mg via ORAL
  Filled 2017-01-18 (×3): qty 1

## 2017-01-18 MED ORDER — ACETAMINOPHEN 650 MG RE SUPP
650.0000 mg | Freq: Four times a day (QID) | RECTAL | Status: DC | PRN
  Filled 2017-01-18: qty 1

## 2017-01-18 MED ORDER — IOPAMIDOL (ISOVUE-300) INJECTION 61%
INTRAVENOUS | Status: DC | PRN
  Administered 2017-01-18: 105 mL via INTRA_ARTERIAL

## 2017-01-18 MED ORDER — DEXTROSE-NACL 5-0.9 % IV SOLN
INTRAVENOUS | Status: DC
Start: 2017-01-18 — End: 2017-01-18

## 2017-01-18 MED ORDER — HEPARIN SODIUM (PORCINE) 1000 UNIT/ML IJ SOLN
INTRAMUSCULAR | Status: DC | PRN
  Administered 2017-01-18: 2000 [IU] via INTRAVENOUS
  Administered 2017-01-18: 3000 [IU] via INTRAVENOUS

## 2017-01-18 MED ORDER — FLEET ENEMA 7-19 GM/118ML RE ENEM: 1.0000 | ENEMA | Freq: Once | RECTAL | Status: DC | PRN

## 2017-01-18 MED ORDER — PROMETHAZINE HCL 25 MG/ML IJ SOLN
25.0000 mg | Freq: Four times a day (QID) | INTRAMUSCULAR | Status: DC | PRN
  Administered 2017-01-18 – 2017-01-19 (×2): 25 mg via INTRAVENOUS
  Filled 2017-01-18 (×2): qty 1

## 2017-01-18 MED ORDER — IBUPROFEN 600 MG PO TABS
600.0000 mg | ORAL_TABLET | Freq: Once | ORAL | Status: AC
  Administered 2017-01-18: 600 mg via ORAL
  Filled 2017-01-18 (×2): qty 1

## 2017-01-18 MED ORDER — VITAMIN B-12 1000 MCG PO TABS
1000.0000 ug | ORAL_TABLET | Freq: Every day | ORAL | Status: DC
  Administered 2017-01-19: 1000 ug via ORAL
  Filled 2017-01-18 (×2): qty 1

## 2017-01-18 MED ORDER — ONDANSETRON HCL 4 MG/2ML IJ SOLN
INTRAMUSCULAR | Status: AC
  Filled 2017-01-18: qty 2

## 2017-01-18 MED ORDER — METHYLPREDNISOLONE SODIUM SUCC 125 MG IJ SOLR
125.0000 mg | INTRAMUSCULAR | Status: DC | PRN
Start: 2017-01-18 — End: 2017-01-18

## 2017-01-18 MED ORDER — MANNITOL 25 % IV SOLN
INTRAVENOUS | Status: AC
  Filled 2017-01-18: qty 50

## 2017-01-18 MED ORDER — HEPARIN (PORCINE) IN NACL 2-0.9 UNIT/ML-% IJ SOLN
INTRAMUSCULAR | Status: AC
  Filled 2017-01-18: qty 1000

## 2017-01-18 MED ORDER — LIDOCAINE-EPINEPHRINE (PF) 2 %-1:200000 IJ SOLN
INTRAMUSCULAR | Status: AC
  Filled 2017-01-18: qty 20

## 2017-01-18 SURGICAL SUPPLY — 27 items
BALLN ULTRVRSE 4X40X150 (BALLOONS) ×2
BALLN ULTRVRSE 4X40X150 OTW (BALLOONS) ×2
BALLOON ULTRVRSE 4X40X150 OTW (BALLOONS) ×2 IMPLANT
CATH BEACON 5.038 65CM KMP-01 (CATHETERS) ×4 IMPLANT
CATH C2 65CM (CATHETERS) ×4 IMPLANT
CATH LANTERN 025 115CM 45TIP (MISCELLANEOUS) ×4 IMPLANT
CATH PIG 70CM (CATHETERS) ×4 IMPLANT
COIL 400 COMPLEX SOFT 4X15CM (Vascular Products) ×4 IMPLANT
DEVICE OCCLUSION POD4 (Vascular Products) ×2 IMPLANT
DEVICE PRESTO INFLATION (MISCELLANEOUS) ×4 IMPLANT
DEVICE STARCLOSE SE CLOSURE (Vascular Products) ×4 IMPLANT
GLIDEWIRE ANGLED SS 035X260CM (WIRE) ×4 IMPLANT
HANDLE DETACHMENT COIL (MISCELLANEOUS) ×4 IMPLANT
OCCLUSION DEVICE POD4 (Vascular Products) ×4 IMPLANT
PACK ANGIOGRAPHY (CUSTOM PROCEDURE TRAY) ×4 IMPLANT
SHEATH ANL 5FRX45 (SHEATH) ×4 IMPLANT
SHEATH ANL2 6FRX45 HC (SHEATH) ×4 IMPLANT
SHEATH BRITE TIP 5FRX11 (SHEATH) ×4 IMPLANT
STENT VIABAHN 5X25X120 (Permanent Stent) ×4 IMPLANT
STENT VIABAHN 5X50X120 (Permanent Stent) ×4 IMPLANT
SYR MEDRAD MARK V 150ML (SYRINGE) ×4 IMPLANT
TUBING CONTRAST HIGH PRESS 72 (TUBING) ×4 IMPLANT
WIRE G 018X200 V18 (WIRE) ×4 IMPLANT
WIRE G.018X260 SHT (WIRE) ×4 IMPLANT
WIRE J 3MM .035X145CM (WIRE) ×4 IMPLANT
WIRE MAGIC TOR.035 180C (WIRE) ×4 IMPLANT
WIRE SPARTACORE .014X300CM (WIRE) ×4 IMPLANT

## 2017-01-18 NOTE — Progress Notes (Signed)
Pt nauseated and small emesis, zofran iv given, after which pt began calming with pain and nausea. sr during entire time, with vitals stable throughout. sats at 100% on room air,report given to [facility] on 2c with orders and plan reviewed.pt did state feeling some better, will take to floor 215 at this time.

## 2017-01-18 NOTE — Progress Notes (Signed)
Patient post angiogram/renal with coiling along with stent placement.husband at bedside. Dr Lucky Cowboy out after procedure speaking with patient and husband'/family, vitals have remained stable throughout recovery period . Dr Lucky Cowboy has told patient and husband that after this type procedure that she would have fair amts of pain thus being admitted for pain control, pt had norco at 1344, resting for awhile, waking up at 1615 having increasing pain, thus given 2 percocet, but after time, began being nervous and tearful. Vitals reamained stable during this time, then given morphine 2mg  morphine at 1655. At this time less pain, called Dr Lucky Cowboy and has pointed that this is why he admitted her overnight for pain control. I Spoke with patient and husband to reassure that this will improve, that this is why Dr Lucky Cowboy admitted her for further care. o2 sats remain at 100% room air,vitals 124/74, resp's at 16-20. No visible resp distress. Calling report to care nurse at this time.

## 2017-01-18 NOTE — H&P (Signed)
[facility] History & Physical Update  The patient was interviewed and re-examined.  The patient's previous History and Physical has been reviewed and is unchanged.  There is no change in the plan of care. We plan to proceed with the scheduled procedure.  Leotis Pain, MD  01/18/2017, 11:17 AM

## 2017-01-18 NOTE — Op Note (Signed)
[facility] Percutaneous Study/Intervention Procedural Note    Surgeon(s): [employer]  Assistants: None  Pre-operative Diagnosis: 2 cm right renal artery aneurysm seen on a chest CT performed for other reasons  Post-operative diagnosis: Same  Procedure(s) Performed: 1. Ultrasound guidance for vascular access right femoral artery 2. Catheter placement into left renal artery from right femoral approach and into first order and second order right renal artery branches from right femoral approach 3. Aortogram and selective bilateral renal angiogram 4. Coil embolization of the largest right renal artery aneurysm with two 4 mm coils  5.  Viabahn covered stent placement 2 in the main renal artery across primary and into the secondary branch of the right renal artery to exclude all 3 aneurysms with 5 mm diameter by 5 cm length stent and 5 mm diameter by 2.5 cm length stent 6. StarClose closure device right femoral artery  Contrast: 105 cc  EBL: 15 cc   Fluoro Time: 14.5 minutes  Moderate conscious sedation: Approximately 80 minutes with 7 mg of Versed and 200 mcg of Fentanyl  Indications: The patient is a healthy 56 year old female who had a 2 cm renal artery aneurysm on the right that was found on a chest CT performed for other reasons. Due to her young age and otherwise healthy status, treatment for this large renal artery aneurysm is indicated. Given the clinical scenario and the noninvasive findings, angiogram is indicated for further evaluation of her renal artery and potential treatment. Risks and benefits are discussed and informed consent is obtained.  Procedure: The patient was identified and appropriate procedural time out was performed. The patient was then placed supine on the table and prepped and draped in the usual sterile fashion.Moderate conscious sedation was  administered with a face to face encounter with the patient throughout the procedure with my supervision of the RN administering medicines and monitoring the patients vital signs and mental status throughout from the start of the procedure until the patient was taken to the recovery room  Ultrasound was used to evaluate the right common femoral artery. It was patent . A digital ultrasound image was acquired. A Seldinger needle was used to access the right common femoral artery under direct ultrasound guidance and a permanent image was performed. A 0.035 J wire was advanced without resistance and a 5Fr sheath was placed. Pigtail catheter was placed into the aorta at the L1 level and an AP aortogram was performed. This demonstrated a normal aorta and iliac arteries. There were 2 left renal arteries that were small to medium in size and codominant. It. Her artery was very tortuous but not overtly diseased. The lower artery was reasonably straight. The right renal artery had an obvious aneurysm at its initial branch which was a superior going branch and this was the largest aneurysm. It was not obvious on the initial aortogram, but there were also 2 medium-size aneurysms further along the main renal artery and into secondary branches.  To further elucidate the left kidney before treating the right which would require loss of a large portion or all of the right kidney, imaging was performed selectively with a C2 catheter in each of the left renal arteries. There was mild fibromuscular dysplasia seen particularly in the more superior of the 2 arteries which was tortuous and less obvious findings in the more inferior branch. Both arteries were widely patent without significant stenosis or aneurysmal degeneration with good nephrogram seen. I then turned my attention to the right renal  artery. The C2 catheter was used to selectively cannulate the right renal artery. There was a reasonably long segment of the main  renal artery of about 4-5 cm before the aneurysmal disease was seen. The largest aneurysm was at the initial branch of the right renal artery which was a superior going branch and then tracked anteriorly. This aneurysm measured about 2 cm in diameter. The main renal artery then continued with moderate sized aneurysms of just over a centimeter in diameter seen at the second and third branches and continuing into the main renal artery with a bifurcation about 1-1-1/2 cm below the lowest aneurysm. I felt the best way to treat these aneurysms would be to Quill embolize the largest renal artery aneurysm that was most proximal as well as the superior going branch off of the aneurysm and then to place a covered stent to exclude all 3 aneurysms in the main renal artery. This would sacrifice only about 50% of the kidney and treat all 3 aneurysms. I then heparinized the patient and gave 12 1/2 g of mannitol. A 6 French Ansell sheath was placed into the main right renal artery. I then used a buddy wire system and was able to get a 0.018 platinum plus wire out the main renal artery across all 3 aneurysms going to the inferior pole of the kidney. The penumbra microcatheter was then used to selectively cannulate the first renal artery branch which was off of the aneurysm and the superior going and get beyond the aneurysm into the artery itself. At this point, I deployed 2 penumbra 4 mm coils to exclude the superior going branch and then packed into the largest renal artery aneurysm. These were deployed through the microcatheter until the majority of the aneurysm was filled with coils. I then removed the microcatheter leaving only the 018 wire across this main aneurysm and the second third aneurysms as well. We did not have a 5 mm diameter by 75 mm length Viabahn stent that would have been required to cover all 3 aneurysms with 1 stent. We selected a 5 mm diameter by 5 cm length stent as well as a 5 mm diameter by 2.5 cm length  stent that went from the mid main renal artery about 2-3 cm on its origin with about 2 cm of seal proximally before the first aneurysm. This went down beyond the first 3 branches of the main renal artery into what would essentially be tertiary branches that were feeding almost half the kidney and excluded all 3 aneurysms. Care was taken to be just above the main bifurcation feeding the bottom half and the midportion of the kidney to preserve as much of the renal parenchyma as possible but exclude all 3 aneurysms. Proximally and at the overlap this was postdilated with a 4 mm balloon inflated to 16 atm to gain 4.5 mm in diameter. Completion angiogram following the coil embolization and the stent placement showed excellent placement of the stents and the coils with successful exclusion of all 3 aneurysms and successful coiling of the largest aneurysm and brisk flow remaining to the mid and inferior portion of the kidney likely saving about 50% of the renal parenchyma. There was no residual stenosis, dissection, or extravasation of blood identified. All 3 aneurysms had been successfully treated by this appearance.  The guide catheter was removed. Oblique arteriogram was performed of the right femoral artery and StarClose closure device was deployed in the usual fashion with excellent hemostatic result. The patient was taken  to the recovery room in stable condition having tolerated the procedure well.  Findings:  Aortogram/Renal Arteries:This demonstrated a normal aorta and iliac arteries. There were 2 left renal arteries that were small to medium in size and codominant. It. Her artery was very tortuous but not overtly diseased. The lower artery was reasonably straight. The right renal artery had an obvious aneurysm at its initial branch which was a superior going branch and this was the largest aneurysm. It was not obvious on the initial aortogram, but there were also 2 medium-size aneurysms further  along the main renal artery and into secondary branches. This is described in more detail in the body of the dictation above.   Condition:  Stable  Complications: None   Leotis Pain 01/18/2017 1:19 PM  This note was created with Dragon Medical transcription system. Any errors in dictation are purely unintentional.

## 2017-01-18 NOTE — Discharge Instructions (Signed)

## 2017-01-19 ENCOUNTER — Encounter: Payer: Self-pay | Admitting: Vascular Surgery

## 2017-01-19 DIAGNOSIS — Z85828 Personal history of other malignant neoplasm of skin: Secondary | ICD-10-CM | POA: Diagnosis not present

## 2017-01-19 DIAGNOSIS — Z8249 Family history of ischemic heart disease and other diseases of the circulatory system: Secondary | ICD-10-CM | POA: Diagnosis not present

## 2017-01-19 DIAGNOSIS — I722 Aneurysm of renal artery: Secondary | ICD-10-CM | POA: Diagnosis not present

## 2017-01-19 DIAGNOSIS — Z87891 Personal history of nicotine dependence: Secondary | ICD-10-CM | POA: Diagnosis not present

## 2017-01-19 DIAGNOSIS — Z803 Family history of malignant neoplasm of breast: Secondary | ICD-10-CM | POA: Diagnosis not present

## 2017-01-19 DIAGNOSIS — Z91030 Bee allergy status: Secondary | ICD-10-CM | POA: Diagnosis not present

## 2017-01-19 DIAGNOSIS — Z809 Family history of malignant neoplasm, unspecified: Secondary | ICD-10-CM | POA: Diagnosis not present

## 2017-01-19 LAB — BASIC METABOLIC PANEL
Anion gap: 9 (ref 5–15)
BUN: 14 mg/dL (ref 6–20)
CO2: 22 mmol/L (ref 22–32)
Calcium: 8.4 mg/dL — ABNORMAL LOW (ref 8.9–10.3)
Chloride: 108 mmol/L (ref 101–111)
Creatinine, Ser: 1.02 mg/dL — ABNORMAL HIGH (ref 0.44–1.00)
GFR calc Af Amer: >60 mL/min (ref >60)
GFR calc non Af Amer: >60 mL/min (ref >60)
Glucose, Bld: 136 mg/dL — ABNORMAL HIGH (ref 65–99)
Potassium: 4.3 mmol/L (ref 3.5–5.1)
Sodium: 139 mmol/L (ref 135–145)

## 2017-01-19 LAB — CBC
HCT: 33.3 % — ABNORMAL LOW (ref 35.0–47.0)
Hemoglobin: 11.5 g/dL — ABNORMAL LOW (ref 12.0–16.0)
MCH: 30.5 pg (ref 26.0–34.0)
MCHC: 34.4 g/dL (ref 32.0–36.0)
MCV: 88.6 fL (ref 80.0–100.0)
Platelets: 234 10*3/uL (ref 150–440)
RBC: 3.76 MIL/uL — ABNORMAL LOW (ref 3.80–5.20)
RDW: 12.3 % (ref 11.5–14.5)
WBC: 13.9 10*3/uL — ABNORMAL HIGH (ref 3.6–11.0)

## 2017-01-19 MED ORDER — ASPIRIN 81 MG PO TBEC
81.0000 mg | DELAYED_RELEASE_TABLET | Freq: Every day | ORAL | 3 refills | Status: AC
Start: 2017-01-19 — End: ?

## 2017-01-19 MED ORDER — OXYCODONE-ACETAMINOPHEN 5-325 MG PO TABS: 2.0000 | ORAL_TABLET | Freq: Four times a day (QID) | ORAL | 0 refills | Status: DC | PRN

## 2017-01-19 NOTE — Progress Notes (Signed)
[facility]  Daily Progress Note   Subjective  - 1 Day Post-Op  Feeling better but still with some pain and nausea.  No major events overnight  Objective Vitals:   01/18/17 1700 01/18/17 1747 01/19/17 0500 01/19/17 0656  BP: 121/74 105/67  (!) 142/93  Pulse: 80 66  90  Resp: (!) 28 16  18   Temp:  97.6 F (36.4 C)  99.5 F (37.5 C)  TempSrc:  Oral  Oral  SpO2: 100% 100%  98%  Weight:   141 lb 11.2 oz (64.3 kg)   Height:        Intake/Output Summary (Last 24 hours) at 01/19/17 0849 Last data filed at 01/19/17 0013  Gross per 24 hour  Intake                0 ml  Output              800 ml  Net             -800 ml    PULM  CTAB CV  RRR VASC  Access site C/D/I  Laboratory CBC    Component Value Date/Time   WBC 13.9 (H) 01/19/2017 0434   HGB 11.5 (L) 01/19/2017 0434   HCT 33.3 (L) 01/19/2017 0434   HCT 42.2 10/29/2016 0826   PLT 234 01/19/2017 0434   PLT 307 10/29/2016 0826    BMET    Component Value Date/Time   NA 139 01/19/2017 0434   NA 143 10/29/2016 0826   K 4.3 01/19/2017 0434   CL 108 01/19/2017 0434   CO2 22 01/19/2017 0434   GLUCOSE 136 (H) 01/19/2017 0434   BUN 14 01/19/2017 0434   BUN 13 10/29/2016 0826   CREATININE 1.02 (H) 01/19/2017 0434   CALCIUM 8.4 (L) 01/19/2017 0434   GFRNONAA >60 01/19/2017 0434   GFRAA >60 01/19/2017 0434    Assessment/Planning: POD #1 s/p coil and stenting for right renal artery aneurysms   Diet as tolerated and advance activity  Likely home later today or tomorrow    Leotis Pain  01/19/2017, 8:49 AM

## 2017-01-20 DIAGNOSIS — Z87891 Personal history of nicotine dependence: Secondary | ICD-10-CM | POA: Diagnosis not present

## 2017-01-20 DIAGNOSIS — Z8249 Family history of ischemic heart disease and other diseases of the circulatory system: Secondary | ICD-10-CM | POA: Diagnosis not present

## 2017-01-20 DIAGNOSIS — I722 Aneurysm of renal artery: Secondary | ICD-10-CM | POA: Diagnosis not present

## 2017-01-20 DIAGNOSIS — Z809 Family history of malignant neoplasm, unspecified: Secondary | ICD-10-CM | POA: Diagnosis not present

## 2017-01-20 DIAGNOSIS — Z85828 Personal history of other malignant neoplasm of skin: Secondary | ICD-10-CM | POA: Diagnosis not present

## 2017-01-20 DIAGNOSIS — Z91030 Bee allergy status: Secondary | ICD-10-CM | POA: Diagnosis not present

## 2017-01-20 DIAGNOSIS — Z803 Family history of malignant neoplasm of breast: Secondary | ICD-10-CM | POA: Diagnosis not present

## 2017-01-20 NOTE — Discharge Summary (Signed)
[facility]    Discharge Summary    Patient ID:  Natalie Dickerson MRN: [id number] DOB/AGE: 1960/12/29 56 y.o.  Admit date: 01/18/2017 Discharge date: 01/20/2017 Date of Surgery: 01/18/2017 Surgeon: Surgeon(s): Algernon Huxley, MD  Admission Diagnosis: RT renal angio    Renal artery aneurysm  Discharge Diagnoses:  RT renal angio    Renal artery aneurysm  Secondary Diagnoses: Past Medical History:  Diagnosis Date  . Cancer ([place])    skin ca    Procedure(s): Renal Angiography Renal Intervention  Discharged Condition: good  HPI:  Patient with multiple right renal artery aneurysms.  Here for treatment of these aneurysms  Hospital Course:  Natalie Dickerson is a 56 y.o. female is S/P Right Procedure(s): Renal Angiography Renal Intervention Extubated: NA Physical exam: access site C/D/I.  AF/VSS Post-op wounds clean, dry, intact or healing well Pt. Ambulating, voiding and taking PO diet without difficulty. Pt pain controlled with PO pain meds. Labs as below Complications:none  Consults:    Significant Diagnostic Studies: CBC Lab Results  Component Value Date   WBC 13.9 (H) 01/19/2017   HGB 11.5 (L) 01/19/2017   HCT 33.3 (L) 01/19/2017   MCV 88.6 01/19/2017   PLT 234 01/19/2017    BMET    Component Value Date/Time   NA 139 01/19/2017 0434   NA 143 10/29/2016 0826   K 4.3 01/19/2017 0434   CL 108 01/19/2017 0434   CO2 22 01/19/2017 0434   GLUCOSE 136 (H) 01/19/2017 0434   BUN 14 01/19/2017 0434   BUN 13 10/29/2016 0826   CREATININE 1.02 (H) 01/19/2017 0434   CALCIUM 8.4 (L) 01/19/2017 0434   GFRNONAA >60 01/19/2017 0434   GFRAA >60 01/19/2017 0434   COAG No results found for: INR, PROTIME   Disposition:  Discharge to :Home Discharge Instructions    Call MD for:  redness, tenderness, or signs of infection (pain, swelling, bleeding, redness, odor or green/yellow discharge around incision site)    Complete by:  As directed    Call MD for:  severe or increased pain, loss or decreased feeling  in affected limb(s)    Complete by:  As directed    Call MD for:  temperature >100.5    Complete by:  As directed    Driving Restrictions    Complete by:  As directed    No driving for 24 hours   No dressing needed    Complete by:  As directed    Replace only if drainage present   Resume previous diet    Complete by:  As directed      Allergies as of 01/20/2017      Reactions   Bee Venom Anaphylaxis      Medication List    TAKE these medications   aspirin 81 MG EC tablet Take 1 tablet (81 mg total) by mouth daily.   CALCIUM 1200 1200-1000 MG-UNIT Chew Chew 1 each by mouth daily.   Fish Oil 1000 MG Caps Take 1 capsule by mouth daily.   ibuprofen 600 MG tablet Commonly known as:  ADVIL,MOTRIN Take 1 tablet (600 mg total) by mouth every 8 (eight) hours as needed.   multivitamin with minerals tablet Take 1 tablet by mouth daily.   oxyCODONE-acetaminophen 5-325 MG tablet Commonly known as:  PERCOCET/ROXICET Take 2 tablets by mouth every 6 (six) hours as needed for moderate pain.   vitamin B-12 1000 MCG tablet Commonly known as:  CYANOCOBALAMIN Take 1,000 mcg  by mouth daily.      Verbal and written Discharge instructions given to the patient. Wound care per Discharge AVS Follow-up Information    Leotis Pain, MD. Go on 03/02/2017.   Specialties:  Vascular Surgery, Radiology, Interventional Cardiology Why:  Tuesday at 9:00am with renal artery duplex Contact information: [place] [address] [phone]           Signed: Leotis Pain, MD  01/20/2017, 1:20 PM

## 2017-01-20 NOTE — Progress Notes (Signed)
01/20/2017 10:53 AM  BP 121/75 (BP Location: Right Arm)   Pulse 90   Temp 98.7 F (37.1 C) (Oral)   Resp 18   Ht 5\' 3"  (1.6 m)   Wt 61.3 kg (135 lb 3.2 oz)   SpO2 96%   BMI 23.95 kg/m  Patient discharged per MD orders. Discharge instructions reviewed with patient and patient verbalized understanding. IV removed per policy. Prescriptions discussed and given to patient. Discharged via wheelchair escorted by auxilary.  Almedia Balls, RN

## 2017-01-21 ENCOUNTER — Encounter (INDEPENDENT_AMBULATORY_CARE_PROVIDER_SITE_OTHER): Payer: Self-pay

## 2017-01-21 ENCOUNTER — Telehealth (INDEPENDENT_AMBULATORY_CARE_PROVIDER_SITE_OTHER): Payer: Self-pay

## 2017-01-26 ENCOUNTER — Telehealth (INDEPENDENT_AMBULATORY_CARE_PROVIDER_SITE_OTHER): Payer: Self-pay | Admitting: Vascular Surgery

## 2017-01-26 NOTE — Telephone Encounter (Signed)
Spoke with Dr. Lucky Cowboy and per him the patient needs to try taking Benadryl for her rash, her  Fever unless it gets to 101.5 is considered normal if it gets higher to go to the ED and as far as her cough she may need to see her PCP because she may be coming down with something. I did let her know that it was possible to have an allergic reaction to the dye so take the Benadryl if not better call us back.

## 2017-01-26 NOTE — Telephone Encounter (Signed)
Patient called and said that she has a rash on her back that is spreading ever since her procedure. She stated that she has run a low grade fever at night and has a cough. She is wanting to know if she may be having a reaction to dye.

## 2017-01-28 ENCOUNTER — Ambulatory Visit (INDEPENDENT_AMBULATORY_CARE_PROVIDER_SITE_OTHER): Payer: BLUE CROSS/BLUE SHIELD | Admitting: Physician Assistant

## 2017-01-28 ENCOUNTER — Encounter: Payer: Self-pay | Admitting: Physician Assistant

## 2017-01-28 VITALS — BP 140/80 | HR 96 | Temp 98.4°F | Resp 16 | Wt 140.2 lb

## 2017-01-28 DIAGNOSIS — R05 Cough: Secondary | ICD-10-CM | POA: Diagnosis not present

## 2017-01-28 DIAGNOSIS — R21 Rash and other nonspecific skin eruption: Secondary | ICD-10-CM | POA: Diagnosis not present

## 2017-01-28 DIAGNOSIS — R059 Cough, unspecified: Secondary | ICD-10-CM

## 2017-01-28 MED ORDER — PREDNISONE 10 MG (21) PO TBPK: ORAL_TABLET | ORAL | 0 refills | Status: DC

## 2017-01-28 NOTE — Patient Instructions (Signed)
Prednisone tablets °What is this medicine? °PREDNISONE (PRED ni sone) is a corticosteroid. It is commonly used to treat inflammation of the skin, joints, lungs, and other organs. Common conditions treated include asthma, allergies, and arthritis. It is also used for other conditions, such as blood disorders and diseases of the adrenal glands. °This medicine may be used for other purposes; ask your health care provider or pharmacist if you have questions. °COMMON BRAND NAME(S): Deltasone, Predone, Sterapred, Sterapred DS °What should I tell my health care provider before I take this medicine? °They need to know if you have any of these conditions: °-Cushing's syndrome °-diabetes °-glaucoma °-heart disease °-high blood pressure °-infection (especially a virus infection such as chickenpox, cold sores, or herpes) °-kidney disease °-liver disease °-mental illness °-myasthenia gravis °-osteoporosis °-seizures °-stomach or intestine problems °-thyroid disease °-an unusual or allergic reaction to lactose, prednisone, other medicines, foods, dyes, or preservatives °-pregnant or trying to get pregnant °-breast-feeding °How should I use this medicine? °Take this medicine by mouth with a glass of water. Follow the directions on the prescription label. Take this medicine with food. If you are taking this medicine once a day, take it in the morning. Do not take more medicine than you are told to take. Do not suddenly stop taking your medicine because you may develop a severe reaction. Your doctor will tell you how much medicine to take. If your doctor wants you to stop the medicine, the dose may be slowly lowered over time to avoid any side effects. °Talk to your pediatrician regarding the use of this medicine in children. Special care may be needed. °Overdosage: If you think you have taken too much of this medicine contact a poison control center or emergency room at once. °NOTE: This medicine is only for you. Do not share this  medicine with others. °What if I miss a dose? °If you miss a dose, take it as soon as you can. If it is almost time for your next dose, talk to your doctor or health care professional. You may need to miss a dose or take an extra dose. Do not take double or extra doses without advice. °What may interact with this medicine? °Do not take this medicine with any of the following medications: °-metyrapone °-mifepristone °This medicine may also interact with the following medications: °-aminoglutethimide °-amphotericin B °-aspirin and aspirin-like medicines °-barbiturates °-certain medicines for diabetes, like glipizide or glyburide °-cholestyramine °-cholinesterase inhibitors °-cyclosporine °-digoxin °-diuretics °-ephedrine °-female hormones, like estrogens and birth control pills °-isoniazid °-ketoconazole °-NSAIDS, medicines for pain and inflammation, like ibuprofen or naproxen °-phenytoin °-rifampin °-toxoids °-vaccines °-warfarin °This list may not describe all possible interactions. Give your health care provider a list of all the medicines, herbs, non-prescription drugs, or dietary supplements you use. Also tell them if you smoke, drink alcohol, or use illegal drugs. Some items may interact with your medicine. °What should I watch for while using this medicine? °Visit your doctor or health care professional for regular checks on your progress. If you are taking this medicine over a prolonged period, carry an identification card with your name and address, the type and dose of your medicine, and your doctor's name and address. °This medicine may increase your risk of getting an infection. Tell your doctor or health care professional if you are around anyone with measles or chickenpox, or if you develop sores or blisters that do not heal properly. °If you are going to have surgery, tell your doctor or health care professional that   you have taken this medicine within the last twelve months. °Ask your doctor or health  care professional about your diet. You may need to lower the amount of salt you eat. °This medicine may affect blood sugar levels. If you have diabetes, check with your doctor or health care professional before you change your diet or the dose of your diabetic medicine. °What side effects may I notice from receiving this medicine? °Side effects that you should report to your doctor or health care professional as soon as possible: °-allergic reactions like skin rash, itching or hives, swelling of the face, lips, or tongue °-changes in emotions or moods °-changes in vision °-depressed mood °-eye pain °-fever or chills, cough, sore throat, pain or difficulty passing urine °-increased thirst °-swelling of ankles, feet °Side effects that usually do not require medical attention (report to your doctor or health care professional if they continue or are bothersome): °-confusion, excitement, restlessness °-headache °-nausea, vomiting °-skin problems, acne, thin and shiny skin °-trouble sleeping °-weight gain °This list may not describe all possible side effects. Call your doctor for medical advice about side effects. You may report side effects to FDA at 1-800-FDA-1088. °Where should I keep my medicine? °Keep out of the reach of children. °Store at room temperature between 15 and 30 degrees C (59 and 86 degrees F). Protect from light. Keep container tightly closed. Throw away any unused medicine after the expiration date. °NOTE: This sheet is a summary. It may not cover all possible information. If you have questions about this medicine, talk to your doctor, pharmacist, or health care provider. °© 2017 [employer]vier/Gold Standard (2011-07-30 10:57:14) ° °

## 2017-01-28 NOTE — Progress Notes (Signed)
[name] Female    DOB: 25-Feb-1961   56 y.o.   MRN: [id number] Visit Date: 01/28/2017  Today's Provider: [name], PA-C   Chief Complaint  Patient presents with  . Cough  . Rash   Subjective:    HPI Cough: She reports she started her cough on the day of her surgery. She had an angiogram done on 01/18/17. She just didn't pay much attention. She reports is better but have it. She had a temperature of 101.5 on Thursday or Friday. No runny nose, postnasal drip, sneezing. She is taking Naproxen and Ibuprofen 600mg .  Rash: She feels like she had the rash while she was at the hospital because she was itching. She notice the rash Wednesday when she got home from the hospital and now is in her stomach. Is red like "pimble" and itchess. She does report that the rash started after the angiogram. She has had IV contrast before for imaging and never reacted before. She is unsure if rash is from contrast, something she may have been given in the hospital or from the bed linens.    Allergies  Allergen Reactions  . Bee Venom Anaphylaxis     Current Outpatient Prescriptions:  .  aspirin EC 81 MG EC tablet, Take 1 tablet (81 mg total) by mouth daily., Disp: 90 tablet, Rfl: 3 .  Calcium Carbonate-Vit D-Min (CALCIUM 1200) 1200-1000 MG-UNIT CHEW, Chew 1 each by mouth daily., Disp: , Rfl:  .  ibuprofen (ADVIL,MOTRIN) 600 MG tablet, Take 1 tablet (600 mg total) by mouth every 8 (eight) hours as needed., Disp: 90 tablet, Rfl: 1 .  Multiple Vitamins-Minerals (MULTIVITAMIN WITH MINERALS) tablet, Take 1 tablet by mouth daily., Disp: , Rfl:  .  Omega-3 Fatty Acids (FISH OIL) 1000 MG CAPS, Take 1 capsule by mouth daily., Disp: , Rfl:  .  vitamin B-12 (CYANOCOBALAMIN) 1000 MCG tablet, Take 1,000 mcg by mouth daily., Disp: , Rfl:  .  oxyCODONE-acetaminophen (PERCOCET/ROXICET) 5-325 MG tablet, Take 2 tablets by mouth every 6 (six) hours as needed for moderate pain. (Patient not  taking: Reported on 01/28/2017), Disp: 30 tablet, Rfl: 0  Review of Systems  Constitutional: Negative.   HENT: Negative for congestion, postnasal drip, rhinorrhea and sneezing.   Respiratory: Positive for cough. Negative for chest tightness, shortness of breath and wheezing.   Cardiovascular: Negative for chest pain, palpitations and leg swelling.  Gastrointestinal: Negative.   Skin: Positive for rash.  Neurological: Negative.     Social History  Substance Use Topics  . Smoking status: Former Research scientist (life sciences)  . Smokeless tobacco: Never Used     Comment: Smoked for about 5 years, smoked about 1 pack per day. Quit in 1984  . Alcohol use Yes     Comment: Occasional alcohol use; Drinks 2-4 glasses of wine per week.   Objective:   BP 140/80 (BP Location: Right Arm, Patient Position: Sitting, Cuff Size: Normal)   Pulse 96   Temp 98.4 F (36.9 C) (Oral)   Resp 16   Wt 140 lb 3.2 oz (63.6 kg)   SpO2 98%   BMI 24.84 kg/m   Physical Exam  Constitutional: She is oriented to person, place, and time. She appears well-developed and well-nourished. No distress.  Neck: Normal range of motion. Neck supple. No JVD present. No tracheal deviation present. No thyromegaly present.  Cardiovascular: Normal rate, regular rhythm and normal heart sounds.  Exam reveals no gallop and no friction rub.  No murmur heard. Pulmonary/Chest: Effort normal and breath sounds normal. No respiratory distress. She has no wheezes. She has no rales. She exhibits no tenderness.  Musculoskeletal: Normal range of motion. She exhibits no tenderness.  Lymphadenopathy:    She has no cervical adenopathy.  Neurological: She is alert and oriented to person, place, and time.  Skin: Rash noted. Rash is papular. She is not diaphoretic.     Psychiatric: She has a normal mood and affect. Her behavior is normal. Judgment and thought content normal.  Vitals reviewed.      Assessment & Plan:     1. Rash of back Most likely an  allergic dermatitis. 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 Will give 6 day prednisone taper as below. She is to call if symptoms worsen. - predniSONE (STERAPRED UNI-PAK 21 TAB) 10 MG (21) TBPK tablet; Take as directed on package instructions  Dispense: 21 tablet; Refill: 0  2. Cough Dry cough that has been improving. Will give prednisone for rash as above that may help the cough as well. She is to call if symptoms worsen. - predniSONE (STERAPRED UNI-PAK 21 TAB) 10 MG (21) TBPK tablet; Take as directed on package instructions  Dispense: 21 tablet; Refill: 0       [name], PA-C  [facility]

## 2017-02-05 ENCOUNTER — Telehealth: Payer: Self-pay | Admitting: Physician Assistant

## 2017-02-05 DIAGNOSIS — R059 Cough, unspecified: Secondary | ICD-10-CM

## 2017-02-05 DIAGNOSIS — R05 Cough: Secondary | ICD-10-CM

## 2017-02-05 MED ORDER — BENZONATATE 200 MG PO CAPS: 200.0000 mg | ORAL_CAPSULE | Freq: Three times a day (TID) | ORAL | 0 refills | Status: DC | PRN

## 2017-02-05 NOTE — Telephone Encounter (Signed)
Tessalon perles sent to [facility]

## 2017-02-05 NOTE — Telephone Encounter (Signed)
Pt states she seen 2 weeks ago for cough.  Pt states she has finished the prednisone and still has a cough.  Pt is asking can she take something over the counter or can she get a Rx to help with this.  [facility].  [phone]

## 2017-02-05 NOTE — Telephone Encounter (Signed)
Please review. Emily Drozdowski, CMA  

## 2017-02-05 NOTE — Telephone Encounter (Signed)
Pt advised. Natalie Dickerson, CMA  

## 2017-03-02 ENCOUNTER — Encounter (INDEPENDENT_AMBULATORY_CARE_PROVIDER_SITE_OTHER): Payer: BLUE CROSS/BLUE SHIELD | Admitting: Vascular Surgery

## 2017-03-02 ENCOUNTER — Encounter (INDEPENDENT_AMBULATORY_CARE_PROVIDER_SITE_OTHER): Payer: BLUE CROSS/BLUE SHIELD

## 2017-03-22 ENCOUNTER — Ambulatory Visit (INDEPENDENT_AMBULATORY_CARE_PROVIDER_SITE_OTHER): Payer: BLUE CROSS/BLUE SHIELD | Admitting: Physician Assistant

## 2017-03-22 ENCOUNTER — Encounter: Payer: Self-pay | Admitting: Physician Assistant

## 2017-03-22 VITALS — BP 190/106 | HR 82 | Temp 98.4°F | Resp 16 | Ht 63.0 in | Wt 140.0 lb

## 2017-03-22 DIAGNOSIS — I10 Essential (primary) hypertension: Secondary | ICD-10-CM | POA: Diagnosis not present

## 2017-03-22 DIAGNOSIS — R05 Cough: Secondary | ICD-10-CM | POA: Diagnosis not present

## 2017-03-22 DIAGNOSIS — I722 Aneurysm of renal artery: Secondary | ICD-10-CM

## 2017-03-22 DIAGNOSIS — R059 Cough, unspecified: Secondary | ICD-10-CM

## 2017-03-22 MED ORDER — AMLODIPINE BESYLATE 10 MG PO TABS: 10.0000 mg | ORAL_TABLET | Freq: Every day | ORAL | 1 refills | Status: DC

## 2017-03-22 MED ORDER — PREDNISONE 10 MG (21) PO TBPK: ORAL_TABLET | ORAL | 0 refills | Status: DC

## 2017-03-22 NOTE — Patient Instructions (Signed)

## 2017-03-22 NOTE — Progress Notes (Signed)
[name] Female    DOB: 1961-09-09   56 y.o.   MRN: [id number] Visit Date: 03/22/2017  Today's Provider: [name], PA-C   Chief Complaint  Patient presents with  . Hypertension   Subjective:    HPI Patient here today C/O elevated blood pressure at [facility] on 03/18/2017 BP was 188/106. Patient was told she has left eye retinal hemorrhage. Patient reports that she does have a history of head injury in 03/26/2016. Patient reports that she has been having headaches around temple area and eyes. She also recently underwent coiling and stenting of the right renal artery due to aneurysm on 01/18/17. She denies any chest pain, SOB, lower extremity edema, dizziness. She does report having pulsatile tinnitus when she lies on the right side.   Patient reports she is still having cough since 01/28/2017. Pt was treated with Prednisone. Patient reports cough is worse at night.  Patient also C/O changes to her heart beat since having neck and head injury in May, 2017.     Allergies  Allergen Reactions  . Bee Venom Anaphylaxis     Current Outpatient Prescriptions:  .  aspirin EC 81 MG EC tablet, Take 1 tablet (81 mg total) by mouth daily., Disp: 90 tablet, Rfl: 3 .  Calcium Carbonate-Vit D-Min (CALCIUM 1200) 1200-1000 MG-UNIT CHEW, Chew 1 each by mouth daily., Disp: , Rfl:  .  ibuprofen (ADVIL,MOTRIN) 600 MG tablet, Take 1 tablet (600 mg total) by mouth every 8 (eight) hours as needed., Disp: 90 tablet, Rfl: 1 .  Multiple Vitamins-Minerals (MULTIVITAMIN WITH MINERALS) tablet, Take 1 tablet by mouth daily., Disp: , Rfl:  .  Omega-3 Fatty Acids (FISH OIL) 1000 MG CAPS, Take 1 capsule by mouth daily., Disp: , Rfl:  .  vitamin B-12 (CYANOCOBALAMIN) 1000 MCG tablet, Take 1,000 mcg by mouth daily., Disp: , Rfl:   Review of Systems  Constitutional: Negative.   HENT: Positive for tinnitus (pulsatile only when lying on the right side).   Eyes: Positive for visual  disturbance.  Respiratory: Positive for cough. Negative for chest tightness, shortness of breath and wheezing.   Cardiovascular: Positive for palpitations (since the head injury in May; Had work-up in ED a couple of times and always normal). Negative for chest pain and leg swelling.  Gastrointestinal:       Having a pulling sensation on right side  Musculoskeletal: Positive for neck pain.  Neurological: Positive for numbness and headaches. Negative for dizziness, syncope, weakness and light-headedness.    Social History  Substance Use Topics  . Smoking status: Former Smoker    Years: 5.00  . Smokeless tobacco: Never Used     Comment: Smoked for about 5 years, smoked about 1 pack per day. Quit in 1984  . Alcohol use Yes     Comment: Occasional alcohol use; Drinks 2-4 glasses of wine per week.   Objective:   BP (!) 190/106 (BP Location: Left Arm, Patient Position: Sitting, Cuff Size: Large)   Pulse 82   Temp 98.4 F (36.9 C) (Oral)   Resp 16   Ht 5\' 3"  (1.6 m)   Wt 140 lb (63.5 kg)   SpO2 98%   BMI 24.80 kg/m  Vitals:   03/22/17 1549  BP: (!) 190/106  Pulse: 82  Resp: 16  Temp: 98.4 F (36.9 C)  TempSrc: Oral  SpO2: 98%  Weight: 140 lb (63.5 kg)  Height: 5\' 3"  (1.6 m)  Physical Exam  Constitutional: She appears well-developed and well-nourished. No distress.  Neck: Normal range of motion. Neck supple. No JVD present. No tracheal deviation present. No thyromegaly present.  Cardiovascular: Normal rate, regular rhythm and normal heart sounds.  Exam reveals no gallop and no friction rub.   No murmur heard. Pulmonary/Chest: Effort normal and breath sounds normal. No respiratory distress. She has no wheezes. She has no rales.  Musculoskeletal: She exhibits no edema.  Lymphadenopathy:    She has no cervical adenopathy.  Skin: She is not diaphoretic.  Psychiatric: She has a normal mood and affect. Her behavior is normal. Judgment and thought content normal.  Vitals  reviewed.     Assessment & Plan:     1. Hypertension, unspecified type Will give amlodipine as below. Advised of adverse reactions. I will see her back in one week. Message sent to Dr. Lucky Cowboy to advise him of patient's new onset hypertension since patient recently had coiling and stent x 2 in right renal artery on 01/18/17. She has not had follow up yet due to cancellation by provider due to not having a ultrasound technician and rescheduled to the end of April.  - amLODipine (NORVASC) 10 MG tablet; Take 1 tablet (10 mg total) by mouth daily.  Dispense: 30 tablet; Refill: 1  2. Renal artery aneurysm of native kidney [facility] See above medical treatment plan.  3. Cough Will give another prednisone taper for residual cough. Will recheck next week at follow up. - predniSONE (STERAPRED UNI-PAK 21 TAB) 10 MG (21) TBPK tablet; Take as directed on package instructions  Dispense: 21 tablet; Refill: 0       [name], PA-C  [facility]

## 2017-03-23 ENCOUNTER — Emergency Department: Payer: BLUE CROSS/BLUE SHIELD

## 2017-03-23 ENCOUNTER — Telehealth: Payer: Self-pay

## 2017-03-23 ENCOUNTER — Encounter (INDEPENDENT_AMBULATORY_CARE_PROVIDER_SITE_OTHER): Payer: BLUE CROSS/BLUE SHIELD

## 2017-03-23 ENCOUNTER — Inpatient Hospital Stay
Admission: EM | Admit: 2017-03-23 | Discharge: 2017-03-25 | DRG: 675 | Disposition: A | Discharge status: Home / Self Care | Payer: BLUE CROSS/BLUE SHIELD | Attending: Internal Medicine | Admitting: Internal Medicine

## 2017-03-23 ENCOUNTER — Encounter: Payer: Self-pay | Admitting: Emergency Medicine

## 2017-03-23 ENCOUNTER — Other Ambulatory Visit (INDEPENDENT_AMBULATORY_CARE_PROVIDER_SITE_OTHER): Payer: Self-pay | Admitting: Vascular Surgery

## 2017-03-23 DIAGNOSIS — Z85828 Personal history of other malignant neoplasm of skin: Secondary | ICD-10-CM

## 2017-03-23 DIAGNOSIS — R930 Abnormal findings on diagnostic imaging of skull and head, not elsewhere classified: Secondary | ICD-10-CM | POA: Diagnosis not present

## 2017-03-23 DIAGNOSIS — Z9889 Other specified postprocedural states: Secondary | ICD-10-CM

## 2017-03-23 DIAGNOSIS — Z8679 Personal history of other diseases of the circulatory system: Secondary | ICD-10-CM | POA: Diagnosis present

## 2017-03-23 DIAGNOSIS — Z803 Family history of malignant neoplasm of breast: Secondary | ICD-10-CM | POA: Diagnosis not present

## 2017-03-23 DIAGNOSIS — Z79899 Other long term (current) drug therapy: Secondary | ICD-10-CM

## 2017-03-23 DIAGNOSIS — I160 Hypertensive urgency: Secondary | ICD-10-CM

## 2017-03-23 DIAGNOSIS — Z8249 Family history of ischemic heart disease and other diseases of the circulatory system: Secondary | ICD-10-CM

## 2017-03-23 DIAGNOSIS — E876 Hypokalemia: Secondary | ICD-10-CM | POA: Diagnosis present

## 2017-03-23 DIAGNOSIS — I10 Essential (primary) hypertension: Secondary | ICD-10-CM

## 2017-03-23 DIAGNOSIS — Z87891 Personal history of nicotine dependence: Secondary | ICD-10-CM

## 2017-03-23 DIAGNOSIS — N280 Ischemia and infarction of kidney: Principal | ICD-10-CM | POA: Diagnosis present

## 2017-03-23 DIAGNOSIS — I701 Atherosclerosis of renal artery: Secondary | ICD-10-CM | POA: Diagnosis present

## 2017-03-23 DIAGNOSIS — I150 Renovascular hypertension: Secondary | ICD-10-CM | POA: Diagnosis present

## 2017-03-23 DIAGNOSIS — R51 Headache: Secondary | ICD-10-CM | POA: Diagnosis not present

## 2017-03-23 DIAGNOSIS — Z91041 Radiographic dye allergy status: Secondary | ICD-10-CM

## 2017-03-23 DIAGNOSIS — R55 Syncope and collapse: Secondary | ICD-10-CM | POA: Diagnosis not present

## 2017-03-23 DIAGNOSIS — Z91030 Bee allergy status: Secondary | ICD-10-CM

## 2017-03-23 DIAGNOSIS — I7090 Unspecified atherosclerosis: Secondary | ICD-10-CM | POA: Diagnosis not present

## 2017-03-23 DIAGNOSIS — I722 Aneurysm of renal artery: Secondary | ICD-10-CM

## 2017-03-23 LAB — CBC
HCT: 48 % — ABNORMAL HIGH (ref 35.0–47.0)
HCT: 42.1 % (ref 35.0–47.0)
Hemoglobin: 16.2 g/dL — ABNORMAL HIGH (ref 12.0–16.0)
Hemoglobin: 14.2 g/dL (ref 12.0–16.0)
MCH: 29.5 pg (ref 26.0–34.0)
MCH: 30 pg (ref 26.0–34.0)
MCHC: 33.7 g/dL (ref 32.0–36.0)
MCHC: 33.7 g/dL (ref 32.0–36.0)
MCV: 87.6 fL (ref 80.0–100.0)
MCV: 89.2 fL (ref 80.0–100.0)
Platelets: 343 10*3/uL (ref 150–440)
Platelets: 296 10*3/uL (ref 150–440)
RBC: 5.48 MIL/uL — ABNORMAL HIGH (ref 3.80–5.20)
RBC: 4.72 MIL/uL (ref 3.80–5.20)
RDW: 13.4 % (ref 11.5–14.5)
RDW: 13.4 % (ref 11.5–14.5)
WBC: 12.8 10*3/uL — ABNORMAL HIGH (ref 3.6–11.0)
WBC: 7.8 10*3/uL (ref 3.6–11.0)

## 2017-03-23 LAB — CSF CELL COUNT WITH DIFFERENTIAL
Eosinophils, CSF: 0 %
Lymphs, CSF: 0 %
Monocyte-Macrophage-Spinal Fluid: 0 %
Other Cells, CSF: 0
RBC Count, CSF: 0 /mm3 (ref 0–3)
RBC Count, CSF: 7 /mm3 — ABNORMAL HIGH (ref 0–3)
Segmented Neutrophils-CSF: 0 %
Tube #: 4
Tube #: 1
WBC, CSF: 0 /mm3 (ref 0–5)
WBC, CSF: 0 /mm3 (ref 0–5)

## 2017-03-23 LAB — URINALYSIS, COMPLETE (UACMP) WITH MICROSCOPIC
Bacteria, UA: NONE SEEN
Bilirubin Urine: NEGATIVE
Glucose, UA: 50 mg/dL — AB
Hgb urine dipstick: NEGATIVE
Ketones, ur: NEGATIVE mg/dL
Leukocytes, UA: NEGATIVE
Nitrite: NEGATIVE
Protein, ur: 100 mg/dL — AB
Specific Gravity, Urine: 1.016 (ref 1.005–1.030)
Squamous Epithelial / HPF: NONE SEEN
pH: 7 (ref 5.0–8.0)

## 2017-03-23 LAB — PROTEIN AND GLUCOSE, CSF
Glucose, CSF: 59 mg/dL (ref 40–70)
Total  Protein, CSF: 34 mg/dL (ref 15–45)

## 2017-03-23 LAB — BASIC METABOLIC PANEL
Anion gap: 10 (ref 5–15)
BUN: 18 mg/dL (ref 6–20)
CO2: 25 mmol/L (ref 22–32)
Calcium: 9.4 mg/dL (ref 8.9–10.3)
Chloride: 104 mmol/L (ref 101–111)
Creatinine, Ser: 0.92 mg/dL (ref 0.44–1.00)
GFR calc Af Amer: >60 mL/min (ref >60)
GFR calc non Af Amer: >60 mL/min (ref >60)
Glucose, Bld: 122 mg/dL — ABNORMAL HIGH (ref 65–99)
Potassium: 3.1 mmol/L — ABNORMAL LOW (ref 3.5–5.1)
Sodium: 139 mmol/L (ref 135–145)

## 2017-03-23 LAB — CREATININE, SERUM
Creatinine, Ser: 0.89 mg/dL (ref 0.44–1.00)
GFR calc Af Amer: >60 mL/min (ref >60)
GFR calc non Af Amer: >60 mL/min (ref >60)

## 2017-03-23 LAB — PROTIME-INR
INR: 0.9
Prothrombin Time: 12.1 seconds (ref 11.4–15.2)

## 2017-03-23 LAB — APTT: aPTT: 27 seconds (ref 24–36)

## 2017-03-23 MED ORDER — HYDROCODONE-ACETAMINOPHEN 5-325 MG PO TABS: 1.0000 | ORAL_TABLET | ORAL | Status: DC | PRN

## 2017-03-23 MED ORDER — LISINOPRIL 20 MG PO TABS
20.0000 mg | ORAL_TABLET | Freq: Every day | ORAL | Status: DC
  Administered 2017-03-23 – 2017-03-25 (×3): 20 mg via ORAL
  Filled 2017-03-23: qty 2
  Filled 2017-03-23: qty 1
  Filled 2017-03-23: qty 2

## 2017-03-23 MED ORDER — CALCIUM CARBONATE-VITAMIN D 500-200 MG-UNIT PO TABS
2.0000 | ORAL_TABLET | Freq: Every day | ORAL | Status: DC
  Filled 2017-03-23: qty 2

## 2017-03-23 MED ORDER — DIPHENHYDRAMINE HCL 50 MG/ML IJ SOLN
50.0000 mg | Freq: Once | INTRAMUSCULAR | Status: AC
  Administered 2017-03-23: 50 mg via INTRAVENOUS
  Filled 2017-03-23: qty 1

## 2017-03-23 MED ORDER — ONDANSETRON HCL 4 MG/2ML IJ SOLN: 4.0000 mg | Freq: Four times a day (QID) | INTRAMUSCULAR | Status: DC | PRN

## 2017-03-23 MED ORDER — LABETALOL HCL 100 MG PO TABS
100.0000 mg | ORAL_TABLET | Freq: Two times a day (BID) | ORAL | Status: DC
  Administered 2017-03-23 – 2017-03-25 (×4): 100 mg via ORAL
  Filled 2017-03-23 (×4): qty 1

## 2017-03-23 MED ORDER — ASPIRIN EC 81 MG PO TBEC
81.0000 mg | DELAYED_RELEASE_TABLET | Freq: Every day | ORAL | Status: DC
  Administered 2017-03-23 – 2017-03-25 (×3): 81 mg via ORAL
  Filled 2017-03-23 (×4): qty 1

## 2017-03-23 MED ORDER — LABETALOL HCL 5 MG/ML IV SOLN
20.0000 mg | Freq: Once | INTRAVENOUS | Status: AC
Start: 2017-03-23 — End: 2017-03-23
  Administered 2017-03-23: 20 mg via INTRAVENOUS
  Filled 2017-03-23: qty 4

## 2017-03-23 MED ORDER — HYDROMORPHONE HCL 1 MG/ML IJ SOLN
1.0000 mg | INTRAMUSCULAR | Status: AC
  Administered 2017-03-23: 1 mg via INTRAVENOUS
  Filled 2017-03-23: qty 1

## 2017-03-23 MED ORDER — BISACODYL 5 MG PO TBEC: 5.0000 mg | DELAYED_RELEASE_TABLET | Freq: Every day | ORAL | Status: DC | PRN

## 2017-03-23 MED ORDER — HYDRALAZINE HCL 25 MG PO TABS
25.0000 mg | ORAL_TABLET | Freq: Three times a day (TID) | ORAL | Status: DC
  Administered 2017-03-23 – 2017-03-25 (×3): 25 mg via ORAL
  Filled 2017-03-23: qty 2
  Filled 2017-03-23 (×4): qty 1

## 2017-03-23 MED ORDER — LABETALOL HCL 5 MG/ML IV SOLN
20.0000 mg | Freq: Once | INTRAVENOUS | Status: AC
  Administered 2017-03-23: 20 mg via INTRAVENOUS
  Filled 2017-03-23: qty 4

## 2017-03-23 MED ORDER — ACETAMINOPHEN 650 MG RE SUPP: 650.0000 mg | Freq: Four times a day (QID) | RECTAL | Status: DC | PRN

## 2017-03-23 MED ORDER — ADULT MULTIVITAMIN W/MINERALS CH
1.0000 | ORAL_TABLET | Freq: Every day | ORAL | Status: DC
  Filled 2017-03-23: qty 1

## 2017-03-23 MED ORDER — LIDOCAINE HCL (PF) 1 % IJ SOLN
5.0000 mL | Freq: Once | INTRAMUSCULAR | Status: AC
  Administered 2017-03-23: 5 mL

## 2017-03-23 MED ORDER — DOCUSATE SODIUM 100 MG PO CAPS
100.0000 mg | ORAL_CAPSULE | Freq: Two times a day (BID) | ORAL | Status: DC
  Administered 2017-03-24 – 2017-03-25 (×3): 100 mg via ORAL
  Filled 2017-03-23 (×3): qty 1

## 2017-03-23 MED ORDER — POTASSIUM CHLORIDE CRYS ER 20 MEQ PO TBCR
40.0000 meq | EXTENDED_RELEASE_TABLET | ORAL | Status: AC
  Administered 2017-03-23: 40 meq via ORAL
  Filled 2017-03-23: qty 2

## 2017-03-23 MED ORDER — IOPAMIDOL (ISOVUE-370) INJECTION 76%
100.0000 mL | Freq: Once | INTRAVENOUS | Status: AC | PRN
  Administered 2017-03-23: 100 mL via INTRAVENOUS

## 2017-03-23 MED ORDER — CLONIDINE HCL 0.1 MG PO TABS
0.1000 mg | ORAL_TABLET | Freq: Three times a day (TID) | ORAL | Status: DC
Start: 2017-03-23 — End: 2017-03-23
  Administered 2017-03-23: 0.1 mg via ORAL
  Filled 2017-03-23: qty 1

## 2017-03-23 MED ORDER — HEPARIN SODIUM (PORCINE) 5000 UNIT/ML IJ SOLN
5000.0000 [IU] | Freq: Three times a day (TID) | INTRAMUSCULAR | Status: DC
  Administered 2017-03-23 – 2017-03-25 (×4): 5000 [IU] via SUBCUTANEOUS
  Filled 2017-03-23 (×6): qty 1

## 2017-03-23 MED ORDER — ONDANSETRON HCL 4 MG PO TABS: 4.0000 mg | ORAL_TABLET | Freq: Four times a day (QID) | ORAL | Status: DC | PRN

## 2017-03-23 MED ORDER — AMLODIPINE BESYLATE 10 MG PO TABS
10.0000 mg | ORAL_TABLET | Freq: Every day | ORAL | Status: DC
  Administered 2017-03-24 – 2017-03-25 (×2): 10 mg via ORAL
  Filled 2017-03-23: qty 2
  Filled 2017-03-23: qty 1

## 2017-03-23 MED ORDER — SODIUM CHLORIDE 0.9 % IV BOLUS (SEPSIS)
2000.0000 mL | Freq: Once | INTRAVENOUS | Status: AC
  Administered 2017-03-23: 2000 mL via INTRAVENOUS

## 2017-03-23 MED ORDER — TRAZODONE HCL 50 MG PO TABS: 25.0000 mg | ORAL_TABLET | Freq: Every evening | ORAL | Status: DC | PRN

## 2017-03-23 MED ORDER — LIDOCAINE HCL (PF) 1 % IJ SOLN
INTRAMUSCULAR | Status: AC
  Filled 2017-03-23: qty 5

## 2017-03-23 MED ORDER — METHYLPREDNISOLONE SODIUM SUCC 125 MG IJ SOLR
125.0000 mg | Freq: Once | INTRAMUSCULAR | Status: AC
  Administered 2017-03-23: 125 mg via INTRAVENOUS
  Filled 2017-03-23: qty 2

## 2017-03-23 MED ORDER — ACETAMINOPHEN 325 MG PO TABS: 650.0000 mg | ORAL_TABLET | Freq: Four times a day (QID) | ORAL | Status: DC | PRN

## 2017-03-23 MED ORDER — ONDANSETRON HCL 4 MG/2ML IJ SOLN
4.0000 mg | Freq: Once | INTRAMUSCULAR | Status: AC
  Administered 2017-03-23: 4 mg via INTRAVENOUS
  Filled 2017-03-23: qty 2

## 2017-03-23 NOTE — Telephone Encounter (Signed)
Triage: Patient calling crying that she just passed out in the shower.No one was with her and she reports that she knows she went "down" but only hurt her elbow. She reports that she waited to get her senses back before she got up.  She reports that last night she had a severe headache and she took the new medication Amlodipine 10 mg at 5 pm and she went to bed later. She woke up this morning with the headache and went to take a shower and she started to feel weak and light headed and she passed out. She reports taking the Amlodipine 10 mg after the episode. She is having palpitations. Denies SOB and chest pain. She also reports that she has the [place] appointment with Dr.Drew at 9 am. Advised patient that per Tawanna Sat (Provider) to go to the ED. Patient was kind off hesitating to go but with patient history of renal aneurysm and the new onset of hypertension that she really need to go to the ED. Patient agreed and feels comfortable driving.  Thanks,  -Joseline

## 2017-03-23 NOTE — ED Provider Notes (Signed)
[facility] Emergency Department Provider Note  ____________________________________________  Time seen: Approximately 4:00 PM  I have reviewed the triage vital signs and the nursing notes.   HISTORY  Chief Complaint Near Syncope and Hypertension    HPI Natalie Dickerson is a 56 y.o. female who complains of bilateral frontal headache and high blood pressure for last 2-3 days. She saw her primary care yesterday who started her on Norvasc. However, she got in the shower this morning and subsequently had dizziness and passed out. Still complains of severe headache. Initial blood pressure about 210/110.  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 Denies any changes in her urination.  Patient reports an allergic reaction to dye that was given to her during angiography during her last hospitalization. I discussed this with Dr. due who feels she was not having an allergic reaction. It is a question that she was having some contact dermatitis from bed linens. Patient has had IV contrast studies in the past without difficulty.   Past Medical History:  Diagnosis Date  . Cancer [facility])    skin ca     Patient Active Problem List   Diagnosis Date Noted  . Renal artery aneurysm of native kidney ([place]) 12/29/2016  . Pulmonary granulomatosis 12/29/2016  . Avitaminosis D 10/25/2015  . B12 deficiency 10/25/2015  . History of chicken pox 10/22/2015  . Uterine fibroid 10/22/2015  . Basal cell carcinoma of face 09/27/2013  . Cervical pain 07/19/2008  . Irregular bleeding 07/19/2008  . Candidal vulvovaginitis 03/01/2008     Past Surgical History:  Procedure Laterality Date  . FINGER SURGERY    . PERIPHERAL VASCULAR CATHETERIZATION N/A 01/18/2017   Procedure: Renal Angiography;  Surgeon: Algernon Huxley, MD;  Location: [place] [facility];  Service: Cardiovascular;  Laterality: N/A;  . PERIPHERAL  VASCULAR CATHETERIZATION  01/18/2017   Procedure: Renal Intervention;  Surgeon: Algernon Huxley, MD;  Location: [place] [facility];  Service: Cardiovascular;;     Prior to Admission medications   Medication Sig Start Date End Date Taking? Authorizing Provider  amLODipine (NORVASC) 10 MG tablet Take 1 tablet (10 mg total) by mouth daily. 03/22/17  Yes [name], PA-C  aspirin EC 81 MG EC tablet Take 1 tablet (81 mg total) by mouth daily. 01/19/17  Yes Algernon Huxley, MD  Calcium Carbonate-Vit D-Min (CALCIUM 1200) 1200-1000 MG-UNIT CHEW Chew 1 each by mouth daily.   Yes Historical Provider, MD  ibuprofen (ADVIL,MOTRIN) 600 MG tablet Take 1 tablet (600 mg total) by mouth every 8 (eight) hours as needed. 12/29/16  Yes [name], PA-C  Multiple Vitamins-Minerals (MULTIVITAMIN WITH MINERALS) tablet Take 1 tablet by mouth daily.   Yes Historical Provider, MD  Omega-3 Fatty Acids (FISH OIL) 1000 MG CAPS Take 1 capsule by mouth daily.   Yes Historical Provider, MD  vitamin B-12 (CYANOCOBALAMIN) 1000 MCG tablet Take 1,000 mcg by mouth daily.   Yes Historical Provider, MD  predniSONE (STERAPRED UNI-PAK 21 TAB) 10 MG (21) TBPK tablet Take as directed on package instructions 03/22/17   [name], PA-C     Allergies Bee venom and Ivp dye [iodinated diagnostic agents]   Family History  Problem Relation Age of Onset  . Hypertension Mother   . Breast cancer Mother   . Cancer Mother   . Breast cancer Father   . Hypertension Sister   . Hypertension  Brother     Social History Social History  Substance Use Topics  . Smoking status: Former Smoker    Years: 5.00  . Smokeless tobacco: Never Used     Comment: Smoked for about 5 years, smoked about 1 pack per day. Quit in 1984  . Alcohol use Yes     Comment: Occasional alcohol use; Drinks 2-4 glasses of wine per week.    Review of Systems  Constitutional:   No fever or chills.  ENT:   No sore throat. No  rhinorrhea. Cardiovascular:   No chest pain. Respiratory:   No dyspnea or cough. Gastrointestinal:   Positive right flank pain. No vomiting or diarrhea. Genitourinary:   Negative for dysuria or difficulty urinating. Musculoskeletal:   Negative for focal pain or swelling Neurological:   As above for headaches 10-point ROS otherwise negative.  ____________________________________________   PHYSICAL EXAM:  VITAL SIGNS: ED Triage Vitals  Enc Vitals Group     BP 03/23/17 0920 (!) 206/108     Pulse Rate 03/23/17 0920 (!) 101     Resp 03/23/17 0920 18     Temp 03/23/17 0920 98.9 F (37.2 C)     Temp Source 03/23/17 0920 Oral     SpO2 03/23/17 0920 99 %     Weight 03/23/17 0912 140 lb (63.5 kg)     Height 03/23/17 0920 5\' 3"  (1.6 m)     Head Circumference --      Peak Flow --      Pain Score 03/23/17 0911 8     Pain Loc --      Pain Edu? --      Excl. in [place]? --     Vital signs reviewed, nursing assessments reviewed.   Constitutional:   Alert and oriented. Uncomfortable but not in distress. Eyes:   No scleral icterus. No conjunctival pallor. PERRL. EOMI.  No nystagmus. ENT   Head:   Normocephalic and atraumatic.   Nose:   No congestion/rhinnorhea. No septal hematoma   Mouth/Throat:   MMM, no pharyngeal erythema. No peritonsillar mass.    Neck:   No stridor. No SubQ emphysema. No meningismus. Hematological/Lymphatic/Immunilogical:   No cervical lymphadenopathy. Cardiovascular:   RRR. Symmetric bilateral radial and DP pulses.  No murmurs.  Respiratory:   Normal respiratory effort without tachypnea nor retractions. Breath sounds are clear and equal bilaterally. No wheezes/rales/rhonchi. Gastrointestinal:   Soft and nontender. Non distended. There is no CVA tenderness.  No rebound, rigidity, or guarding. Genitourinary:   deferred Musculoskeletal:   Normal range of motion in all extremities. No joint effusions.  No lower extremity tenderness.  No edema. Neurologic:    Normal speech and language.  CN 2-10 normal. Motor grossly intact. No gross focal neurologic deficits are appreciated.  Skin:    Skin is warm, dry and intact. No rash noted.  No petechiae, purpura, or bullae.  ____________________________________________    LABS (pertinent positives/negatives) (all labs ordered are listed, but only abnormal results are displayed) Labs Reviewed  BASIC METABOLIC PANEL - Abnormal; Notable for the following:       Result Value   Potassium 3.1 (*)    Glucose, Bld 122 (*)    All other components within normal limits  CBC - Abnormal; Notable for the following:    WBC 12.8 (*)    RBC 5.48 (*)    Hemoglobin 16.2 (*)    HCT 48.0 (*)    All other components within normal limits  URINALYSIS, COMPLETE (UACMP)  WITH MICROSCOPIC - Abnormal; Notable for the following:    Color, Urine YELLOW (*)    APPearance CLOUDY (*)    Glucose, UA 50 (*)    Protein, ur 100 (*)    All other components within normal limits  CSF CELL COUNT WITH DIFFERENTIAL - Abnormal; Notable for the following:    Color, CSF CLEAR (*)    Appearance, CSF COLORLESS (*)    RBC Count, CSF 7 (*)    All other components within normal limits  CSF CELL COUNT WITH DIFFERENTIAL - Abnormal; Notable for the following:    Appearance, CSF CLEAR (*)    All other components within normal limits  CSF CULTURE  GRAM STAIN  PROTIME-INR  APTT  PROTEIN AND GLUCOSE, CSF  CBG MONITORING, ED   ____________________________________________   EKG  Interpreted by me Sinus tachycardia rate 104, normal axis intervals QRS ST segments and T waves.  ____________________________________________    RADIOLOGY  Ct Head Wo Contrast  Result Date: 03/23/2017 CLINICAL DATA:  Possible syncopal episode this morning EXAM: CT HEAD WITHOUT CONTRAST TECHNIQUE: Contiguous axial images were obtained from the base of the skull through the vertex without intravenous contrast. COMPARISON:  None. FINDINGS: Brain: No evidence  of acute infarction, hemorrhage, hydrocephalus, extra-axial collection or mass lesion/mass effect. Vascular: No hyperdense vessel or unexpected calcification. Skull: Normal. Negative for fracture or focal lesion. Sinuses/Orbits: No acute finding. Other: None. IMPRESSION: No acute abnormality noted. Electronically Signed   By: Inez Catalina M.D.   On: 03/23/2017 10:33    ____________________________________________   PROCEDURES Procedures CRITICAL CARE Performed by: Joni Fears, Bryn Perkin   Total critical care time: 35 minutes  Critical care time was exclusive of separately billable procedures and treating other patients.  Critical care was necessary to treat or prevent imminent or life-threatening deterioration.  Critical care was time spent personally by me on the following activities: development of treatment plan with patient and/or surrogate as well as nursing, discussions with consultants, evaluation of patient's response to treatment, examination of patient, obtaining history from patient or surrogate, ordering and performing treatments and interventions, ordering and review of laboratory studies, ordering and review of radiographic studies, pulse oximetry and re-evaluation of patient's condition.  LUMBAR PUNCTURE  Date/Time: 03/23/2017 at 4:03 PM Performed by: Carrie Mew  Consent: Verbal consent obtained. Written consent obtained. Risks and benefits: risks, benefits and alternatives were discussed Consent given by: Patient Patient understanding: patient states understanding of the procedure being performed  Patient consent: the patient's understanding of the procedure matches consent given  Procedure consent: procedure consent matches procedure scheduled  Relevant documents: relevant documents present and verified  Test results: test results available and properly labeled Site marked: the operative site was marked Imaging studies: imaging studies available  Required items:  required blood products, implants, devices, and special equipment available  Patient identity confirmed: verbally with patient and arm band  Time out: Immediately prior to procedure a "time out" was called to verify the correct patient, procedure, equipment, support staff and site/side marked as required.  Indications: Rule out subarachnoid hemorrhage  Anesthesia: local infiltration Local anesthetic: lidocaine 1% without epinephrine Anesthetic total: 5 ml Patient sedated: No  Analgesia: Dilaudid  Preparation: Patient was prepped and draped in the usual sterile fashion. Lumbar space: L3-L4 interspace Patient's position: left lateral decubitus Needle gauge: 20 Needle length: 3.5 in Number of attempts: 1 Opening pressure: 27 cm H2O Fluid appearance: Clear Tubes of fluid: 4 Total volume: 10 ml Post-procedure: site cleaned and adhesive  bandage applied Patient tolerance: Patient tolerated the procedure well with no immediate complications   ____________________________________________   INITIAL IMPRESSION / ASSESSMENT AND PLAN / ED COURSE  Pertinent labs & imaging results that were available during my care of the patient were reviewed by me and considered in my medical decision making (see chart for details).  Patient not in distress but presents with severe hypertension with concerning symptoms of headache and syncope. Low suspicion for meningitis encephalitis. Concern for subarachnoid. CT head obtained which was negative. Then proceeded with lumbar puncture which was also negative for bleed.     Clinical Course as of Mar 24 1599  Tue Mar 23, 2017  1019 Iv labetalol for htn, tachy, severe headache.   [PS]  1251 Headache resolved after dilaudid. Will proceed with workup. D/w Dr. Lucky Cowboy, who recs. CT angio of abdomen if possible.  Discussing possible contrast allergy sx with pt suggests she had a limited cutaneous reaction that is not consistent with a hypersensitivity reaction.   [PS]   1330 LP complete. Proceed with CTA  [PS]  1429 D/w rads Dr. Carlota Raspberry, confirms 4 hour pre-med protocol. Okay to use equivalent dosing of solumedrol in place of hydrocortisone. Verified with pharmacy that 125 solumedrol is sufficient; equivalent dose of solumedrol for 200mg  hydrocortisone is only 40mg  solumedrol.   [PS]    Clinical Course User Index [PS] Carrie Mew, MD    ----------------------------------------- 4:05 PM on 03/23/2017 -----------------------------------------  Patient awaiting CT angiogram 5 PM. Patient signed out to Dr. Alfred Levins to follow up on results. If blood pressure continues to be significantly elevated or symptoms recur, patient may require hospitalization for blood pressure management. If she remains asymptomatic and blood pressure under control, I think patient would be suitable for discharge home on 3 times a day clonidine to follow up with primary care closely.   ____________________________________________   FINAL CLINICAL IMPRESSION(S) / ED DIAGNOSES  Final diagnoses:  Hypertensive urgency  Syncope, unspecified syncope type      New Prescriptions   No medications on file     Portions of this note were generated with dragon dictation software. Dictation errors may occur despite best attempts at proofreading.    Carrie Mew, MD 03/23/17 [phone]

## 2017-03-23 NOTE — ED Notes (Signed)
BP 153/92.  Patient given water.  Verbalizes no complaints.  LP site without drainage.  Lights turned down.

## 2017-03-23 NOTE — ED Notes (Signed)
Resumed care from valerie rn.  Pt alert,  nsr on monitor.  Pt continues to have a headache.  Family with pt. Speech clear.  Iv in place.

## 2017-03-23 NOTE — ED Notes (Signed)
Nurse unable to take report  

## 2017-03-23 NOTE — ED Notes (Signed)
Pt up to bathroom.  Pt alert.  Family with pt.

## 2017-03-23 NOTE — H&P (Signed)
[facility] [place] NAME: Natalie Dickerson    MR#:  [id number]  DATE OF BIRTH:  11/10/1961  DATE OF ADMISSION:  03/23/2017  PRIMARY CARE PHYSICIAN: [name], PA-C   REQUESTING/REFERRING PHYSICIAN: Dr.Varonese  CHIEF COMPLAINT: Headache    Chief Complaint  Patient presents with  . Near Syncope  . Hypertension    HISTORY OF PRESENT ILLNESS:  Natalie Dickerson  is a 56 y.o. female with a known history of Recent surgery for right renal artery aneurysm with coiling, stent by Dr. Lucky Cowboy on Jan 22nd came because of severe headache. Patient went to primary doctor yesterday because of headache and found to have elevated blood pressure and started on Norvasc. Patient continued to have a headache last night, and  she passed out in the bathroom this morning. She came with severe headache with blood pressure of 190/106 when she came. She had a CT head which is unremarkable so she had LP done the emergency room, and LP also  is unremarkable. Patient  Had clonidine 0.1 mg, Labetalol 40 mg IV in the emergency room. Patient blood pressures improved briefly but it is going up again to 172/117. Spoke with nephrology, urologist. Patient has stent stenosis, right renal artery infarct. This her headache resolved. No abdominal pain. No flank pain.  PAST MEDICAL HISTORY:   Past Medical History:  Diagnosis Date  . Cancer ([place])    skin ca    PAST SURGICAL HISTOIRY:   Past Surgical History:  Procedure Laterality Date  . FINGER SURGERY    . PERIPHERAL VASCULAR CATHETERIZATION N/A 01/18/2017   Procedure: Renal Angiography;  Surgeon: Algernon Huxley, MD;  Location: [facility];  Service: Cardiovascular;  Laterality: N/A;  . PERIPHERAL VASCULAR CATHETERIZATION  01/18/2017   Procedure: Renal Intervention;  Surgeon: Algernon Huxley, MD;  Location: [facility];  Service: Cardiovascular;;    SOCIAL HISTORY:   Social History  Substance Use Topics  .  Smoking status: Former Smoker    Years: 5.00  . Smokeless tobacco: Never Used     Comment: Smoked for about 5 years, smoked about 1 pack per day. Quit in 1984  . Alcohol use Yes     Comment: Occasional alcohol use; Drinks 2-4 glasses of wine per week.    FAMILY HISTORY:   Family History  Problem Relation Age of Onset  . Hypertension Mother   . Breast cancer Mother   . Cancer Mother   . Breast cancer Father   . Hypertension Sister   . Hypertension Brother     DRUG ALLERGIES:   Allergies  Allergen Reactions  . Bee Venom Anaphylaxis  . Ivp Dye [Iodinated Diagnostic Agents] Rash    REVIEW OF SYSTEMS:  CONSTITUTIONAL: No fever, fatigue or weakness.  EYES: No blurred or double vision.  EARS, NOSE, AND THROAT: No tinnitus or ear pain.  RESPIRATORY: No cough, shortness of breath, wheezing or hemoptysis.  CARDIOVASCULAR: No chest pain, orthopnea, edema.  GASTROINTESTINAL: No nausea, vomiting, diarrhea or abdominal pain.  GENITOURINARY: No dysuria, hematuria.  ENDOCRINE: No polyuria, nocturia,  HEMATOLOGY: No anemia, easy bruising or bleeding SKIN: No rash or lesion. MUSCULOSKELETAL: No joint pain or arthritis.   NEUROLOGIC: No tingling, numbness, weakness.  PSYCHIATRY: No anxiety or depression.   MEDICATIONS AT HOME:   Prior to Admission medications   Medication Sig Start Date End Date Taking? Authorizing Provider  amLODipine (NORVASC) 10 MG tablet Take 1 tablet (10  mg total) by mouth daily. 03/22/17  Yes [name], PA-C  aspirin EC 81 MG EC tablet Take 1 tablet (81 mg total) by mouth daily. 01/19/17  Yes Algernon Huxley, MD  Calcium Carbonate-Vit D-Min (CALCIUM 1200) 1200-1000 MG-UNIT CHEW Chew 1 each by mouth daily.   Yes Historical Provider, MD  ibuprofen (ADVIL,MOTRIN) 600 MG tablet Take 1 tablet (600 mg total) by mouth every 8 (eight) hours as needed. 12/29/16  Yes [name], PA-C  Multiple Vitamins-Minerals (MULTIVITAMIN WITH MINERALS) tablet Take 1 tablet  by mouth daily.   Yes Historical Provider, MD  Omega-3 Fatty Acids (FISH OIL) 1000 MG CAPS Take 1 capsule by mouth daily.   Yes Historical Provider, MD  vitamin B-12 (CYANOCOBALAMIN) 1000 MCG tablet Take 1,000 mcg by mouth daily.   Yes Historical Provider, MD  predniSONE (STERAPRED UNI-PAK 21 TAB) 10 MG (21) TBPK tablet Take as directed on package instructions 03/22/17   [name], PA-C      VITAL SIGNS:  Blood pressure (!) 172/117, pulse 96, temperature 98.9 F (37.2 C), temperature source Oral, resp. rate (!) 24, height 5\' 3"  (1.6 m), weight 63.5 kg (140 lb), SpO2 99 %.  PHYSICAL EXAMINATION:  GENERAL:  56 y.o.-year-old patient lying in the bed with no acute distress.  EYES: Pupils equal, round, reactive to light and accommodation. No scleral icterus. Extraocular muscles intact.  HEENT: Head atraumatic, normocephalic. Oropharynx and nasopharynx clear.  NECK:  Supple, no jugular venous distention. No thyroid enlargement, no tenderness.  LUNGS: Normal breath sounds bilaterally, no wheezing, rales,rhonchi or crepitation. No use of accessory muscles of respiration.  CARDIOVASCULAR: S1, S2 normal. No murmurs, rubs, or gallops.  ABDOMEN: Soft, nontender, nondistended. Bowel sounds present. No organomegaly or mass.  EXTREMITIES: No pedal edema, cyanosis, or clubbing.  NEUROLOGIC: Cranial nerves II through XII are intact. Muscle strength 5/5 in all extremities. Sensation intact. Gait not checked.  PSYCHIATRIC: The patient is alert and oriented x 3.  SKIN: No obvious rash, lesion, or ulcer.   LABORATORY PANEL:   CBC  Recent Labs Lab 03/23/17 0916  WBC 12.8*  HGB 16.2*  HCT 48.0*  PLT 343   ------------------------------------------------------------------------------------------------------------------  Chemistries   Recent Labs Lab 03/23/17 0916  NA 139  K 3.1*  CL 104  CO2 25  GLUCOSE 122*  BUN 18  CREATININE 0.92  CALCIUM 9.4    ------------------------------------------------------------------------------------------------------------------  Cardiac Enzymes No results for input(s): TROPONINI in the last 168 hours. ------------------------------------------------------------------------------------------------------------------  RADIOLOGY:  Ct Head Wo Contrast  Result Date: 03/23/2017 CLINICAL DATA:  Possible syncopal episode this morning EXAM: CT HEAD WITHOUT CONTRAST TECHNIQUE: Contiguous axial images were obtained from the base of the skull through the vertex without intravenous contrast. COMPARISON:  None. FINDINGS: Brain: No evidence of acute infarction, hemorrhage, hydrocephalus, extra-axial collection or mass lesion/mass effect. Vascular: No hyperdense vessel or unexpected calcification. Skull: Normal. Negative for fracture or focal lesion. Sinuses/Orbits: No acute finding. Other: None. IMPRESSION: No acute abnormality noted. Electronically Signed   By: Inez Catalina M.D.   On: 03/23/2017 10:33   Ct Angio Abd/pel W And/or Wo Contrast  Result Date: 03/23/2017 CLINICAL DATA:  bilateral frontal headache and high blood pressure for last 2-3 days. She saw her primary care yesterday who started her on Norvasc. However, she got in the shower this morning and subsequently had dizziness and passed out. Still complains of severe headache. Initial blood pressure about 210/110. Patient previously had coiling of a right renal artery aneurysm as  well as stenting of the right renal artery on 01/18/2017. Denies any changes in her urination. EXAM: CTA ABDOMEN AND PELVIS WITH CONTRAST TECHNIQUE: Multidetector CT imaging of the abdomen and pelvis was performed using the standard protocol during bolus administration of intravenous contrast. Multiplanar reconstructed images and MIPs were obtained and reviewed to evaluate the vascular anatomy. CONTRAST:  100 mL Isovue 370 IV COMPARISON:  None. FINDINGS: VASCULAR Aorta: Mild tortuosity. No  aneurysm, dissection, or stenosis. No atheromatous irregularity. Celiac: Patent without evidence of aneurysm, dissection, vasculitis or significant stenosis. SMA: Patent without evidence of aneurysm, dissection, vasculitis or significant stenosis. Renals: Left renal artery is duplicated with separate anterior and posterior branches, both widely patent. Duplicated right renal artery. The superior is dominant and occludes shortly beyond its origin. There is a stent extending through the distal aspect of the superior right renal artery to the hilum, occluded throughout its length. Embolization coil mass is adjacent to the distal aspect of the stent. The inferior right renal artery is diminutive and remains patent, supplying only a portion of the lower pole. IMA: Patent without evidence of aneurysm, dissection, vasculitis or significant stenosis. Inflow: Patent without evidence of aneurysm, dissection, vasculitis or significant stenosis. Proximal Outflow: Bilateral common femoral and visualized portions of the superficial and profunda femoral arteries are patent without evidence of aneurysm, dissection, vasculitis or significant stenosis. Veins: Renal veins patent. Retroaortic left renal vein, an anatomic variant. Patent hepatic veins, portal vein, SMV, splenic vein, IVC, iliac venous system. Review of the MIP images confirms the above findings. NON-VASCULAR Lower chest: Calcified granulomas in both lung bases. No pleural or pericardial effusion. Hepatobiliary: No focal liver abnormality is seen. No gallstones, gallbladder wall thickening, or biliary dilatation. Pancreas: Unremarkable. No pancreatic ductal dilatation or surrounding inflammatory changes. Spleen: Normal in size without focal abnormality. Adrenals/Urinary Tract: There is infarction of most of the right kidney related to the occluded dominant superior right renal artery. Patchy areas of perfused parenchyma in the upper pole on delayed scans, which may be a  source of renovascular hypertension. Left kidney unremarkable. Adrenal glands negative. Urinary bladder unremarkable. Stomach/Bowel: Stomach is within normal limits. Appendix appears normal. No evidence of bowel wall thickening, distention, or inflammatory changes. Lymphatic: No significant vascular findings are present. No enlarged abdominal or pelvic lymph nodes. Reproductive: Uterus and bilateral adnexa are unremarkable. Other: No ascites.  No free air. Musculoskeletal: No acute or significant osseous findings. IMPRESSION: VASCULAR 1. Occlusion of the dominant superior right renal artery, which was previously stented, with infarction of much of the right renal parenchyma. 2. Patchy areas of partially perfused parenchyma in the upper pole right kidney presumably from collateral circulation from the diminutive inferior right renal artery. These may represent a source of renovascular hypertension. NON-VASCULAR 1. No acute findings Electronically Signed   By: Lucrezia Europe M.D.   On: 03/23/2017 17:56    EKG:   Orders placed or performed during the hospital encounter of 03/23/17  . ED EKG  . ED EKG    IMPRESSION AND PLAN:   56 year old female patient with severe headache, syncope secondary to the extensive emergency. Patient had CT of head Unremarkable LP done in the emergency room because of headache, LP is unremarkable. Patient received labetalol 40 MG IV, clonidine. Blood pressure is slightly improved but going up again. 1,Malignant hypertension: With recent right renal artery aneurysm repair, stent placed in right renal artery. Now has stent stenosis, right renal artery infarct. CT and abdomen showed loaded dominant superior right renal  artery with infarct of the right kidney. Continues inhibitors, beta blockers, monitor on telemetry, if the blood pressure less than 120/80, use IV hydralazine 10 mg every 6 hours is for SBP more than 160/80. called the urology on call Dr. Louis Meckel discussed the  case with him more they will see the patient tomorrow and evaluate to see if patient needs nephrectomy. A vascular surgery consulted by ER physician Dr. dew is going to see the patient today evening. 2;Hypokalemia: Replace the potassium/  .lall the records are reviewed and case discussed with ED provider. Management plans discussed with the patient, family and they are in agreement.  CODE STATUS: full  TOTAL TIME TAKING CARE OF THIS PATIENT: 55 minutes.    Epifanio Lesches M.D on 03/23/2017 at 7:11 PM  Between 7am to 6pm - Pager - [phone]  After 6pm go to [email or web address] - password [email or web address] [facility]  Office  [phone]  CC: Primary care physician; [name], PA-C  Note: This dictation was prepared with Dragon dictation along with smaller phrase technology. Any transcriptional errors that result from this process are unintentional.

## 2017-03-23 NOTE — ED Notes (Signed)
ED Provider at bedside. 

## 2017-03-23 NOTE — Telephone Encounter (Signed)
Agreed and Dr. Lucky Cowboy notified via message.

## 2017-03-23 NOTE — Consult Note (Signed)
I have been asked to see the patient by Dr. Myles Rosenthal, for evaluation and management of right renal infarction with hypertensive emergency.  History of present illness: 56 year old female presented to the emergency room today after having a syncopal episode in the shower. The  patient was noted to have a blood pressure upon presentation of 210/110. She is complaining of dizziness as well as a severe headache. A CT scan in the emergency department demonstrated a large upper and mid pole right renal infarct.  The patient has a history of right renal artery aneurysm times 3 at the end of January for incidental finding of a 2 cm renal artery aneurysm. At the time of the percutaneous angiogram she was noted to have 2 additional aneurysms, each of these were coiled. This then required a stent placed in the renal artery. The patient was discharged home on aspirin, and does admit to not taking her aspirin every day, she often forgot to take it.  The patient states that proximally one week after her initial renal artery coiling and stenting procedure she presented to the neurologist who noted a slight increase in her blood pressure. However she was otherwise relatively asymptomatic. Several weeks later she was seen by the ophthalmologist and noted to have retinal hemorrhage presumed plate from her elevated blood pressure. The ophthalmologist recommended that she follow-up with her primary care doctor who saw the patient 2 days prior to her presentation to the emergency department. Her blood pressure there was 190/110 for which the patient was initiated on oral blood pressure medication, Norvasc. She subsequently developed severe headache, dizziness, and ultimately syncope while in the shower this morning.   She denies any severe flank pain. She denies any gross hematuria. She denies any significant fever or chills. She does describe 1 episode of acute right-sided flank pain over the weekend, and some  tightness in the right flank region, but no notable pain.  Currently, the patient's blood pressure is been better controlled and most recently was 160/90.  Review of the patient was found to have systems: A 12 point comprehensive review of systems was obtained and is negative unless otherwise stated in the history of present illness.  Patient Active Problem List   Diagnosis Date Noted  . Malignant HTN with heart disease, w/o CHF, w/o chronic kidney disease 03/23/2017  . Renal artery aneurysm of native kidney ([place]) 12/29/2016  . Pulmonary granulomatosis 12/29/2016  . Avitaminosis D 10/25/2015  . B12 deficiency 10/25/2015  . History of chicken pox 10/22/2015  . Uterine fibroid 10/22/2015  . Basal cell carcinoma of face 09/27/2013  . Cervical pain 07/19/2008  . Irregular bleeding 07/19/2008  . Candidal vulvovaginitis 03/01/2008    No current facility-administered medications on file prior to encounter.    Current Outpatient Prescriptions on File Prior to Encounter  Medication Sig Dispense Refill  . amLODipine (NORVASC) 10 MG tablet Take 1 tablet (10 mg total) by mouth daily. 30 tablet 1  . aspirin EC 81 MG EC tablet Take 1 tablet (81 mg total) by mouth daily. 90 tablet 3  . Calcium Carbonate-Vit D-Min (CALCIUM 1200) 1200-1000 MG-UNIT CHEW Chew 1 each by mouth daily.    Marland Kitchen ibuprofen (ADVIL,MOTRIN) 600 MG tablet Take 1 tablet (600 mg total) by mouth every 8 (eight) hours as needed. 90 tablet 1  . Multiple Vitamins-Minerals (MULTIVITAMIN WITH MINERALS) tablet Take 1 tablet by mouth daily.    . Omega-3 Fatty Acids (FISH OIL) 1000 MG CAPS Take 1 capsule by mouth  daily.    . vitamin B-12 (CYANOCOBALAMIN) 1000 MCG tablet Take 1,000 mcg by mouth daily.    . predniSONE (STERAPRED UNI-PAK 21 TAB) 10 MG (21) TBPK tablet Take as directed on package instructions 21 tablet 0    Past Medical History:  Diagnosis Date  . Cancer ([place])    skin ca    Past Surgical History:  Procedure Laterality  Date  . FINGER SURGERY    . PERIPHERAL VASCULAR CATHETERIZATION N/A 01/18/2017   Procedure: Renal Angiography;  Surgeon: Algernon Huxley, MD;  Location: [facility];  Service: Cardiovascular;  Laterality: N/A;  . PERIPHERAL VASCULAR CATHETERIZATION  01/18/2017   Procedure: Renal Intervention;  Surgeon: Algernon Huxley, MD;  Location: [facility];  Service: Cardiovascular;;    Social History  Substance Use Topics  . Smoking status: Former Smoker    Years: 5.00  . Smokeless tobacco: Never Used     Comment: Smoked for about 5 years, smoked about 1 pack per day. Quit in 1984  . Alcohol use Yes     Comment: Occasional alcohol use; Drinks 2-4 glasses of wine per week.    Family History  Problem Relation Age of Onset  . Hypertension Mother   . Breast cancer Mother   . Cancer Mother   . Breast cancer Father   . Hypertension Sister   . Hypertension Brother     PE: Vitals:   03/23/17 1930 03/23/17 2000 03/23/17 2030 03/23/17 2130  BP: (!) 162/85 (!) 163/86 (!) 157/87 (!) 164/81  Pulse: 86 88 83 88  Resp: 13 18 14 16   Temp:    98.4 F (36.9 C)  TempSrc:    Oral  SpO2: 100% 99% 99% 96%  Weight:    61.4 kg (135 lb 6.4 oz)  Height:       Patient appears to be in no acute distress  patient is alert and oriented x3 Atraumatic normocephalic head No cervical or supraclavicular lymphadenopathy appreciated No increased work of breathing, no audible wheezes/rhonchi Regular sinus rhythm/rate Abdomen is soft, nontender, nondistended, no CVA or suprapubic tenderness Lower extremities are symmetric without appreciable edema Grossly neurologically intact No identifiable skin lesions   Recent Labs  03/23/17 0916 03/23/17 1909  WBC 12.8* 7.8  HGB 16.2* 14.2  HCT 48.0* 42.1    Recent Labs  03/23/17 0916 03/23/17 1909  NA 139  --   K 3.1*  --   CL 104  --   CO2 25  --   GLUCOSE 122*  --   BUN 18  --   CREATININE 0.92 0.89  CALCIUM 9.4  --     Recent Labs   03/23/17 1032  INR 0.90   No results for input(s): LABURIN in the last 72 hours. Results for orders placed or performed during the hospital encounter of 03/23/17  CSF culture     Status: None (Preliminary result)   Collection Time: 03/23/17 12:22 PM  Result Value Ref Range Status   Specimen Description CSF  Final   Special Requests NONE  Final   Gram Stain CYTOSPIN SLIDE RARE WBC SEEN NO ORGANISMS SEEN   Final   Culture PENDING  Incomplete   Report Status PENDING  Incomplete    Imaging: I've individually reviewed the patient's CT angiogram demonstrating a largely right infarcted kidney with the notable stents within the radial artery. The patient also has a early branch of her renal artery which is patent and is supplying the posterior aspect  of the lower pole.  Impression: The patient likely has developed severe renovascular hypertension secondary to thrombosed renal artery appears to be near complete occlusion with a small early branch of this renal artery perfusing a small amount of the posterior lower pole. It is hard to know exactly when she thrombosed her stent, but it appears that she has been having progressively worsening hypertension over the past month or so.  Recommendations:  I discussed with the patient the clinical situation as well as her management options. She is to be evaluated in the AM by her  primary vascular surgeon and potentially will proceed with angiogram and possible probable lysis/thrombectomy. IF this is unsuccessful and the patient continues to have poorly controlled hypertension our options would then be either to infarct the remaining arterial blood supply to the kidney or to perform a laparoscopic simple nephrectomy.  We will make the patient nothing by mouth past midnight. We will follow along and discuss with vascular and/or interventional radiology as to which treatment option makes the most sense for this patient.   Thank you for involving me in this  patient's care, we will continue to follow along.   Marland KitchenLouis Meckel W

## 2017-03-23 NOTE — ED Provider Notes (Signed)
----------------------------------------- 4:20 PM on 03/23/2017 -----------------------------------------   Blood pressure (!) 149/85, pulse 77, temperature 98.9 F (37.2 C), temperature source Oral, resp. rate 16, height 5\' 3"  (1.6 m), weight 140 lb (63.5 kg), SpO2 97 %.  Assuming care from Dr. Joni Fears of [facility] Dickerson is a 56 y.o. female with a chief complaint of Near Syncope and Hypertension .    Patient with malignant hypertension. Concern for possible renal artery thrombosis due to recent coiling of renal artery aneurysm by Dr. Lucky Cowboy. Patient with allergic reaction to contrast, therefore undergoing prep with benadryl and steroids to under CTA per Dr. Rockne Coons. CT scheduled for 5PM.  _________________________ 6:24 PM on 03/23/2017 -----------------------------------------  CT Angio Abd/Pel W and/or Wo Contrast (Final result)  Result time 03/23/17 17:56:22  Final result by Arne Cleveland, MD (03/23/17 17:56:22)           Narrative:   CLINICAL DATA: bilateral frontal headache and high blood pressure for last 2-3 days. She saw her primary care yesterday who started her on Norvasc. However, she got in the shower this morning and subsequently had dizziness and passed out. Still complains of severe headache. Initial blood pressure about 210/110. 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 Denies any changes in her urination.  EXAM: CTA ABDOMEN AND PELVIS WITH CONTRAST  TECHNIQUE: Multidetector CT imaging of the abdomen and pelvis was performed using the standard protocol during bolus administration of intravenous contrast. Multiplanar reconstructed images and MIPs were obtained and reviewed to evaluate the vascular anatomy.  CONTRAST: 100 mL Isovue 370 IV  COMPARISON: None.  FINDINGS: VASCULAR  Aorta: Mild tortuosity. No aneurysm, dissection, or stenosis. No atheromatous  irregularity.  Celiac: Patent without evidence of aneurysm, dissection, vasculitis or significant stenosis.  SMA: Patent without evidence of aneurysm, dissection, vasculitis or significant stenosis.  Renals: Left renal artery is duplicated with separate anterior and posterior branches, both widely patent.  Duplicated right renal artery. The superior is dominant and occludes shortly beyond its origin. There is a stent extending through the distal aspect of the superior right renal artery to the hilum, occluded throughout its length. Embolization coil mass is adjacent to the distal aspect of the stent.  The inferior right renal artery is diminutive and remains patent, supplying only a portion of the lower pole.  IMA: Patent without evidence of aneurysm, dissection, vasculitis or significant stenosis.  Inflow: Patent without evidence of aneurysm, dissection, vasculitis or significant stenosis.  Proximal Outflow: Bilateral common femoral and visualized portions of the superficial and profunda femoral arteries are patent without evidence of aneurysm, dissection, vasculitis or significant stenosis.  Veins: Renal veins patent. Retroaortic left renal vein, an anatomic variant. Patent hepatic veins, portal vein, SMV, splenic vein, IVC, iliac venous system.  Review of the MIP images confirms the above findings.  NON-VASCULAR  Lower chest: Calcified granulomas in both lung bases. No pleural or pericardial effusion.  Hepatobiliary: No focal liver abnormality is seen. No gallstones, gallbladder wall thickening, or biliary dilatation.  Pancreas: Unremarkable. No pancreatic ductal dilatation or surrounding inflammatory changes.  Spleen: Normal in size without focal abnormality.  Adrenals/Urinary Tract: There is infarction of most of the right kidney related to the occluded dominant superior right renal artery. Patchy areas of perfused parenchyma in the upper pole on  delayed scans, which may be a source of renovascular hypertension. Left kidney unremarkable. Adrenal glands negative. Urinary bladder unremarkable.  Stomach/Bowel: Stomach is  within normal limits. Appendix appears normal. No evidence of bowel wall thickening, distention, or inflammatory changes.  Lymphatic: No significant vascular findings are present. No enlarged abdominal or pelvic lymph nodes.  Reproductive: Uterus and bilateral adnexa are unremarkable.  Other: No ascites. No free air.  Musculoskeletal: No acute or significant osseous findings.  IMPRESSION: VASCULAR  1. Occlusion of the dominant superior right renal artery, which was previously stented, with infarction of much of the right renal parenchyma. 2. Patchy areas of partially perfused parenchyma in the upper pole right kidney presumably from collateral circulation from the diminutive inferior right renal artery. These may represent a source of renovascular hypertension.  NON-VASCULAR  1. No acute findings          Discussed findings with Dr. Lucky Cowboy who recommended admission to Hospitalist for BP management. Dr. Lucky Cowboy will evaluate patient in house for possible angiogram. Patient in agreement with admission. No indication for heparin at this time. Will discuss with Hospitalist.   Rudene Re, MD 03/23/17 [phone]

## 2017-03-23 NOTE — ED Notes (Signed)
Pt remains to c/o headache and now nausea. Dr Joni Fears notified.

## 2017-03-23 NOTE — ED Triage Notes (Signed)
Pt states possible syncopal episode while in shower this am. Just seen yesterday at PCP and started on new bp meds for high blood pressure. Pt c/o headache and small abrasion to right arm. Pt denies hitting head in the shower. NAD.

## 2017-03-23 NOTE — ED Triage Notes (Signed)
Pt states she is also being evaluated for kidney issues, states she has an aneurysm on her right kidney and recently had an angiogram with Dr. Lucky Cowboy, denies hitting her head

## 2017-03-23 NOTE — ED Notes (Signed)
Patient transported to CT 

## 2017-03-23 NOTE — ED Notes (Signed)
MD at bedside for LP.

## 2017-03-23 NOTE — ED Notes (Signed)
Pt reports was at eye doctor 2 days ago and she noticed splinter hemorrhages and sent pt to pcp because often r/t HTN. Started on amlodipine at pcp. Took this AM. Pressure elevated today despite medication. Severe headache present with blurry vision. Blurry vision has been since aneurysm repair.

## 2017-03-23 NOTE — ED Notes (Signed)
CSF samples walked to lab

## 2017-03-23 NOTE — ED Notes (Signed)
meds given.  Family with pt.  Pt waiting on ct scan .  Pt alert.  Speech clear   nsr on monitor.

## 2017-03-23 NOTE — ED Notes (Signed)
Report called to alyss rn floor nurse.

## 2017-03-24 ENCOUNTER — Encounter: Payer: Self-pay | Admitting: Anesthesiology

## 2017-03-24 ENCOUNTER — Encounter
Admission: EM | Disposition: A | Discharge status: Home / Self Care | Payer: Self-pay | Source: Home / Self Care | Attending: Internal Medicine

## 2017-03-24 DIAGNOSIS — I701 Atherosclerosis of renal artery: Secondary | ICD-10-CM

## 2017-03-24 DIAGNOSIS — I150 Renovascular hypertension: Secondary | ICD-10-CM

## 2017-03-24 DIAGNOSIS — N280 Ischemia and infarction of kidney: Secondary | ICD-10-CM

## 2017-03-24 DIAGNOSIS — R55 Syncope and collapse: Secondary | ICD-10-CM

## 2017-03-24 DIAGNOSIS — I160 Hypertensive urgency: Secondary | ICD-10-CM

## 2017-03-24 HISTORY — PX: EMBOLIZATION (CATH LAB): CATH118239

## 2017-03-24 HISTORY — PX: RENAL ANGIOGRAPHY: CATH118260

## 2017-03-24 LAB — CBC
HCT: 39.6 % (ref 35.0–47.0)
Hemoglobin: 13.2 g/dL (ref 12.0–16.0)
MCH: 30.1 pg (ref 26.0–34.0)
MCHC: 33.4 g/dL (ref 32.0–36.0)
MCV: 90.2 fL (ref 80.0–100.0)
Platelets: 265 10*3/uL (ref 150–440)
RBC: 4.39 MIL/uL (ref 3.80–5.20)
RDW: 13.3 % (ref 11.5–14.5)
WBC: 13 10*3/uL — ABNORMAL HIGH (ref 3.6–11.0)

## 2017-03-24 LAB — BASIC METABOLIC PANEL
Anion gap: 7 (ref 5–15)
BUN: 24 mg/dL — ABNORMAL HIGH (ref 6–20)
CO2: 23 mmol/L (ref 22–32)
Calcium: 8.9 mg/dL (ref 8.9–10.3)
Chloride: 110 mmol/L (ref 101–111)
Creatinine, Ser: 1.01 mg/dL — ABNORMAL HIGH (ref 0.44–1.00)
GFR calc Af Amer: >60 mL/min (ref >60)
GFR calc non Af Amer: >60 mL/min (ref >60)
Glucose, Bld: 125 mg/dL — ABNORMAL HIGH (ref 65–99)
Potassium: 4 mmol/L (ref 3.5–5.1)
Sodium: 140 mmol/L (ref 135–145)

## 2017-03-24 LAB — GLUCOSE, CAPILLARY: Glucose-Capillary: 105 mg/dL — ABNORMAL HIGH (ref 65–99)

## 2017-03-24 SURGERY — RENAL ANGIOGRAPHY
Anesthesia: Moderate Sedation

## 2017-03-24 SURGERY — RENAL ANGIOGRAPHY
Anesthesia: Moderate Sedation | Laterality: Right

## 2017-03-24 MED ORDER — HEPARIN SODIUM (PORCINE) 1000 UNIT/ML IJ SOLN
INTRAMUSCULAR | Status: DC | PRN
  Administered 2017-03-24 (×2): 1000 [IU] via INTRAVENOUS
  Administered 2017-03-24: 2500 [IU] via INTRAVENOUS

## 2017-03-24 MED ORDER — MIDAZOLAM HCL 2 MG/2ML IJ SOLN
INTRAMUSCULAR | Status: DC | PRN
  Administered 2017-03-24: 1 mg via INTRAVENOUS

## 2017-03-24 MED ORDER — FENTANYL CITRATE (PF) 100 MCG/2ML IJ SOLN
INTRAMUSCULAR | Status: DC | PRN
  Administered 2017-03-24: 25 ug via INTRAVENOUS

## 2017-03-24 MED ORDER — FENTANYL CITRATE (PF) 100 MCG/2ML IJ SOLN
INTRAMUSCULAR | Status: AC
  Filled 2017-03-24: qty 2

## 2017-03-24 MED ORDER — MIDAZOLAM HCL 5 MG/5ML IJ SOLN
INTRAMUSCULAR | Status: AC
  Filled 2017-03-24: qty 5

## 2017-03-24 MED ORDER — HEPARIN SODIUM (PORCINE) 1000 UNIT/ML IJ SOLN
INTRAMUSCULAR | Status: AC
  Filled 2017-03-24: qty 1

## 2017-03-24 MED ORDER — CEFAZOLIN IN D5W 1 GM/50ML IV SOLN
1.0000 g | Freq: Once | INTRAVENOUS | Status: AC
  Administered 2017-03-24: 1 g via INTRAVENOUS
  Filled 2017-03-24: qty 50

## 2017-03-24 MED ORDER — IOPAMIDOL (ISOVUE-300) INJECTION 61%
INTRAVENOUS | Status: DC | PRN
  Administered 2017-03-24: 90 mL via INTRA_ARTERIAL

## 2017-03-24 MED ORDER — METHYLPREDNISOLONE SODIUM SUCC 125 MG IJ SOLR
125.0000 mg | Freq: Once | INTRAMUSCULAR | Status: AC
  Administered 2017-03-24: 125 mg via INTRAVENOUS

## 2017-03-24 MED ORDER — SODIUM CHLORIDE 0.9 % IV SOLN: INTRAVENOUS | Status: DC

## 2017-03-24 MED ORDER — METHYLPREDNISOLONE SODIUM SUCC 125 MG IJ SOLR
INTRAMUSCULAR | Status: AC
  Administered 2017-03-24: 125 mg via INTRAVENOUS
  Filled 2017-03-24: qty 2

## 2017-03-24 MED ORDER — DIPHENHYDRAMINE HCL 50 MG/ML IJ SOLN
INTRAMUSCULAR | Status: AC
  Filled 2017-03-24: qty 1

## 2017-03-24 MED ORDER — LIDOCAINE-EPINEPHRINE (PF) 2 %-1:200000 IJ SOLN
INTRAMUSCULAR | Status: AC
  Filled 2017-03-24: qty 20

## 2017-03-24 MED ORDER — DIPHENHYDRAMINE HCL 50 MG/ML IJ SOLN
50.0000 mg | Freq: Once | INTRAMUSCULAR | Status: AC
  Administered 2017-03-24: 50 mg via INTRAVENOUS

## 2017-03-24 MED ORDER — HEPARIN (PORCINE) IN NACL 2-0.9 UNIT/ML-% IJ SOLN
INTRAMUSCULAR | Status: AC
  Filled 2017-03-24: qty 1000

## 2017-03-24 SURGICAL SUPPLY — 26 items
CATH 5FR PIG 90CM (CATHETERS) ×3 IMPLANT
CATH 5FR REUT (CATHETERS) ×6 IMPLANT
CATH C2 65CM (CATHETERS) ×3 IMPLANT
CATH LANTERN 025 150CM 90TIP (MICROCATHETER) ×2 IMPLANT
CATH MHK2 5FR 80CM (CATHETERS) ×3 IMPLANT
CATH MICROCATH PRGRT 2.8F 130 (MICROCATHETER) ×1 IMPLANT
CATH RIM 65CM (CATHETERS) ×3 IMPLANT
COIL 400 COMPLEX SOFT 2X2CM (Vascular Products) ×3 IMPLANT
COIL 400 COMPLEX SOFT 2X4CM (Vascular Products) ×3 IMPLANT
COIL 400 COMPLEX SOFT 3X15CM (Vascular Products) ×3 IMPLANT
COIL 400 COMPLEX STD 3X5CM (Vascular Products) ×3 IMPLANT
DEVICE OCCLUSION PODJ5 (Embolic) ×1 IMPLANT
DEVICE PRESTO INFLATION (MISCELLANEOUS) ×3 IMPLANT
DEVICE STARCLOSE SE CLOSURE (Vascular Products) ×3 IMPLANT
DEVICE TORQUE (MISCELLANEOUS) ×3 IMPLANT
GUIDEWIRE ANGLED .035 180CM (WIRE) ×3 IMPLANT
GUIDEWIRE PFTE-COATED .018X300 (WIRE) ×3 IMPLANT
HANDLE DETACHMENT COIL (MISCELLANEOUS) ×3 IMPLANT
MICROCATH PROGREAT 2.8F 130CM (MICROCATHETER) ×3
OCCLUSION DEVICE PODJ5 (Embolic) ×3 IMPLANT
PACK ANGIOGRAPHY (CUSTOM PROCEDURE TRAY) ×3 IMPLANT
SHEATH BRITE TIP 5FRX11 (SHEATH) ×3 IMPLANT
SYR MEDRAD MARK V 150ML (SYRINGE) ×3 IMPLANT
TUBING CONTRAST HIGH PRESS 72 (TUBING) ×3 IMPLANT
WIRE G 018X200 V18 (WIRE) ×3 IMPLANT
WIRE J 3MM .035X145CM (WIRE) ×3 IMPLANT

## 2017-03-24 NOTE — Op Note (Signed)
[facility] Percutaneous Study/Intervention Procedural Note    Surgeon(s): [employer]  Assistants: None  Pre-operative Diagnosis: Right renal artery aneurysms, thrombosis of previous right renal artery stents with small amount of residual flow to the right kidney through polar branches and resultant renovascular hypertension  Post-operative diagnosis: Same  Procedure(s) Performed: 1. Ultrasound guidance for vascular access right femoral artery 2. Catheter placement into small inferior pole renal artery branch as well as a polar branch from the adrenal branch of the main right renal artery from right femoral approach 3. Aortogram and selective right renal angiogram 4. Coil embolization of the small inferior pole branch from the adrenal branch of the main renal artery with two 2 mm Ruby coils  5.  Coil embolization of the separate inferior polar renal artery branch with a single 2 mm Ruby coil and 2 additional 3 mm Ruby coils 6. StarClose closure device right femoral artery  Contrast: 90 cc  EBL: 25 cc   Fluoro Time: 52 minutes  Moderate conscious sedation: Approximately 120 minutes with 1 mg of Versed and 25 mcg of Fentanyl  Indications: The patient is a 56 year old female with worsening severe hypertension despite medications and occlusion of her previous renal artery stents that were placed for aneurysmal disease. There is still about 10% of the right kidney that is getting perfusion from small branches inferiorly, and she seems to have renovascular hypertension driven by the residual perfusion to this kidney. Given the clinical scenario and the noninvasive findings, angiogram is indicated for further evaluation of her renal artery and potential treatment. Risks and benefits are discussed and informed consent is obtained.  Procedure: The patient was identified and appropriate  procedural time out was performed. The patient was then placed supine on the table and prepped and draped in the usual sterile fashion.Moderate conscious sedation was administered with a face to face encounter with the patient throughout the procedure with my supervision of the RN administering medicines and monitoring the patients vital signs and mental status throughout from the start of the procedure until the patient was taken to the recovery room  Ultrasound was used to evaluate the right common femoral artery. It was patent . A digital ultrasound image was acquired. A Seldinger needle was used to access the right common femoral artery under direct ultrasound guidance and a permanent image was performed. A 0.035 J wire was advanced without resistance and a 5Fr sheath was placed. Pigtail catheter was placed into the aorta at the L1 level and an AP aortogram was performed. This demonstrated the main right renal artery is occluded just proximal to the previously placed stents. An adrenal branch which gives off a polar lower renal artery branch. This corresponds with the area of residual flow to the right kidney. There is a separate very small renal artery with an origin very near the main renal artery which also supplies the inferior pole of the right kidney which is the area with residual flow. The two left renal arteries appear patent with good flow.  No aorta or iliac artery stenosis.  Origin of the SMA at the level of the main right renal artery. The patient was then systemically heparinized with 4000 units of intravenous heparin. I initially was able to cannulate the main renal artery with a C2 and a VS 1 catheter and could not get out the adrenal branch with these orientations. I eventually exchanged for a rim catheter and a 0.018 advantage wire was able to  get out the adrenal branch and then down the inferior polar branch to the ureter and inferior pole of the kidney off of the adrenal artery. This  represented a tertiary renal artery catheterization, and I was able to get a microcatheter out into this branch. I then deployed two 2 mm Ruby coils and completely occluded this artery. I then turned my attention to the separate inferior polar renal artery branch. Containing catheter access to this branch was extremely difficult due to its small origin which was located essentially at the crotch of the right renal artery and the superior mesenteric artery. Multiple different catheters and wires and multiple different angles with magnified imaging were required. Prolonged fluoroscopy was necessary to gain access to this artery. I tried a VS 1 catheter, a rim catheter, a C2 catheter, several different microcatheters and several wires including a Glidewire, underwent 8 advantage wire, and the pro-great wire. We finally were able to get into the origin using a LAO cranial projection and a magnified image with a VS 2 catheter. With the VS 2 catheter in the origin I was able to advance a 0.018 advantage wire and the lantern microcatheter down this inferior polar branch. We confirmed our location almost in the renal parenchyma with a microcatheter. I then began coil embolization of this inferior polar right renal artery. I started with a 2 mm Ruby coil distally. I then worked my way back with a 3 mm diameter by 5 cm length Ruby coil and a 3 mm diameter by 15 cm length Ruby coil. The microcatheter was removed and angiogram with the VS 2 catheter in the artery showed successful coil embolization of this polar branch. With the VS 2 catheter put back into the aorta imaging was performed which showed no further flow in the renal artery that was identifiable. Oblique arteriogram was performed of the right femoral artery and StarClose closure device was deployed in the usual fashion with excellent hemostatic result. The patient was taken to the recovery room in stable condition having tolerated the procedure well.  Findings:   Aortogram/Renal Arteries:Main right renal artery is occluded just proximal to the previously placed stents. An adrenal branch which gives off a polar lower renal artery branch. This corresponds with the area of residual flow to the right kidney. There is a separate very small renal artery with an origin very near the main renal artery which also supplies the inferior pole of the right kidney which is the area with residual flow. The two left renal arteries appear patent with good flow.  No aorta or iliac artery stenosis.  Origin of the SMA at the level of the main right renal artery.   Condition:  Stable  Complications: None   Leotis Pain 03/24/2017 4:50 PM  This note was created with Dragon Medical transcription system. Any errors in dictation are purely unintentional.

## 2017-03-24 NOTE — Consult Note (Signed)
[facility] Vascular Consult Note  MRN : [id number]  Natalie Dickerson is a 56 y.o. (07-07-1961) female who presents with chief complaint of  Chief Complaint  Patient presents with  . Near Syncope  . Hypertension  .  History of Present Illness: I am asked to see the patient by Dr. Vianne Bulls for occlusion of her right renal artery stents. 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 For several days now, she has been having severe hypertension and has had a syncopal episode as well as the retinal hemorrhages and headaches from blood pressure of over 774 systolic. She is felt very poorly for a few days. She was scheduled to come to my office yesterday for renal artery duplex, but had a syncopal episode in the shower and presented to the emergency room. As part of her workup she had a CT angiogram which I have independently reviewed. This demonstrates occlusion/thrombosis of her right renal artery stents with a small polar right renal artery providing blood flow to about 10% of the kidney. Her left renal artery remains patent without significant stenosis or aneurysmal disease. She has 2 left renal arteries. She remains hypertensive, but has been improved since she has been hospitalized in getting medications. She is not really having much pain. She has no fever or chills.  Current Facility-Administered Medications  Medication Dose Route Frequency Provider Last Rate Last Dose  . methylPREDNISolone sodium succinate (SOLU-MEDROL) 125 mg/2 mL injection           . acetaminophen (TYLENOL) tablet 650 mg  650 mg Oral Q6H PRN Epifanio Lesches, MD       Or  . acetaminophen (TYLENOL) suppository 650 mg  650 mg Rectal Q6H PRN Epifanio Lesches, MD      . amLODipine (NORVASC) tablet 10 mg  10 mg Oral Daily  Epifanio Lesches, MD   10 mg at 03/24/17 1032  . aspirin EC tablet 81 mg  81 mg Oral Daily Epifanio Lesches, MD   81 mg at 03/24/17 1032  . bisacodyl (DULCOLAX) EC tablet 5 mg  5 mg Oral Daily PRN Epifanio Lesches, MD      . calcium-vitamin D (OSCAL WITH D) 500-200 MG-UNIT per tablet 2 tablet  2 tablet Oral Daily Epifanio Lesches, MD      . ceFAZolin (ANCEF) IVPB 1 g/50 mL premix  1 g Intravenous Once Algernon Huxley, MD      . docusate sodium (COLACE) capsule 100 mg  100 mg Oral BID Epifanio Lesches, MD   100 mg at 03/24/17 1031  . heparin injection 5,000 Units  5,000 Units Subcutaneous Q8H Epifanio Lesches, MD   5,000 Units at 03/24/17 1287  . hydrALAZINE (APRESOLINE) tablet 25 mg  25 mg Oral Q8H Vaughan Basta, MD   Stopped at 03/24/17 0546  . HYDROcodone-acetaminophen (NORCO/VICODIN) 5-325 MG per tablet 1-2 tablet  1-2 tablet Oral Q4H PRN Epifanio Lesches, MD      . labetalol (NORMODYNE) tablet 100 mg  100 mg Oral BID Vaughan Basta, MD   100 mg at 03/24/17 1032  . lisinopril (PRINIVIL,ZESTRIL) tablet 20 mg  20 mg Oral Daily Epifanio Lesches, MD   20 mg at 03/24/17 1032  . multivitamin with minerals tablet 1 tablet  1 tablet Oral Daily Epifanio Lesches, MD      . ondansetron [facility])  tablet 4 mg  4 mg Oral Q6H PRN Epifanio Lesches, MD       Or  . ondansetron (ZOFRAN) injection 4 mg  4 mg Intravenous Q6H PRN Epifanio Lesches, MD      . traZODone (DESYREL) tablet 25 mg  25 mg Oral QHS PRN Epifanio Lesches, MD        Past Medical History:  Diagnosis Date  . Cancer ([place])    skin ca    Past Surgical History:  Procedure Laterality Date  . FINGER SURGERY    . PERIPHERAL VASCULAR CATHETERIZATION N/A 01/18/2017   Procedure: Renal Angiography;  Surgeon: Algernon Huxley, MD;  Location: [place] [facility];  Service: Cardiovascular;  Laterality: N/A;  . PERIPHERAL VASCULAR CATHETERIZATION  01/18/2017   Procedure: Renal Intervention;  Surgeon: Algernon Huxley, MD;  Location: [place] [facility];  Service: Cardiovascular;;    Social History Social History  Substance Use Topics  . Smoking status: Former Smoker    Years: 5.00  . Smokeless tobacco: Never Used     Comment: Smoked for about 5 years, smoked about 1 pack per day. Quit in 1984  . Alcohol use Yes     Comment: Occasional alcohol use; Drinks 2-4 glasses of wine per week.  married  Family History Family History  Problem Relation Age of Onset  . Hypertension Mother   . Breast cancer Mother   . Cancer Mother   . Breast cancer Father   . Hypertension Sister   . Hypertension Brother     Allergies  Allergen Reactions  . Bee Venom Anaphylaxis  . Ivp Dye [Iodinated Diagnostic Agents] Rash     REVIEW OF SYSTEMS (Negative unless checked)  Constitutional: [] Weight loss  [] Fever  [] Chills Cardiac: [] Chest pain   [] Chest pressure   [] Palpitations   [] Shortness of breath when laying flat   [] Shortness of breath at rest   [] Shortness of breath with exertion. Vascular:  [] Pain in legs with walking   [] Pain in legs at rest   [] Pain in legs when laying flat   [] Claudication   [] Pain in feet when walking  [] Pain in feet at rest  [] Pain in feet when laying flat   [] History of DVT   [] Phlebitis   [] Swelling in legs   [] Varicose veins   [] Non-healing ulcers Pulmonary:   [] Uses home oxygen   [] Productive cough   [] Hemoptysis   [] Wheeze  [] COPD   [] Asthma Neurologic:  [] Dizziness  [x] Blackouts   [] Seizures   [] History of stroke   [] History of TIA  [] Aphasia   [] Temporary blindness   [] Dysphagia   [] Weakness or numbness in arms   [] Weakness or numbness in legs Musculoskeletal:  [] Arthritis   [] Joint swelling   [] Joint pain   [] Low back pain Hematologic:  [] Easy bruising  [] Easy bleeding   [] Hypercoagulable state   [] Anemic  [] Hepatitis Gastrointestinal:  [] Blood in stool   [] Vomiting blood  [] Gastroesophageal reflux/heartburn   [] Difficulty swallowing. Genitourinary:  [] Chronic kidney disease    [] Difficult urination  [] Frequent urination  [] Burning with urination   [] Blood in urine Skin:  [] Rashes   [] Ulcers   [] Wounds Psychological:  [] History of anxiety   []  History of major depression.  Physical Examination  Vitals:   03/23/17 2349 03/24/17 0529 03/24/17 0800 03/24/17 1256  BP: 131/81 114/65 125/66 139/85  Pulse: 89 71 64 76  Resp:  16 16 12   Temp:  98.2 F (36.8 C) 98 F (36.7 C) 98.2 F (36.8 C)  TempSrc:  Oral Oral Oral  SpO2: 96% 97% 99% 97%  Weight:  61.4 kg (135 lb 6.3 oz)    Height:       Body mass index is 23.98 kg/m. Gen:  WD/WN, NAD Head: [place]/AT, No temporalis wasting. Ear/Nose/Throat: Hearing grossly intact, nares w/o erythema or drainage, oropharynx w/o Erythema/Exudate Eyes: Sclera non-icteric, conjunctiva clear Neck: Trachea midline.  No JVD.  Pulmonary:  Good air movement, respirations not labored, equal bilaterally.  Cardiac: RRR, normal S1, S2. Vascular:  Vessel Right Left  Radial Palpable Palpable                                   Gastrointestinal: soft, non-tender/non-distended.  Musculoskeletal:  Extremities without ischemic changes.  No deformity or atrophy. No edema. Neurologic: Sensation grossly intact in extremities.  Symmetrical.  Speech is fluent. Psychiatric: Judgment intact, Mood & affect appropriate for pt's clinical situation. Dermatologic: No rashes or ulcers noted.  No cellulitis or open wounds.      CBC Lab Results  Component Value Date   WBC 13.0 (H) 03/24/2017   HGB 13.2 03/24/2017   HCT 39.6 03/24/2017   MCV 90.2 03/24/2017   PLT 265 03/24/2017    BMET    Component Value Date/Time   NA 140 03/24/2017 0429   NA 143 10/29/2016 0826   K 4.0 03/24/2017 0429   CL 110 03/24/2017 0429   CO2 23 03/24/2017 0429   GLUCOSE 125 (H) 03/24/2017 0429   BUN 24 (H) 03/24/2017 0429   BUN 13 10/29/2016 0826   CREATININE 1.01 (H) 03/24/2017 0429   CALCIUM 8.9 03/24/2017 0429   GFRNONAA >60 03/24/2017 0429   GFRAA  >60 03/24/2017 0429   Estimated Creatinine Clearance: 52.7 mL/min (A) (by C-G formula based on SCr of 1.01 mg/dL (H)).  COAG Lab Results  Component Value Date   INR 0.90 03/23/2017    Radiology Ct Head Wo Contrast  Result Date: 03/23/2017 CLINICAL DATA:  Possible syncopal episode this morning EXAM: CT HEAD WITHOUT CONTRAST TECHNIQUE: Contiguous axial images were obtained from the base of the skull through the vertex without intravenous contrast. COMPARISON:  None. FINDINGS: Brain: No evidence of acute infarction, hemorrhage, hydrocephalus, extra-axial collection or mass lesion/mass effect. Vascular: No hyperdense vessel or unexpected calcification. Skull: Normal. Negative for fracture or focal lesion. Sinuses/Orbits: No acute finding. Other: None. IMPRESSION: No acute abnormality noted. Electronically Signed   By: Inez Catalina M.D.   On: 03/23/2017 10:33   Ct Angio Abd/pel W And/or Wo Contrast  Result Date: 03/23/2017 CLINICAL DATA:  bilateral frontal headache and high blood pressure for last 2-3 days. She saw her primary care yesterday who started her on Norvasc. However, she got in the shower this morning and subsequently had dizziness and passed out. Still complains of severe headache. Initial blood pressure about 210/110. 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 Denies any changes in her urination. EXAM: CTA ABDOMEN AND PELVIS WITH CONTRAST TECHNIQUE: Multidetector CT imaging of the abdomen and pelvis was performed using the standard protocol during bolus administration of intravenous contrast. Multiplanar reconstructed images and MIPs were obtained and reviewed to evaluate the vascular anatomy. CONTRAST:  100 mL Isovue 370 IV COMPARISON:  None. FINDINGS: VASCULAR Aorta: Mild tortuosity. No aneurysm, dissection, or stenosis. No atheromatous irregularity. Celiac: Patent without evidence of aneurysm, dissection,  vasculitis  or significant stenosis. SMA: Patent without evidence of aneurysm, dissection, vasculitis or significant stenosis. Renals: Left renal artery is duplicated with separate anterior and posterior branches, both widely patent. Duplicated right renal artery. The superior is dominant and occludes shortly beyond its origin. There is a stent extending through the distal aspect of the superior right renal artery to the hilum, occluded throughout its length. Embolization coil mass is adjacent to the distal aspect of the stent. The inferior right renal artery is diminutive and remains patent, supplying only a portion of the lower pole. IMA: Patent without evidence of aneurysm, dissection, vasculitis or significant stenosis. Inflow: Patent without evidence of aneurysm, dissection, vasculitis or significant stenosis. Proximal Outflow: Bilateral common femoral and visualized portions of the superficial and profunda femoral arteries are patent without evidence of aneurysm, dissection, vasculitis or significant stenosis. Veins: Renal veins patent. Retroaortic left renal vein, an anatomic variant. Patent hepatic veins, portal vein, SMV, splenic vein, IVC, iliac venous system. Review of the MIP images confirms the above findings. NON-VASCULAR Lower chest: Calcified granulomas in both lung bases. No pleural or pericardial effusion. Hepatobiliary: No focal liver abnormality is seen. No gallstones, gallbladder wall thickening, or biliary dilatation. Pancreas: Unremarkable. No pancreatic ductal dilatation or surrounding inflammatory changes. Spleen: Normal in size without focal abnormality. Adrenals/Urinary Tract: There is infarction of most of the right kidney related to the occluded dominant superior right renal artery. Patchy areas of perfused parenchyma in the upper pole on delayed scans, which may be a source of renovascular hypertension. Left kidney unremarkable. Adrenal glands negative. Urinary bladder unremarkable.  Stomach/Bowel: Stomach is within normal limits. Appendix appears normal. No evidence of bowel wall thickening, distention, or inflammatory changes. Lymphatic: No significant vascular findings are present. No enlarged abdominal or pelvic lymph nodes. Reproductive: Uterus and bilateral adnexa are unremarkable. Other: No ascites.  No free air. Musculoskeletal: No acute or significant osseous findings. IMPRESSION: VASCULAR 1. Occlusion of the dominant superior right renal artery, which was previously stented, with infarction of much of the right renal parenchyma. 2. Patchy areas of partially perfused parenchyma in the upper pole right kidney presumably from collateral circulation from the diminutive inferior right renal artery. These may represent a source of renovascular hypertension. NON-VASCULAR 1. No acute findings Electronically Signed   By: Lucrezia Europe M.D.   On: 03/23/2017 17:56      Assessment/Plan 1. Thrombosis/occlusion of right renal artery stent with resultant loss of about 90% of the right renal parenchyma. We had already sacrificed almost half of the right kidney to treat her very difficult and severe right renal artery aneurysm disease. At this point, there is a small lower pole branch which is supplying 10% or so of the right kidney which is likely driving her hypertension. 2. Renovascular hypertension. See above. At this point, for blood pressure control the options would likely be nephrectomy versus embolization to completely devascularized the right kidney. Certainly embolization would be far less invasive and this was discussed with Dr. Erlene Quan. Nephrectomy would likely be open and would carry a high morbidity and even at a healthy patient. I would agree completely with embolization as the planned primary treatment to completely devascularized the right kidney. This would likely result in improved blood pressure but would take several weeks to see the full effect of this. I discussed with the  patient that nephrectomy would be a backup option if embolization does not control her blood pressure ultimately. Risks and benefits were discussed and the patient is agreeable to proceed.  3. Multiple right renal artery aneurysms. Have been previously coil embolized or stented for occlusion/exclusion. These are already treated, but embolization would further reduce any perfusion to the kidney and should be definitive treatment for her aneurysmal disease.   Leotis Pain, MD  03/24/2017 1:20 PM    This note was created with Dragon medical transcription system.  Any error is purely unintentional

## 2017-03-24 NOTE — Progress Notes (Signed)
Pt returned to room. She is a/o and painfree. Rt groin access clean and without bruising. Pos pedal pulses.. Family at bedside.

## 2017-03-24 NOTE — Care Management (Signed)
Renal infarct with history of renal aneurysms and stenting. Was not compliant with daily aspirin. Presented with systolic blood pressure of greater than 753 and diastolic over 005 with severe headache. Vascular consult pending

## 2017-03-24 NOTE — Progress Notes (Signed)
Urology Consult Follow Up  Subjective: Improving blood pressure overnight to near normal this morning. Denies any flank pain. Case discussed with Dr. Lucky Cowboy this a.m. Currently nothing by mouth for possible procedure today.  Anti-infectives: Anti-infectives    None      Current Facility-Administered Medications  Medication Dose Route Frequency Provider Last Rate Last Dose  . acetaminophen (TYLENOL) tablet 650 mg  650 mg Oral Q6H PRN Epifanio Lesches, MD       Or  . acetaminophen (TYLENOL) suppository 650 mg  650 mg Rectal Q6H PRN Epifanio Lesches, MD      . amLODipine (NORVASC) tablet 10 mg  10 mg Oral Daily Epifanio Lesches, MD      . aspirin EC tablet 81 mg  81 mg Oral Daily Epifanio Lesches, MD   81 mg at 03/23/17 2232  . bisacodyl (DULCOLAX) EC tablet 5 mg  5 mg Oral Daily PRN Epifanio Lesches, MD      . calcium-vitamin D (OSCAL WITH D) 500-200 MG-UNIT per tablet 2 tablet  2 tablet Oral Daily Epifanio Lesches, MD      . docusate sodium (COLACE) capsule 100 mg  100 mg Oral BID Epifanio Lesches, MD      . heparin injection 5,000 Units  5,000 Units Subcutaneous Q8H Epifanio Lesches, MD   5,000 Units at 03/24/17 [phone]  . hydrALAZINE (APRESOLINE) tablet 25 mg  25 mg Oral Q8H Epifanio Lesches, MD   Stopped at 03/24/17 0546  . HYDROcodone-acetaminophen (NORCO/VICODIN) 5-325 MG per tablet 1-2 tablet  1-2 tablet Oral Q4H PRN Epifanio Lesches, MD      . labetalol (NORMODYNE) tablet 100 mg  100 mg Oral BID Epifanio Lesches, MD   100 mg at 03/23/17 2232  . lisinopril (PRINIVIL,ZESTRIL) tablet 20 mg  20 mg Oral Daily Epifanio Lesches, MD   20 mg at 03/23/17 2232  . multivitamin with minerals tablet 1 tablet  1 tablet Oral Daily Epifanio Lesches, MD      . ondansetron (ZOFRAN) tablet 4 mg  4 mg Oral Q6H PRN Epifanio Lesches, MD       Or  . ondansetron (ZOFRAN) injection 4 mg  4 mg Intravenous Q6H PRN Epifanio Lesches, MD      . traZODone (DESYREL) tablet  25 mg  25 mg Oral QHS PRN Epifanio Lesches, MD         Objective: Vital signs in last 24 hours: Temp:  [98 F (36.7 C)-98.4 F (36.9 C)] 98 F (36.7 C) (03/28 0800) Pulse Rate:  [64-98] 64 (03/28 0800) Resp:  [13-24] 16 (03/28 0800) BP: (114-194)/(65-122) 125/66 (03/28 0800) SpO2:  [96 %-100 %] 99 % (03/28 0800) Weight:  [135 lb 6.3 oz (61.4 kg)-135 lb 6.4 oz (61.4 kg)] 135 lb 6.3 oz (61.4 kg) (03/28 0529)  Intake/Output from previous day: 03/27 0701 - 03/28 0700 In: -  Out: 300 [Urine:300] Intake/Output this shift: Total I/O In: -  Out: 201 [Urine:200; Stool:1]   Physical Exam  Constitutional: She is oriented to person, place, and time and well-developed, well-nourished, and in no distress.  Husband at bedside  HENT:  Head: Normocephalic.  Cardiovascular: Normal rate.   Pulmonary/Chest: Effort normal and breath sounds normal.  Abdominal: Soft. There is no tenderness.  Musculoskeletal: Normal range of motion.  Neurological: She is alert and oriented to person, place, and time.  Skin: Skin is warm and dry.  Psychiatric: Affect normal.  Vitals reviewed.   Lab Results:   Recent Labs  03/23/17 1909 03/24/17 8299  WBC 7.8 13.0*  HGB 14.2 13.2  HCT 42.1 39.6  PLT 296 265   BMET  Recent Labs  03/23/17 0916 03/23/17 1909 03/24/17 0429  NA 139  --  140  K 3.1*  --  4.0  CL 104  --  110  CO2 25  --  23  GLUCOSE 122*  --  125*  BUN 18  --  24*  CREATININE 0.92 0.89 1.01*  CALCIUM 9.4  --  8.9   PT/INR  Recent Labs  03/23/17 1032  LABPROT 12.1  INR 0.90   ABG No results for input(s): PHART, HCO3 in the last 72 hours.  Invalid input(s): PCO2, PO2  Studies/Results: Ct Head Wo Contrast  Result Date: 03/23/2017 CLINICAL DATA:  Possible syncopal episode this morning EXAM: CT HEAD WITHOUT CONTRAST TECHNIQUE: Contiguous axial images were obtained from the base of the skull through the vertex without intravenous contrast. COMPARISON:  None. FINDINGS:  Brain: No evidence of acute infarction, hemorrhage, hydrocephalus, extra-axial collection or mass lesion/mass effect. Vascular: No hyperdense vessel or unexpected calcification. Skull: Normal. Negative for fracture or focal lesion. Sinuses/Orbits: No acute finding. Other: None. IMPRESSION: No acute abnormality noted. Electronically Signed   By: Inez Catalina M.D.   On: 03/23/2017 10:33   Ct Angio Abd/pel W And/or Wo Contrast  Result Date: 03/23/2017 CLINICAL DATA:  bilateral frontal headache and high blood pressure for last 2-3 days. She saw her primary care yesterday who started her on Norvasc. However, she got in the shower this morning and subsequently had dizziness and passed out. Still complains of severe headache. Initial blood pressure about 210/110. 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 Denies any changes in her urination. EXAM: CTA ABDOMEN AND PELVIS WITH CONTRAST TECHNIQUE: Multidetector CT imaging of the abdomen and pelvis was performed using the standard protocol during bolus administration of intravenous contrast. Multiplanar reconstructed images and MIPs were obtained and reviewed to evaluate the vascular anatomy. CONTRAST:  100 mL Isovue 370 IV COMPARISON:  None. FINDINGS: VASCULAR Aorta: Mild tortuosity. No aneurysm, dissection, or stenosis. No atheromatous irregularity. Celiac: Patent without evidence of aneurysm, dissection, vasculitis or significant stenosis. SMA: Patent without evidence of aneurysm, dissection, vasculitis or significant stenosis. Renals: Left renal artery is duplicated with separate anterior and posterior branches, both widely patent. Duplicated right renal artery. The superior is dominant and occludes shortly beyond its origin. There is a stent extending through the distal aspect of the superior right renal artery to the hilum, occluded throughout its length. Embolization coil mass is  adjacent to the distal aspect of the stent. The inferior right renal artery is diminutive and remains patent, supplying only a portion of the lower pole. IMA: Patent without evidence of aneurysm, dissection, vasculitis or significant stenosis. Inflow: Patent without evidence of aneurysm, dissection, vasculitis or significant stenosis. Proximal Outflow: Bilateral common femoral and visualized portions of the superficial and profunda femoral arteries are patent without evidence of aneurysm, dissection, vasculitis or significant stenosis. Veins: Renal veins patent. Retroaortic left renal vein, an anatomic variant. Patent hepatic veins, portal vein, SMV, splenic vein, IVC, iliac venous system. Review of the MIP images confirms the above findings. NON-VASCULAR Lower chest: Calcified granulomas in both lung bases. No pleural or pericardial effusion. Hepatobiliary: No focal liver abnormality is seen. No gallstones, gallbladder wall thickening, or biliary dilatation. Pancreas: Unremarkable. No pancreatic ductal dilatation or surrounding inflammatory changes. Spleen: Normal in size without focal  abnormality. Adrenals/Urinary Tract: There is infarction of most of the right kidney related to the occluded dominant superior right renal artery. Patchy areas of perfused parenchyma in the upper pole on delayed scans, which may be a source of renovascular hypertension. Left kidney unremarkable. Adrenal glands negative. Urinary bladder unremarkable. Stomach/Bowel: Stomach is within normal limits. Appendix appears normal. No evidence of bowel wall thickening, distention, or inflammatory changes. Lymphatic: No significant vascular findings are present. No enlarged abdominal or pelvic lymph nodes. Reproductive: Uterus and bilateral adnexa are unremarkable. Other: No ascites.  No free air. Musculoskeletal: No acute or significant osseous findings. IMPRESSION: VASCULAR 1. Occlusion of the dominant superior right renal artery, which was  previously stented, with infarction of much of the right renal parenchyma. 2. Patchy areas of partially perfused parenchyma in the upper pole right kidney presumably from collateral circulation from the diminutive inferior right renal artery. These may represent a source of renovascular hypertension. NON-VASCULAR 1. No acute findings Electronically Signed   By: Lucrezia Europe M.D.   On: 03/23/2017 17:56   CT scan partially reviewed this morning.  Assessment: 56 year old female with multiple large right renal artery aneurysm status post coiling embolization and stenting complicated renal infarct, hypertensive emergency.    BP steadily improving throughout the course of past 24 hours, near normal this morning.  Case was discussed this morning with Dr. Lucky Cowboy.  Options including proceeding to nephrectomy for blood pressure control versus embolization of residual accessory lower pole artery reviewed as options. Unfortunately, if nephrectomy were pursued, this would likely need to be an open procedure in the presence of coil/stents within the artery.  Plan: -NPO for angiography, possible embolization today with Dr. Lucky Cowboy -If further embolization of the accessory/lower pole artery failed to control her blood pressure, we'll consider proceeding to nephrectomy -Urology will continue to follow this patient closely      LOS: 1 day    Hollice Espy 03/24/2017

## 2017-03-24 NOTE — Progress Notes (Signed)
[place] [facility] [place] NAME: Floris Neuhaus    MR#:  [id number]  DATE OF BIRTH:  1961/08/02  SUBJECTIVE:  CHIEF COMPLAINT:   Chief Complaint  Patient presents with  . Near Syncope  . Hypertension     Hx of renal artery stenosis, s/p stent- came with uncontrolled Htn and headache. Found to have occluded renal artery stent and right renal infarct. BP under control now with meds. No pain.  REVIEW OF SYSTEMS:  CONSTITUTIONAL: No fever, fatigue or weakness.  EYES: No blurred or double vision.  EARS, NOSE, AND THROAT: No tinnitus or ear pain.  RESPIRATORY: No cough, shortness of breath, wheezing or hemoptysis.  CARDIOVASCULAR: No chest pain, orthopnea, edema.  GASTROINTESTINAL: No nausea, vomiting, diarrhea or abdominal pain.  GENITOURINARY: No dysuria, hematuria.  ENDOCRINE: No polyuria, nocturia,  HEMATOLOGY: No anemia, easy bruising or bleeding SKIN: No rash or lesion. MUSCULOSKELETAL: No joint pain or arthritis.   NEUROLOGIC: No tingling, numbness, weakness.  PSYCHIATRY: No anxiety or depression.   ROS  DRUG ALLERGIES:   Allergies  Allergen Reactions  . Bee Venom Anaphylaxis  . Ivp Dye [Iodinated Diagnostic Agents] Rash    VITALS:  Blood pressure 121/77, pulse 88, temperature 98.2 F (36.8 C), temperature source Oral, resp. rate 14, height 5\' 3"  (1.6 m), weight 61.4 kg (135 lb 6.3 oz), SpO2 97 %.  PHYSICAL EXAMINATION:  GENERAL:  56 y.o.-year-old patient lying in the bed with no acute distress.  EYES: Pupils equal, round, reactive to light and accommodation. No scleral icterus. Extraocular muscles intact.  HEENT: Head atraumatic, normocephalic. Oropharynx and nasopharynx clear.  NECK:  Supple, no jugular venous distention. No thyroid enlargement, no tenderness.  LUNGS: Normal breath sounds bilaterally, no wheezing, rales,rhonchi or crepitation. No use of accessory muscles of respiration.  CARDIOVASCULAR: S1, S2 normal. No murmurs,  rubs, or gallops.  ABDOMEN: Soft, nontender, nondistended. Bowel sounds present. No organomegaly or mass.  EXTREMITIES: No pedal edema, cyanosis, or clubbing.  NEUROLOGIC: Cranial nerves II through XII are intact. Muscle strength 5/5 in all extremities. Sensation intact. Gait not checked.  PSYCHIATRIC: The patient is alert and oriented x 3.  SKIN: No obvious rash, lesion, or ulcer.   Physical Exam LABORATORY PANEL:   CBC  Recent Labs Lab 03/24/17 0429  WBC 13.0*  HGB 13.2  HCT 39.6  PLT 265   ------------------------------------------------------------------------------------------------------------------  Chemistries   Recent Labs Lab 03/24/17 0429  NA 140  K 4.0  CL 110  CO2 23  GLUCOSE 125*  BUN 24*  CREATININE 1.01*  CALCIUM 8.9   ------------------------------------------------------------------------------------------------------------------  Cardiac Enzymes No results for input(s): TROPONINI in the last 168 hours. ------------------------------------------------------------------------------------------------------------------  RADIOLOGY:  Ct Head Wo Contrast  Result Date: 03/23/2017 CLINICAL DATA:  Possible syncopal episode this morning EXAM: CT HEAD WITHOUT CONTRAST TECHNIQUE: Contiguous axial images were obtained from the base of the skull through the vertex without intravenous contrast. COMPARISON:  None. FINDINGS: Brain: No evidence of acute infarction, hemorrhage, hydrocephalus, extra-axial collection or mass lesion/mass effect. Vascular: No hyperdense vessel or unexpected calcification. Skull: Normal. Negative for fracture or focal lesion. Sinuses/Orbits: No acute finding. Other: None. IMPRESSION: No acute abnormality noted. Electronically Signed   By: Inez Catalina M.D.   On: 03/23/2017 10:33   Ct Angio Abd/pel W And/or Wo Contrast  Result Date: 03/23/2017 CLINICAL DATA:  bilateral frontal headache and high blood pressure for last 2-3 days. She saw her  primary care yesterday who started her on Norvasc.  However, she got in the shower this morning and subsequently had dizziness and passed out. Still complains of severe headache. Initial blood pressure about 210/110. 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 Denies any changes in her urination. EXAM: CTA ABDOMEN AND PELVIS WITH CONTRAST TECHNIQUE: Multidetector CT imaging of the abdomen and pelvis was performed using the standard protocol during bolus administration of intravenous contrast. Multiplanar reconstructed images and MIPs were obtained and reviewed to evaluate the vascular anatomy. CONTRAST:  100 mL Isovue 370 IV COMPARISON:  None. FINDINGS: VASCULAR Aorta: Mild tortuosity. No aneurysm, dissection, or stenosis. No atheromatous irregularity. Celiac: Patent without evidence of aneurysm, dissection, vasculitis or significant stenosis. SMA: Patent without evidence of aneurysm, dissection, vasculitis or significant stenosis. Renals: Left renal artery is duplicated with separate anterior and posterior branches, both widely patent. Duplicated right renal artery. The superior is dominant and occludes shortly beyond its origin. There is a stent extending through the distal aspect of the superior right renal artery to the hilum, occluded throughout its length. Embolization coil mass is adjacent to the distal aspect of the stent. The inferior right renal artery is diminutive and remains patent, supplying only a portion of the lower pole. IMA: Patent without evidence of aneurysm, dissection, vasculitis or significant stenosis. Inflow: Patent without evidence of aneurysm, dissection, vasculitis or significant stenosis. Proximal Outflow: Bilateral common femoral and visualized portions of the superficial and profunda femoral arteries are patent without evidence of aneurysm, dissection, vasculitis or significant stenosis. Veins: Renal veins  patent. Retroaortic left renal vein, an anatomic variant. Patent hepatic veins, portal vein, SMV, splenic vein, IVC, iliac venous system. Review of the MIP images confirms the above findings. NON-VASCULAR Lower chest: Calcified granulomas in both lung bases. No pleural or pericardial effusion. Hepatobiliary: No focal liver abnormality is seen. No gallstones, gallbladder wall thickening, or biliary dilatation. Pancreas: Unremarkable. No pancreatic ductal dilatation or surrounding inflammatory changes. Spleen: Normal in size without focal abnormality. Adrenals/Urinary Tract: There is infarction of most of the right kidney related to the occluded dominant superior right renal artery. Patchy areas of perfused parenchyma in the upper pole on delayed scans, which may be a source of renovascular hypertension. Left kidney unremarkable. Adrenal glands negative. Urinary bladder unremarkable. Stomach/Bowel: Stomach is within normal limits. Appendix appears normal. No evidence of bowel wall thickening, distention, or inflammatory changes. Lymphatic: No significant vascular findings are present. No enlarged abdominal or pelvic lymph nodes. Reproductive: Uterus and bilateral adnexa are unremarkable. Other: No ascites.  No free air. Musculoskeletal: No acute or significant osseous findings. IMPRESSION: VASCULAR 1. Occlusion of the dominant superior right renal artery, which was previously stented, with infarction of much of the right renal parenchyma. 2. Patchy areas of partially perfused parenchyma in the upper pole right kidney presumably from collateral circulation from the diminutive inferior right renal artery. These may represent a source of renovascular hypertension. NON-VASCULAR 1. No acute findings Electronically Signed   By: Lucrezia Europe M.D.   On: 03/23/2017 17:56    ASSESSMENT AND PLAN:   Active Problems:   Malignant HTN with heart disease, w/o CHF, w/o chronic kidney disease   Acute renal artery occlusion (HCC)    Hypertensive urgency   Renal infarction (HCC)  * Malignant hypertension:  With recent right renal artery aneurysm repair, stent placed in right renal artery. Now has stent stenosis, right renal artery infarct.   monitor on telemetry,   Stable now  with meds,    We may need to d/c meds once kidney is completely infarcted and BP comes down.   Appreciated urology and vascular, going for angiogram today to coil renal artery.   If it does not help, may need nephrectomy as per urology.  2;Hypokalemia: Replace the potassium     All the records are reviewed and case discussed with Care Management/Social Workerr. Management plans discussed with the patient, family and they are in agreement.  CODE STATUS: full  TOTAL TIME TAKING CARE OF THIS PATIENT: 35 minutes.     POSSIBLE D/C IN 1-2 DAYS, DEPENDING ON CLINICAL CONDITION.   Vaughan Basta M.D on 03/24/2017   Between 7am to 6pm - Pager - [phone]  After 6pm go to [email or web address] - password [email or web address] [facility]  Office  [phone]  CC: Primary care physician; [name], PA-C  Note: This dictation was prepared with Dragon dictation along with smaller phrase technology. Any transcriptional errors that result from this process are unintentional.

## 2017-03-25 ENCOUNTER — Telehealth: Payer: Self-pay | Admitting: Physician Assistant

## 2017-03-25 ENCOUNTER — Telehealth: Payer: Self-pay

## 2017-03-25 LAB — GLUCOSE, CAPILLARY: Glucose-Capillary: 103 mg/dL — ABNORMAL HIGH (ref 65–99)

## 2017-03-25 LAB — HIV ANTIBODY (ROUTINE TESTING W REFLEX): HIV Screen 4th Generation wRfx: NONREACTIVE

## 2017-03-25 MED ORDER — LISINOPRIL 20 MG PO TABS: 20.0000 mg | ORAL_TABLET | Freq: Every day | ORAL | 0 refills | Status: DC

## 2017-03-25 MED ORDER — AMLODIPINE BESYLATE 5 MG PO TABS: 5.0000 mg | ORAL_TABLET | Freq: Every day | ORAL | 1 refills | Status: DC

## 2017-03-25 MED ORDER — HYDROCODONE-ACETAMINOPHEN 5-325 MG PO TABS: 1.0000 | ORAL_TABLET | Freq: Four times a day (QID) | ORAL | 0 refills | Status: DC | PRN

## 2017-03-25 NOTE — Discharge Summary (Signed)
[place] [facility] [place] NAME: Natalie Dickerson    MR#:  [id number]  DATE OF BIRTH:  Jun 05, 1961  DATE OF ADMISSION:  03/23/2017 ADMITTING PHYSICIAN: Epifanio Lesches, MD  DATE OF DISCHARGE: No discharge date for patient encounter.  PRIMARY CARE PHYSICIAN: [name], PA-C    ADMISSION DIAGNOSIS:  Renal infarction [facility]) [N28.0] Acute renal artery occlusion (HCC) [N28.0] Hypertensive urgency [I16.0] Syncope, unspecified syncope type [R55]  DISCHARGE DIAGNOSIS:  Active Problems:   Malignant HTN with heart disease, w/o CHF, w/o chronic kidney disease   Acute renal artery occlusion (HCC)   Hypertensive urgency   Renal infarction ([place])   SECONDARY DIAGNOSIS:   Past Medical History:  Diagnosis Date  . Cancer [facility])    skin ca    HOSPITAL COURSE:   * Malignanthypertension: renal infarct With recent right renal artery aneurysm repair, stent placed in right renal artery.Now has stent stenosis, right renal artery infarct.   monitored on telemetry,   Stable now with meds,    We may need to d/c meds once kidney is completely infarcted and BP comes down.   Appreciated urology and vascular, done angiogram to coil renal artery.    Advised her to check her BP daily twice before taking meds, and not to take the medicine if SYtolic BP is < 767.  2;Hypokalemia: Replace the potassium  DISCHARGE CONDITIONS:   Stable.  CONSULTS OBTAINED:  Treatment Team:  Murlean Iba, MD Ardis Hughs, MD Hollice Espy, MD Algernon Huxley, MD  DRUG ALLERGIES:   Allergies  Allergen Reactions  . Bee Venom Anaphylaxis  . Ivp Dye [Iodinated Diagnostic Agents] Rash    DISCHARGE MEDICATIONS:   Current Discharge Medication List    START taking these medications   Details  HYDROcodone-acetaminophen (NORCO/VICODIN) 5-325 MG tablet Take 1 tablet by mouth every 6 (six) hours as needed for moderate pain or severe pain. Qty: 10 tablet,  Refills: 0    lisinopril (PRINIVIL,ZESTRIL) 20 MG tablet Take 1 tablet (20 mg total) by mouth daily. Qty: 30 tablet, Refills: 0      CONTINUE these medications which have CHANGED   Details  amLODipine (NORVASC) 5 MG tablet Take 1 tablet (5 mg total) by mouth daily. In evening. Qty: 30 tablet, Refills: 1   Associated Diagnoses: Hypertension, unspecified type      CONTINUE these medications which have NOT CHANGED   Details  aspirin EC 81 MG EC tablet Take 1 tablet (81 mg total) by mouth daily. Qty: 90 tablet, Refills: 3    Calcium Carbonate-Vit D-Min (CALCIUM 1200) 1200-1000 MG-UNIT CHEW Chew 1 each by mouth daily.    Multiple Vitamins-Minerals (MULTIVITAMIN WITH MINERALS) tablet Take 1 tablet by mouth daily.    Omega-3 Fatty Acids (FISH OIL) 1000 MG CAPS Take 1 capsule by mouth daily.    vitamin B-12 (CYANOCOBALAMIN) 1000 MCG tablet Take 1,000 mcg by mouth daily.      STOP taking these medications     ibuprofen (ADVIL,MOTRIN) 600 MG tablet      predniSONE (STERAPRED UNI-PAK 21 TAB) 10 MG (21) TBPK tablet          DISCHARGE INSTRUCTIONS:    Follow with nephrology and PMD>  If you experience worsening of your admission symptoms, develop shortness of breath, life threatening emergency, suicidal or homicidal thoughts you must seek medical attention immediately by calling 911 or calling your MD immediately  if symptoms less severe.  You Must read complete instructions/literature  along with all the possible adverse reactions/side effects for all the Medicines you take and that have been prescribed to you. Take any new Medicines after you have completely understood and accept all the possible adverse reactions/side effects.   Please note  You were cared for by a hospitalist during your hospital stay. If you have any questions about your discharge medications or the care you received while you were in the hospital after you are discharged, you can call the unit and asked to  speak with the hospitalist on call if the hospitalist that took care of you is not available. Once you are discharged, your primary care physician will handle any further medical issues. Please note that NO REFILLS for any discharge medications will be authorized once you are discharged, as it is imperative that you return to your primary care physician (or establish a relationship with a primary care physician if you do not have one) for your aftercare needs so that they can reassess your need for medications and monitor your lab values.    Today   CHIEF COMPLAINT:   Chief Complaint  Patient presents with  . Near Syncope  . Hypertension    HISTORY OF PRESENT ILLNESS:  Natalie Dickerson  is a 56 y.o. female with a known history of Recent surgery for right renal artery aneurysm with coiling, stent by Dr. Lucky Cowboy on Jan 22nd came because of severe headache. Patient went to primary doctor yesterday because of headache and found to have elevated blood pressure and started on Norvasc. Patient continued to have a headache last night, and  she passed out in the bathroom this morning. She came with severe headache with blood pressure of 190/106 when she came. She had a CT head which is unremarkable so she had LP done the emergency room, and LP also  is unremarkable. Patient  Had clonidine 0.1 mg, Labetalol 40 mg IV in the emergency room. Patient blood pressures improved briefly but it is going up again to 172/117. Spoke with nephrology, urologist. Patient has stent stenosis, right renal artery infarct. This her headache resolved. No abdominal pain. No flank pain.    VITAL SIGNS:  Blood pressure 131/63, pulse 81, temperature 97.8 F (36.6 C), temperature source Axillary, resp. rate 16, height 5\' 3"  (1.6 m), weight 63.4 kg (139 lb 12.4 oz), SpO2 97 %.  I/O:   Intake/Output Summary (Last 24 hours) at 03/25/17 1141 Last data filed at 03/25/17 1013  Gross per 24 hour  Intake              480 ml  Output              1100 ml  Net             -620 ml    PHYSICAL EXAMINATION:  GENERAL:  56 y.o.-year-old patient lying in the bed with no acute distress.  EYES: Pupils equal, round, reactive to light and accommodation. No scleral icterus. Extraocular muscles intact.  HEENT: Head atraumatic, normocephalic. Oropharynx and nasopharynx clear.  NECK:  Supple, no jugular venous distention. No thyroid enlargement, no tenderness.  LUNGS: Normal breath sounds bilaterally, no wheezing, rales,rhonchi or crepitation. No use of accessory muscles of respiration.  CARDIOVASCULAR: S1, S2 normal. No murmurs, rubs, or gallops.  ABDOMEN: Soft, non-tender, non-distended. Bowel sounds present. No organomegaly or mass.  EXTREMITIES: No pedal edema, cyanosis, or clubbing.  NEUROLOGIC: Cranial nerves II through XII are intact. Muscle strength 5/5 in all extremities. Sensation intact. Gait not  checked.  PSYCHIATRIC: The patient is alert and oriented x 3.  SKIN: No obvious rash, lesion, or ulcer.   DATA REVIEW:   CBC  Recent Labs Lab 03/24/17 0429  WBC 13.0*  HGB 13.2  HCT 39.6  PLT 265    Chemistries   Recent Labs Lab 03/24/17 0429  NA 140  K 4.0  CL 110  CO2 23  GLUCOSE 125*  BUN 24*  CREATININE 1.01*  CALCIUM 8.9    Cardiac Enzymes No results for input(s): TROPONINI in the last 168 hours.  Microbiology Results  Results for orders placed or performed during the hospital encounter of 03/23/17  CSF culture     Status: None (Preliminary result)   Collection Time: 03/23/17 12:22 PM  Result Value Ref Range Status   Specimen Description CSF  Final   Special Requests NONE  Final   Gram Stain CYTOSPIN SLIDE RARE WBC SEEN NO ORGANISMS SEEN   Final   Culture   Final    NO GROWTH < 24 HOURS Performed at [facility], [place] [address] [place], [place] [address]    Report Status PENDING  Incomplete    RADIOLOGY:  Ct Angio Abd/pel W And/or Wo Contrast  Result Date: 03/23/2017 CLINICAL DATA:   bilateral frontal headache and high blood pressure for last 2-3 days. She saw her primary care yesterday who started her on Norvasc. However, she got in the shower this morning and subsequently had dizziness and passed out. Still complains of severe headache. Initial blood pressure about 210/110. 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 Denies any changes in her urination. EXAM: CTA ABDOMEN AND PELVIS WITH CONTRAST TECHNIQUE: Multidetector CT imaging of the abdomen and pelvis was performed using the standard protocol during bolus administration of intravenous contrast. Multiplanar reconstructed images and MIPs were obtained and reviewed to evaluate the vascular anatomy. CONTRAST:  100 mL Isovue 370 IV COMPARISON:  None. FINDINGS: VASCULAR Aorta: Mild tortuosity. No aneurysm, dissection, or stenosis. No atheromatous irregularity. Celiac: Patent without evidence of aneurysm, dissection, vasculitis or significant stenosis. SMA: Patent without evidence of aneurysm, dissection, vasculitis or significant stenosis. Renals: Left renal artery is duplicated with separate anterior and posterior branches, both widely patent. Duplicated right renal artery. The superior is dominant and occludes shortly beyond its origin. There is a stent extending through the distal aspect of the superior right renal artery to the hilum, occluded throughout its length. Embolization coil mass is adjacent to the distal aspect of the stent. The inferior right renal artery is diminutive and remains patent, supplying only a portion of the lower pole. IMA: Patent without evidence of aneurysm, dissection, vasculitis or significant stenosis. Inflow: Patent without evidence of aneurysm, dissection, vasculitis or significant stenosis. Proximal Outflow: Bilateral common femoral and visualized portions of the superficial and profunda femoral arteries are patent without  evidence of aneurysm, dissection, vasculitis or significant stenosis. Veins: Renal veins patent. Retroaortic left renal vein, an anatomic variant. Patent hepatic veins, portal vein, SMV, splenic vein, IVC, iliac venous system. Review of the MIP images confirms the above findings. NON-VASCULAR Lower chest: Calcified granulomas in both lung bases. No pleural or pericardial effusion. Hepatobiliary: No focal liver abnormality is seen. No gallstones, gallbladder wall thickening, or biliary dilatation. Pancreas: Unremarkable. No pancreatic ductal dilatation or surrounding inflammatory changes. Spleen: Normal in size without focal abnormality. Adrenals/Urinary Tract: There is infarction of most of the right kidney related to  the occluded dominant superior right renal artery. Patchy areas of perfused parenchyma in the upper pole on delayed scans, which may be a source of renovascular hypertension. Left kidney unremarkable. Adrenal glands negative. Urinary bladder unremarkable. Stomach/Bowel: Stomach is within normal limits. Appendix appears normal. No evidence of bowel wall thickening, distention, or inflammatory changes. Lymphatic: No significant vascular findings are present. No enlarged abdominal or pelvic lymph nodes. Reproductive: Uterus and bilateral adnexa are unremarkable. Other: No ascites.  No free air. Musculoskeletal: No acute or significant osseous findings. IMPRESSION: VASCULAR 1. Occlusion of the dominant superior right renal artery, which was previously stented, with infarction of much of the right renal parenchyma. 2. Patchy areas of partially perfused parenchyma in the upper pole right kidney presumably from collateral circulation from the diminutive inferior right renal artery. These may represent a source of renovascular hypertension. NON-VASCULAR 1. No acute findings Electronically Signed   By: Lucrezia Europe M.D.   On: 03/23/2017 17:56    EKG:   Orders placed or performed during the hospital encounter  of 03/23/17  . ED EKG  . ED EKG      Management plans discussed with the patient, family and they are in agreement.  CODE STATUS:     Code Status Orders        Start     Ordered   03/23/17 1902  Full code  Continuous     03/23/17 1909    Code Status History    Date Active Date Inactive Code Status Order ID Comments User Context   01/18/2017  1:38 PM 01/20/2017  2:29 PM Full Code [id number]  Algernon Huxley, MD Inpatient      TOTAL TIME TAKING CARE OF THIS PATIENT: 35 minutes.    Vaughan Basta M.D on 03/25/2017 at 11:41 AM  Between 7am to 6pm - Pager - [phone]  After 6pm go to [email or web address] - password [email or web address] [facility]  Office  [phone]  CC: Primary care physician; [name], PA-C   Note: This dictation was prepared with Dragon dictation along with smaller phrase technology. Any transcriptional errors that result from this process are unintentional.

## 2017-03-25 NOTE — Telephone Encounter (Signed)
Transition Care Management Follow-up Telephone Call    Date discharged? 03/25/17  How have you been since you were released from the hospital? Patient states she is doing well. She has checked her B/p once since being home and it was 100/68.  Any patient concerns? None   Items Reviewed:  Medications reviewed: Yes  Allergies reviewed: Yes  Dietary changes reviewed: Yes, low sodium and heart healthy  Referrals reviewed: Yes, Dr. Lucky Cowboy and Dr. Candiss Norse   Functional Questionnaire:  Independent - I Dependent - D    Activities of Daily Living (ADLs):    Personal hygiene - I Dressing - I Eating - I Maintaining continence - I Transferring - I   Independent Activities of Daily Living (iADLs): Basic communication skills - I Transportation - I Meal preparation - I Shopping - I Housework - I Managing medications - I  Managing personal finances - I   Confirmed importance and date/time of follow-up visits scheduled YES  Provider Appointment booked with PCP 03/29/17 @ 3:45 PM.  Confirmed with patient if condition begins to worsen call PCP or go to the ER.  Patient was given the office number and encouraged to call back with question or concerns: YES

## 2017-03-25 NOTE — Telephone Encounter (Signed)
Can we do telephone call. Thanks.

## 2017-03-25 NOTE — Plan of Care (Signed)
Problem: Safety: Goal: Ability to remain free from injury will improve Outcome: Completed/Met Date Met: 03/25/17 Pt does not score high enough for a bed alarm, but non skid socks are on & encouraged pt to call if she feels she cannot get up by herself or feels weak. Will continue to monitor.  Problem: Physical Regulation: Goal: Ability to maintain clinical measurements within normal limits will improve Outcome: Progressing Up to bathroom independently, tolerated well

## 2017-03-25 NOTE — Consult Note (Signed)
Date: 03/25/2017                  Patient Name:  Natalie Dickerson  MRN: [id number]  DOB: [birth year]  Age / Sex: 56 y.o., female         PCP: [name], PA-C                 Service Requesting Consult: IM/ Dr Vianne Bulls                 Reason for Consult: Accelerated HTN            History of Present Illness: Patient is a pleasant 56 year old Caucasian female with history of severe right renal artery aneurysm, which required extensive coil embolization and stenting excluding 40-50% of the right kidney about 2-3 months ago.  Patient states that for about 3-4 weeks he has been having severe hypertension, syncopal episodes, retinal hemorrhages and headaches from severe hypertension She had a CT angiogram which showed thrombosis of right renal artery stents  She presented to the emergency room for severe hypertension, left retinal hemorrhages seen by optometrist, headaches and nausea on March 27  She then underwent coil embolization of the small inferior pole branch of the renal artery  During her hospital course, she was treated with multiple antihypertensives.  Her blood pressure now has improved.  It was 131/63 this morning     Medications: Outpatient medications: No prescriptions prior to admission.    Current medications: Current Facility-Administered Medications  Medication Dose Route Frequency Provider Last Rate Last Dose  . acetaminophen (TYLENOL) tablet 650 mg  650 mg Oral Q6H PRN Epifanio Lesches, MD       Or  . acetaminophen (TYLENOL) suppository 650 mg  650 mg Rectal Q6H PRN Epifanio Lesches, MD      . amLODipine (NORVASC) tablet 10 mg  10 mg Oral Daily Epifanio Lesches, MD   10 mg at 03/25/17 0947  . aspirin EC tablet 81 mg  81 mg Oral Daily Epifanio Lesches, MD   81 mg at 03/25/17 0949  . bisacodyl (DULCOLAX) EC tablet 5 mg  5 mg Oral Daily PRN Epifanio Lesches, MD      . calcium-vitamin D (OSCAL WITH D) 500-200 MG-UNIT per tablet 2 tablet  2  tablet Oral Daily Epifanio Lesches, MD      . docusate sodium (COLACE) capsule 100 mg  100 mg Oral BID Epifanio Lesches, MD   100 mg at 03/25/17 0948  . heparin injection 5,000 Units  5,000 Units Subcutaneous Q8H Epifanio Lesches, MD   5,000 Units at 03/25/17 0654  . HYDROcodone-acetaminophen (NORCO/VICODIN) 5-325 MG per tablet 1-2 tablet  1-2 tablet Oral Q4H PRN Epifanio Lesches, MD      . labetalol (NORMODYNE) tablet 100 mg  100 mg Oral BID Vaughan Basta, MD   100 mg at 03/25/17 0948  . lisinopril (PRINIVIL,ZESTRIL) tablet 20 mg  20 mg Oral Daily Epifanio Lesches, MD   20 mg at 03/25/17 0948  . multivitamin with minerals tablet 1 tablet  1 tablet Oral Daily Epifanio Lesches, MD      . ondansetron (ZOFRAN) tablet 4 mg  4 mg Oral Q6H PRN Epifanio Lesches, MD       Or  . ondansetron (ZOFRAN) injection 4 mg  4 mg Intravenous Q6H PRN Epifanio Lesches, MD      . traZODone (DESYREL) tablet 25 mg  25 mg Oral QHS PRN Epifanio Lesches, MD  Current Outpatient Prescriptions  Medication Sig Dispense Refill  . aspirin EC 81 MG EC tablet Take 1 tablet (81 mg total) by mouth daily. 90 tablet 3  . Calcium Carbonate-Vit D-Min (CALCIUM 1200) 1200-1000 MG-UNIT CHEW Chew 1 each by mouth daily.    . Multiple Vitamins-Minerals (MULTIVITAMIN WITH MINERALS) tablet Take 1 tablet by mouth daily.    . Omega-3 Fatty Acids (FISH OIL) 1000 MG CAPS Take 1 capsule by mouth daily.    . vitamin B-12 (CYANOCOBALAMIN) 1000 MCG tablet Take 1,000 mcg by mouth daily.    Marland Kitchen amLODipine (NORVASC) 5 MG tablet Take 1 tablet (5 mg total) by mouth daily. In evening. 30 tablet 1  . HYDROcodone-acetaminophen (NORCO/VICODIN) 5-325 MG tablet Take 1 tablet by mouth every 6 (six) hours as needed for moderate pain or severe pain. 10 tablet 0  . [START ON 03/26/2017] lisinopril (PRINIVIL,ZESTRIL) 20 MG tablet Take 1 tablet (20 mg total) by mouth daily. 30 tablet 0      Allergies: Allergies  Allergen  Reactions  . Bee Venom Anaphylaxis  . Ivp Dye [Iodinated Diagnostic Agents] Rash      Past Medical History: Past Medical History:  Diagnosis Date  . Cancer ([place])    skin ca     Past Surgical History: Past Surgical History:  Procedure Laterality Date  . FINGER SURGERY    . PERIPHERAL VASCULAR CATHETERIZATION N/A 01/18/2017   Procedure: Renal Angiography;  Surgeon: Algernon Huxley, MD;  Location: [facility];  Service: Cardiovascular;  Laterality: N/A;  . PERIPHERAL VASCULAR CATHETERIZATION  01/18/2017   Procedure: Renal Intervention;  Surgeon: Algernon Huxley, MD;  Location: [facility];  Service: Cardiovascular;;     Family History: Family History  Problem Relation Age of Onset  . Hypertension Mother   . Breast cancer Mother   . Cancer Mother   . Breast cancer Father   . Hypertension Sister   . Hypertension Brother      Social History: Social History   Social History  . Marital status: Married    Spouse name: N/A  . Number of children: N/A  . Years of education: N/A   Occupational History  . Not on file.   Social History Main Topics  . Smoking status: Former Smoker    Years: 5.00  . Smokeless tobacco: Never Used     Comment: Smoked for about 5 years, smoked about 1 pack per day. Quit in 1984  . Alcohol use Yes     Comment: Occasional alcohol use; Drinks 2-4 glasses of wine per week.  . Drug use: No  . Sexual activity: Not on file   Other Topics Concern  . Not on file   Social History Narrative  . No narrative on file     Review of Systems: Gen: no headaches at present HEENT: no vision complaints at present CV: no shortness of breath or chest pain now Resp: no cough or sputum production no GQ:QPYPPJKD is okay GU : no problems with voiding MS: chronic neck pain Derm:  no complaints Psych:no complaints Heme: no complaints Neuro: .  No complaints Endocrineno complaints  Vital Signs: Blood pressure 131/63, pulse 81, temperature  97.8 F (36.6 C), temperature source Axillary, resp. rate 16, height 5\' 3"  (1.6 m), weight 63.4 kg (139 lb 12.4 oz), SpO2 97 %.   Intake/Output Summary (Last 24 hours) at 03/25/17 1436 Last data filed at 03/25/17 1013  Gross per 24 hour  Intake  480 ml  Output             1100 ml  Net             -620 ml    Weight trends: [employer]   03/23/17 2130 03/24/17 0529 03/25/17 0451  Weight: 61.4 kg (135 lb 6.4 oz) 61.4 kg (135 lb 6.3 oz) 63.4 kg (139 lb 12.4 oz)    Physical Exam: General:  no acute distress, laying in the bed  HEENT Anicteric, moist mucous membranes  Neck:  supple  Lungs: normal breathing effort, clear to auscultation  Heart::  no rub or gallops  Abdomen: Soft, nontender  Extremities:  no peripheral edema  Neurologic: Alert, oriented  Skin: No acute rashes             Lab results: Basic Metabolic Panel:  Recent Labs Lab 03/23/17 0916 03/23/17 1909 03/24/17 0429  NA 139  --  140  K 3.1*  --  4.0  CL 104  --  110  CO2 25  --  23  GLUCOSE 122*  --  125*  BUN 18  --  24*  CREATININE 0.92 0.89 1.01*  CALCIUM 9.4  --  8.9    Liver Function Tests: No results for input(s): AST, ALT, ALKPHOS, BILITOT, PROT, ALBUMIN in the last 168 hours. No results for input(s): LIPASE, AMYLASE in the last 168 hours. No results for input(s): AMMONIA in the last 168 hours.  CBC:  Recent Labs Lab 03/23/17 1909 03/24/17 0429  WBC 7.8 13.0*  HGB 14.2 13.2  HCT 42.1 39.6  MCV 89.2 90.2  PLT 296 265    Cardiac Enzymes: No results for input(s): CKTOTAL, TROPONINI in the last 168 hours.  BNP: Invalid input(s): POCBNP  CBG:  Recent Labs Lab 03/24/17 0812 03/25/17 0731  GLUCAP 105* 103*    Microbiology: Recent Results (from the past 720 hour(s))  CSF culture     Status: None (Preliminary result)   Collection Time: 03/23/17 12:22 PM  Result Value Ref Range Status   Specimen Description CSF  Final   Special Requests NONE  Final   Gram  Stain CYTOSPIN SLIDE RARE WBC SEEN NO ORGANISMS SEEN   Final   Culture   Final    NO GROWTH 2 DAYS Performed at [facility], [address], [place], [place] [id number]    Report Status PENDING  Incomplete     Coagulation Studies:  Recent Labs  03/23/17 1032  LABPROT 12.1  INR 0.90    Urinalysis:  Recent Labs  03/23/17 0917  COLORURINE YELLOW*  LABSPEC 1.016  PHURINE 7.0  GLUCOSEU 50*  HGBUR NEGATIVE  BILIRUBINUR NEGATIVE  KETONESUR NEGATIVE  PROTEINUR 100*  NITRITE NEGATIVE  LEUKOCYTESUR NEGATIVE        Imaging: Ct Angio Abd/pel W And/or Wo Contrast  Result Date: 03/23/2017 CLINICAL DATA:  bilateral frontal headache and high blood pressure for last 2-3 days. She saw her primary care yesterday who started her on Norvasc. However, she got in the shower this morning and subsequently had dizziness and passed out. Still complains of severe headache. Initial blood pressure about 210/110. 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 Denies any changes in her urination. EXAM: CTA ABDOMEN AND PELVIS WITH CONTRAST TECHNIQUE: Multidetector CT imaging of the abdomen and pelvis was performed using the standard protocol during bolus administration of intravenous contrast. Multiplanar reconstructed images and  MIPs were obtained and reviewed to evaluate the vascular anatomy. CONTRAST:  100 mL Isovue 370 IV COMPARISON:  None. FINDINGS: VASCULAR Aorta: Mild tortuosity. No aneurysm, dissection, or stenosis. No atheromatous irregularity. Celiac: Patent without evidence of aneurysm, dissection, vasculitis or significant stenosis. SMA: Patent without evidence of aneurysm, dissection, vasculitis or significant stenosis. Renals: Left renal artery is duplicated with separate anterior and posterior branches, both widely patent. Duplicated right renal artery. The superior is dominant and occludes shortly beyond its  origin. There is a stent extending through the distal aspect of the superior right renal artery to the hilum, occluded throughout its length. Embolization coil mass is adjacent to the distal aspect of the stent. The inferior right renal artery is diminutive and remains patent, supplying only a portion of the lower pole. IMA: Patent without evidence of aneurysm, dissection, vasculitis or significant stenosis. Inflow: Patent without evidence of aneurysm, dissection, vasculitis or significant stenosis. Proximal Outflow: Bilateral common femoral and visualized portions of the superficial and profunda femoral arteries are patent without evidence of aneurysm, dissection, vasculitis or significant stenosis. Veins: Renal veins patent. Retroaortic left renal vein, an anatomic variant. Patent hepatic veins, portal vein, SMV, splenic vein, IVC, iliac venous system. Review of the MIP images confirms the above findings. NON-VASCULAR Lower chest: Calcified granulomas in both lung bases. No pleural or pericardial effusion. Hepatobiliary: No focal liver abnormality is seen. No gallstones, gallbladder wall thickening, or biliary dilatation. Pancreas: Unremarkable. No pancreatic ductal dilatation or surrounding inflammatory changes. Spleen: Normal in size without focal abnormality. Adrenals/Urinary Tract: There is infarction of most of the right kidney related to the occluded dominant superior right renal artery. Patchy areas of perfused parenchyma in the upper pole on delayed scans, which may be a source of renovascular hypertension. Left kidney unremarkable. Adrenal glands negative. Urinary bladder unremarkable. Stomach/Bowel: Stomach is within normal limits. Appendix appears normal. No evidence of bowel wall thickening, distention, or inflammatory changes. Lymphatic: No significant vascular findings are present. No enlarged abdominal or pelvic lymph nodes. Reproductive: Uterus and bilateral adnexa are unremarkable. Other: No  ascites.  No free air. Musculoskeletal: No acute or significant osseous findings. IMPRESSION: VASCULAR 1. Occlusion of the dominant superior right renal artery, which was previously stented, with infarction of much of the right renal parenchyma. 2. Patchy areas of partially perfused parenchyma in the upper pole right kidney presumably from collateral circulation from the diminutive inferior right renal artery. These may represent a source of renovascular hypertension. NON-VASCULAR 1. No acute findings Electronically Signed   By: Lucrezia Europe M.D.   On: 03/23/2017 17:56      Assessment & Plan: Pt is a 56 y.o. caucasian female with right renal artery aneurysm status post coil and stent placement in Jan 2018 with subsequent complication of stent occlusion and renal infarction, was admitted on 03/23/2017 with Accelerated HTN and rt renal infarction  Recently, last few days, patient had experienced accelerated  hypertension with complications of syncope, retinal hemorrhages, headaches  She underwent cause embolization of the residual part of the kidney on March 28  Her blood pressure is better controlled now She is scheduled to be discharged today with amlodipine and lisinopril We will schedule followup the patient in about one to 2 weeks in our office for further care Her serum creatinine has increased slightly from baseline of 0.89-1.01  Followup as outpatient

## 2017-03-25 NOTE — Telephone Encounter (Signed)
Pt is being discharged from [facility] today for Renal Infarct.  I have scheduled a hospital follow up appointment/MW

## 2017-03-25 NOTE — Progress Notes (Signed)
Pt discharged to home via wc.  Instructions and rx given to pt.  Questions answered.  No distress.  

## 2017-03-25 NOTE — Discharge Instructions (Signed)
Check your Blood pressure twice daiy before taking blood pressure medicine, if the " systolic ( Higher number) is < 110- DON't take that dose of medicine.

## 2017-03-26 ENCOUNTER — Encounter: Payer: Self-pay | Admitting: Vascular Surgery

## 2017-03-27 LAB — CSF CULTURE: Culture: NO GROWTH

## 2017-03-27 LAB — CSF CULTURE W GRAM STAIN

## 2017-03-29 ENCOUNTER — Encounter: Payer: Self-pay | Admitting: Physician Assistant

## 2017-03-29 ENCOUNTER — Ambulatory Visit: Payer: BLUE CROSS/BLUE SHIELD | Admitting: Physician Assistant

## 2017-03-29 ENCOUNTER — Ambulatory Visit (INDEPENDENT_AMBULATORY_CARE_PROVIDER_SITE_OTHER): Payer: BLUE CROSS/BLUE SHIELD | Admitting: Physician Assistant

## 2017-03-29 VITALS — BP 130/88 | HR 84 | Temp 98.4°F | Resp 16 | Wt 136.8 lb

## 2017-03-29 DIAGNOSIS — H93A1 Pulsatile tinnitus, right ear: Secondary | ICD-10-CM

## 2017-03-29 DIAGNOSIS — N280 Ischemia and infarction of kidney: Secondary | ICD-10-CM

## 2017-03-29 DIAGNOSIS — I722 Aneurysm of renal artery: Secondary | ICD-10-CM

## 2017-03-29 DIAGNOSIS — I119 Hypertensive heart disease without heart failure: Secondary | ICD-10-CM | POA: Diagnosis not present

## 2017-03-29 DIAGNOSIS — Z9189 Other specified personal risk factors, not elsewhere classified: Secondary | ICD-10-CM | POA: Diagnosis not present

## 2017-03-29 DIAGNOSIS — IMO0001 Reserved for inherently not codable concepts without codable children: Secondary | ICD-10-CM

## 2017-03-29 MED ORDER — AMLODIPINE BESYLATE 5 MG PO TABS: 5.0000 mg | ORAL_TABLET | Freq: Every day | ORAL | 1 refills | Status: DC

## 2017-03-29 MED ORDER — LISINOPRIL 20 MG PO TABS: 20.0000 mg | ORAL_TABLET | Freq: Every day | ORAL | 1 refills | Status: DC

## 2017-03-29 NOTE — Progress Notes (Signed)
[name] Female    DOB: 11/15/1961   56 y.o.   MRN: [id number] Visit Date: 03/29/2017  Today's Provider: [name], PA-C   Chief Complaint  Patient presents with  . Hospitalization Follow-up   Subjective:    HPI  Follow up Hospitalization  Patient was admitted to [facility] on 03/23/17 and discharged on 03/25/17 for hypertensive emergency and right renal infarct. She was treated for Malignant HTN with heart disease with out CHF, with out Chronic Kidney Disease. Acute renal artery occlusion, Hypertensive urgency. Treatment for this included Renal Angiography and Lisinopril was added, amlodipine was decreased to 5mg . She was prescribed Hydrocodone-Acetaminophen but patient not taking it. Telephone follow up was done on 02/2917-Transition of Care. She reports good compliance with treatment. She reports this condition is Improved. She reports that she does get lightheaded, weak, fatigue and sick to her stomach after going out and walking or pushing herself. She was able to rest all day yesterday and is feeling better.  She is checking her BP at home 2 times a day. She reports that her BP this A.M was 132/86.  She had a right renal stent and coiling of accessory arteries that for aneurysms on 01/18/17. She came to the office last week with worsening hypertension. She had a fall/syncopal event while in the shower on 03/23/17 and was sent to the ER. She was found to have clot formation in the stent of the right renal artery and subsequent renal infarct, with a small inferior artery supplying the lower 10% of the right kidney feeding the renovascular hypertensive emergency. It was felt best to coil that remaining artery and shut down blood supply  to the right kidney. This was done on Weds 03/24/17. BP started to improve slightly and patient was discharged home on 03/25/17. She has been compliant with amlodipine and lisinopril with having only one day (03/27/17) when her BP  became elevated. She did go visit her grandson that was just born on 03/24/16 that day and may have pushed herself to much. BP has been running more stable in the 120-130s/60-80s since Sunday.  ------------------------------------------------------------------------------------    Allergies  Allergen Reactions  . Bee Venom Anaphylaxis  . Ivp Dye [Iodinated Diagnostic Agents] Rash     Current Outpatient Prescriptions:  .  amLODipine (NORVASC) 5 MG tablet, Take 1 tablet (5 mg total) by mouth daily. In evening., Disp: 30 tablet, Rfl: 1 .  aspirin EC 81 MG EC tablet, Take 1 tablet (81 mg total) by mouth daily., Disp: 90 tablet, Rfl: 3 .  Calcium Carbonate-Vit D-Min (CALCIUM 1200) 1200-1000 MG-UNIT CHEW, Chew 1 each by mouth daily., Disp: , Rfl:  .  lisinopril (PRINIVIL,ZESTRIL) 20 MG tablet, Take 1 tablet (20 mg total) by mouth daily., Disp: 30 tablet, Rfl: 0 .  Multiple Vitamins-Minerals (MULTIVITAMIN WITH MINERALS) tablet, Take 1 tablet by mouth daily., Disp: , Rfl:  .  Omega-3 Fatty Acids (FISH OIL) 1000 MG CAPS, Take 1 capsule by mouth daily., Disp: , Rfl:  .  vitamin B-12 (CYANOCOBALAMIN) 1000 MCG tablet, Take 1,000 mcg by mouth daily., Disp: , Rfl:  .  HYDROcodone-acetaminophen (NORCO/VICODIN) 5-325 MG tablet, Take 1 tablet by mouth every 6 (six) hours as needed for moderate pain or severe pain. (Patient not taking: Reported on 03/25/2017), Disp: 10 tablet, Rfl: 0  Review of Systems  Constitutional: Positive for fatigue.  HENT: Positive for tinnitus (still having pulsatile tinnitus; not as frequent; occurs when she lies  down; hears on the right side).   Respiratory: Negative.   Cardiovascular: Positive for palpitations (She is getting more palpitations). Negative for chest pain and leg swelling.  Neurological: Positive for weakness and light-headedness. Negative for dizziness and headaches.    Social History  Substance Use Topics  . Smoking status: Former Smoker    Years: 5.00  .  Smokeless tobacco: Never Used     Comment: Smoked for about 5 years, smoked about 1 pack per day. Quit in 1984  . Alcohol use Yes     Comment: Occasional alcohol use; Drinks 2-4 glasses of wine per week.   Objective:   BP 130/88 (BP Location: Right Arm, Patient Position: Sitting, Cuff Size: Normal)   Pulse 84   Temp 98.4 F (36.9 C) (Oral)   Resp 16   Wt 136 lb 12.8 oz (62.1 kg)   SpO2 98%   BMI 24.23 kg/m    Physical Exam  Constitutional: She appears well-developed and well-nourished. No distress.  Neck: Normal range of motion. Neck supple. No JVD present. Carotid bruit is not present. No tracheal deviation present. No thyromegaly present.  Cardiovascular: Normal rate, regular rhythm and normal heart sounds.  Exam reveals no gallop and no friction rub.   No murmur heard. Pulmonary/Chest: Effort normal and breath sounds normal. No respiratory distress. She has no wheezes. She has no rales.  Musculoskeletal: She exhibits no edema.  Lymphadenopathy:    She has no cervical adenopathy.  Skin: She is not diaphoretic.  Vitals reviewed.     Assessment & Plan:     1. Transition of care performed with sharing of clinical summary Hospitalization and procedure charts reviewed.   2. Renal artery aneurysm of native kidney ([place]) Stent placed right main renal arteryand 2 coils placed in 2 branch arteries off the right main renal artery. Thrombosed stent led to malignant renovascular hypertension. Coiled remaining branch to shut off blood supply to the right kidney to hopefully control renovascular hypertension. BP has been improving slowly. Lab slip given to patient to have labs rechecked next week. She is to follow up with Dr. Candiss Norse, nephrology, in the next 2 weeks. She follows up with Dr. Lucky Cowboy on 04/27/17. She is to continue checking her BP and call if readings steadily increase. I will see her back in 2 weeks.   3. Malignant HTN with heart disease, w/o CHF, w/o chronic kidney disease See  above medical treatment plan. - lisinopril (PRINIVIL,ZESTRIL) 20 MG tablet; Take 1 tablet (20 mg total) by mouth daily.  Dispense: 30 tablet; Refill: 1 - amLODipine (NORVASC) 5 MG tablet; Take 1 tablet (5 mg total) by mouth daily. In evening.  Dispense: 30 tablet; Refill: 1 - CBC w/Diff/Platelet - Comprehensive Metabolic Panel (CMET)  4. Acute renal artery occlusion ([facility]) - CBC w/Diff/Platelet - Comprehensive Metabolic Panel (CMET)  5. Renal infarction ([place]) - CBC w/Diff/Platelet - Comprehensive Metabolic Panel (CMET)  6. Pulsatile tinnitus of right ear Still having pulsatile tinnitus in the right ear which she states started shortly after having the first renal angiogram and stent placement. It has lessened in frequency but still occurring. No carotid bruit heard on exam. Advised her to let Dr. Lucky Cowboy know at follow up if still occurring to see if she needs a carotid duplex. She voices understanding.        [name], PA-C  [facility]

## 2017-03-29 NOTE — Patient Instructions (Signed)
Renovascular Hypertension Renovascular hypertension is high blood pressure that happens when the arteries that carry blood to the kidneys (renal arteries) become narrow. Blood pressure is a measurement of how strongly your blood is pressing against the walls of your arteries. Hypertension, or high blood pressure, is a long-term condition in which blood pressure is higher than normal. Untreated hypertension, including renovascular hypertension, can lead to various health problems, such as:  Heart failure.  Heart disease.  Stroke.  Kidney failure.  Blood vessel damage.  Blindness or vision problems.  What are the causes? This condition occurs when one or both of the renal arteries become narrow. This narrowing reduces blood flow to the kidneys, causing the kidneys to sense that blood pressure is low. As a result, the kidneys make an enzyme called renin that causes an increase in blood pressure. Many conditions can cause the renal arteries to become narrow and lead to renovascular hypertension. Some of these are:  Atherosclerosis. This is a hardening of the renal arteries. It causes plaque to build up and block the renal arteries.  Fibromuscular dysplasia. This is a condition in which cells of the artery wall overgrow, causing a narrowing of the renal arteries. It is a common cause of renovascular hypertension in younger women.  A blockage in the renal artery due to injury, tumors, or blood clots (rare).  What increases the risk? You are more likely to develop this condition if:  You are a woman who is younger than age 30.  You are a man who is older than age 50.  You have a history of heart problems or strokes.  What are the signs or symptoms? In many cases, there are no symptoms. If symptoms are present, they may include:  Sudden high blood pressure that gets worse in older people who previously had well-controlled blood pressure.  Nausea and vomiting.  Vision  problems.  Chest pain.  How is this diagnosed? This condition may be diagnosed based on:  A physical exam and blood pressure check. During the exam, your health care provider may use a stethoscope to listen for a "whooshing" noise (bruit) over the abdomen or on either side between the ribs and the hip (flank area).  Blood tests to measure renin and to check your hormone levels, including a hormone called aldosterone. Aldosterone controls the salt and water balance in your body.  Imaging tests. These may include: ? An ultrasound. This test uses sound waves to produce an image of the inside of your body. ? Renal angiogram. For this test, a dye is injected into a kidney artery to show narrowing of the artery on an X-ray. ? MRI of the arteries that supply the kidneys.  How is this treated? This condition may be treated with:  Medicines. These may be given to help you control your blood pressure.  Treatments or lifestyle changes to address factors or conditions that may be contributing to high blood pressure. This may mean lowering your cholesterol, eating a heart-healthy diet, exercising, and maintaining an ideal body weight.  Surgery to remove a blockage. This may be necessary if a renal artery is blocked badly.  Percutaneous transluminal renal angioplasty (PTRA). This is a procedure to open narrow renal arteries if they are not completely blocked. Sometimes a stent is placed in the artery to prevent the artery from becoming blocked again.  Follow these instructions at home: Monitoring your blood pressure  Monitor your blood pressure at home as told by your health care provider. ?   A blood pressure reading is recorded as two numbers, such as 120 over 80 (or 120/80). The first ("top") number is called the systolic pressure. It is a measure of the pressure in your arteries when your heart beats. The second ("bottom") number is called the diastolic pressure. It is a measure of the pressure in  your arteries as your heart relaxes between beats. A normal blood pressure reading is:  Systolic: below 120.  Diastolic: below 80. ? Your personal target blood pressure may vary depending on your medical conditions, your age, and other factors.  Have your blood pressure rechecked as told by your health care provider. Lifestyle  Work with your health care provider to maintain a healthy body weight or to lose weight. Ask what an ideal weight is for you.  Exercise regularly. Get at least 30-45 minutes of aerobic exercise, at least 5 times a week.  Do not use any products that contain nicotine or tobacco, such as cigarettes and e-cigarettes. If you need help quitting, ask your health care provider. Eating and drinking  Eat a heart-healthy diet. This may include: ? Following the DASH diet. This diet is high in fruits, vegetables, and whole grains. It is low in salt (sodium), saturated fat, and added sugars. ? Keeping your sodium intake below 1,500 mg per day. Do not add salt to your food. Check food labels to see how much sodium is in a food or beverage.  Limit alcohol intake to no more than 1 drink a day for nonpregnant women and 2 drinks a day for men. One drink equals 12 oz of beer, 5 oz of wine, or 1 oz of hard liquor.  Try to limit caffeine. Caffeine may make your renovascular hypertension worse. Check ingredients and nutrition facts to see if a food or beverage contains caffeine. General instructions  Take over-the-counter and prescription medicines only as told by your health care provider.  Keep all follow-up visits as told by your health care provider. This is important. Contact a health care provider if:  Your symptoms continue to get worse and medicine does not help.  You have a fever. Get help right away if:  You develop shortness of breath.  You develop numbness on one side.  You develop areas of muscle weakness.  You are unable to speak.  You feel light-headed or  you pass out.  You have sudden spikes of high blood pressure.  You have symptoms of very high blood pressure. Summary  Renovascular hypertension is high blood pressure that happens when the arteries that carry blood to the kidneys (renal arteries) become narrow.  In many cases, there are no symptoms for this condition.  There are several treatments for renovascular hypertension, including medicines, lifestyle changes, surgery to remove a blockage, or a procedure to widen a narrow artery.  Monitor your blood pressure at home as told by your health care provider. This information is not intended to replace advice given to you by your health care provider. Make sure you discuss any questions you have with your health care provider. Document Released: 11/27/2005 Document Revised: 11/18/2016 Document Reviewed: 11/18/2016 Elsevier Interactive Patient Education  2017 [employer]  

## 2017-03-30 ENCOUNTER — Encounter (INDEPENDENT_AMBULATORY_CARE_PROVIDER_SITE_OTHER): Payer: BLUE CROSS/BLUE SHIELD | Admitting: Vascular Surgery

## 2017-03-31 DIAGNOSIS — I119 Hypertensive heart disease without heart failure: Secondary | ICD-10-CM | POA: Diagnosis not present

## 2017-03-31 DIAGNOSIS — N280 Ischemia and infarction of kidney: Secondary | ICD-10-CM | POA: Diagnosis not present

## 2017-03-31 DIAGNOSIS — L719 Rosacea, unspecified: Secondary | ICD-10-CM | POA: Diagnosis not present

## 2017-03-31 DIAGNOSIS — L821 Other seborrheic keratosis: Secondary | ICD-10-CM | POA: Diagnosis not present

## 2017-03-31 DIAGNOSIS — L570 Actinic keratosis: Secondary | ICD-10-CM | POA: Diagnosis not present

## 2017-03-31 DIAGNOSIS — L578 Other skin changes due to chronic exposure to nonionizing radiation: Secondary | ICD-10-CM | POA: Diagnosis not present

## 2017-04-01 ENCOUNTER — Inpatient Hospital Stay: Payer: BLUE CROSS/BLUE SHIELD | Admitting: Physician Assistant

## 2017-04-01 LAB — COMPREHENSIVE METABOLIC PANEL
ALT: 23 IU/L (ref 0–32)
AST: 18 IU/L (ref 0–40)
Albumin/Globulin Ratio: 1.5 (ref 1.2–2.2)
Albumin: 4 g/dL (ref 3.5–5.5)
Alkaline Phosphatase: 79 IU/L (ref 39–117)
BUN/Creatinine Ratio: 15 (ref 9–23)
BUN: 15 mg/dL (ref 6–24)
Bilirubin Total: <0.2 mg/dL (ref 0.0–1.2)
CO2: 25 mmol/L (ref 18–29)
Calcium: 9.6 mg/dL (ref 8.7–10.2)
Chloride: 99 mmol/L (ref 96–106)
Creatinine, Ser: 1 mg/dL (ref 0.57–1.00)
GFR calc Af Amer: 74 mL/min/{1.73_m2} (ref >59)
GFR calc non Af Amer: 64 mL/min/{1.73_m2} (ref >59)
Globulin, Total: 2.6 g/dL (ref 1.5–4.5)
Glucose: 107 mg/dL — ABNORMAL HIGH (ref 65–99)
Potassium: 5.2 mmol/L (ref 3.5–5.2)
Sodium: 139 mmol/L (ref 134–144)
Total Protein: 6.6 g/dL (ref 6.0–8.5)

## 2017-04-01 LAB — CBC WITH DIFFERENTIAL/PLATELET
Basophils Absolute: 0.1 10*3/uL (ref 0.0–0.2)
Basos: 1 %
EOS (ABSOLUTE): 0.1 10*3/uL (ref 0.0–0.4)
Eos: 2 %
Hematocrit: 42 % (ref 34.0–46.6)
Hemoglobin: 13.9 g/dL (ref 11.1–15.9)
Immature Grans (Abs): 0.1 10*3/uL (ref 0.0–0.1)
Immature Granulocytes: 1 %
Lymphocytes Absolute: 1.3 10*3/uL (ref 0.7–3.1)
Lymphs: 20 %
MCH: 29.4 pg (ref 26.6–33.0)
MCHC: 33.1 g/dL (ref 31.5–35.7)
MCV: 89 fL (ref 79–97)
Monocytes Absolute: 0.5 10*3/uL (ref 0.1–0.9)
Monocytes: 8 %
Neutrophils Absolute: 4.3 10*3/uL (ref 1.4–7.0)
Neutrophils: 68 %
Platelets: 403 10*3/uL — ABNORMAL HIGH (ref 150–379)
RBC: 4.72 x10E6/uL (ref 3.77–5.28)
RDW: 13.2 % (ref 12.3–15.4)
WBC: 6.3 10*3/uL (ref 3.4–10.8)

## 2017-04-02 ENCOUNTER — Telehealth: Payer: Self-pay | Admitting: Emergency Medicine

## 2017-04-02 DIAGNOSIS — I119 Hypertensive heart disease without heart failure: Secondary | ICD-10-CM

## 2017-04-02 MED ORDER — AMLODIPINE BESYLATE 10 MG PO TABS: 10.0000 mg | ORAL_TABLET | Freq: Every day | ORAL | 5 refills | Status: DC

## 2017-04-02 MED ORDER — LISINOPRIL 20 MG PO TABS: 20.0000 mg | ORAL_TABLET | Freq: Every day | ORAL | 5 refills | Status: DC

## 2017-04-02 NOTE — Telephone Encounter (Signed)
Ok sent in lisinopril 20mg  and amlodipine 10mg  to [place]

## 2017-04-02 NOTE — Telephone Encounter (Signed)
Pt reports she is currently taking the Amlodipine 10 mg po qd and lisinopril 20 mg po qd. BP has been reading 100's/120's/80's (DBP has gotten as high as 87). Renaldo Fiddler, CMA

## 2017-04-02 NOTE — Telephone Encounter (Signed)
Pt called stating that she was taking Amlodipine 10 mg but Amlodipine 5 mg was sent in. Did you want to decrease this or was this meant to be 10 mg? Also she was suppose to get a refill on her lisinopril as well but did not get to the pharmacy. Please advise.  Thanks   [facility] [address].

## 2017-04-02 NOTE — Telephone Encounter (Signed)
Pt advised. Inge Waldroup Drozdowski, CMA  

## 2017-04-02 NOTE — Telephone Encounter (Signed)
At the hospital they had cut her dose in half when she was discharged and had the lisinopril added. Can someone see how her BP is running at home currently. If she is taking the 10mg  and the lisinopril and BP still high then I will refill the 10mg . If it is running lower may have her take the 5mg  and then I will also send in the lisinopril

## 2017-04-05 DIAGNOSIS — I10 Essential (primary) hypertension: Secondary | ICD-10-CM | POA: Diagnosis not present

## 2017-04-05 DIAGNOSIS — N280 Ischemia and infarction of kidney: Secondary | ICD-10-CM | POA: Diagnosis not present

## 2017-04-23 ENCOUNTER — Encounter (INDEPENDENT_AMBULATORY_CARE_PROVIDER_SITE_OTHER): Payer: BLUE CROSS/BLUE SHIELD | Admitting: Vascular Surgery

## 2017-04-23 ENCOUNTER — Encounter (INDEPENDENT_AMBULATORY_CARE_PROVIDER_SITE_OTHER): Payer: BLUE CROSS/BLUE SHIELD

## 2017-04-27 ENCOUNTER — Ambulatory Visit (INDEPENDENT_AMBULATORY_CARE_PROVIDER_SITE_OTHER): Payer: BLUE CROSS/BLUE SHIELD | Admitting: Vascular Surgery

## 2017-04-27 ENCOUNTER — Encounter (INDEPENDENT_AMBULATORY_CARE_PROVIDER_SITE_OTHER): Payer: Self-pay | Admitting: Vascular Surgery

## 2017-04-27 VITALS — BP 114/75 | HR 69 | Resp 17 | Wt 137.0 lb

## 2017-04-27 DIAGNOSIS — I160 Hypertensive urgency: Secondary | ICD-10-CM

## 2017-04-27 DIAGNOSIS — H93A9 Pulsatile tinnitus, unspecified ear: Secondary | ICD-10-CM

## 2017-04-27 DIAGNOSIS — N280 Ischemia and infarction of kidney: Secondary | ICD-10-CM | POA: Diagnosis not present

## 2017-04-27 DIAGNOSIS — I722 Aneurysm of renal artery: Secondary | ICD-10-CM | POA: Diagnosis not present

## 2017-04-27 NOTE — Assessment & Plan Note (Signed)
Thrombosis of previous interventions for extensive aneurysmal disease.

## 2017-04-27 NOTE — Progress Notes (Signed)
MRN : [id number]  Natalie Dickerson is a 56 y.o. (10/13/1961) female who presents with chief complaint of  Chief Complaint  Patient presents with  . Follow-up  .  History of Present Illness: Patient returns today in follow up of Renal artery issues. She isn't complaining of a lot of neck pain with occasional pulsatile With this as well. This is actually improving after her blood pressure has gotten under better control. She underwent embolization of her right renal artery branches for severe blood pressure several weeks ago. This has resulted in a marked improvement in her blood pressure control. She brings along today and over the past 2 weeks most of her blood pressure readings are normal. She still has a GFR of greater than 60 on her last check and seasonal nephrologist. She has resumed normal activity and has gone back to work.       Past Medical History:  Diagnosis Date  . Cancer ([place])    skin ca         Past Surgical History:  Procedure Laterality Date  . FINGER SURGERY           Family History  Problem Relation Age of Onset  . Hypertension Mother   . Breast cancer Mother   . Cancer Mother   . Breast cancer Father   No bleeding disorders or clotting disorders  Social History        Social History   Substance Use Topics   . Smoking status: Former Research scientist (life sciences)   . Smokeless tobacco: Never Used      Comment: Smoked for about 5 years, smoked about 1 pack per day. Quit in 1984   . Alcohol use Yes     Comment: Occasional alcohol use; Drinks 2-4 glasses of wine per week.   No IVDU  No Known Allergies        Current Outpatient Prescriptions  Medication Sig Dispense Refill  . baclofen (LIORESAL) 10 MG tablet Take 0.5-1 tablets (5-10 mg total) by mouth 3 (three) times daily. 30 each 1  . calcium carbonate 1250 MG capsule Take 1,250 mg by mouth daily.    . Cholecalciferol (VITAMIN D-3) 1000 UNITS CAPS Take 1 capsule by mouth daily.    .  Cyanocobalamin (VITAMIN B12 PO) Take by mouth daily.    Randell Loop Prim-Borage (FLAX OIL XTRA) CAPS Take by mouth.    Marland Kitchen ibuprofen (ADVIL,MOTRIN) 600 MG tablet Take 1 tablet (600 mg total) by mouth every 8 (eight) hours as needed. 90 tablet 1  . Multiple Vitamin tablet Take by mouth.    . Omega-3 Fatty Acids (FISH OIL) 1200 MG CPDR Take by mouth daily.    Marland Kitchen gabapentin (NEURONTIN) 100 MG capsule Take 1 capsule (100 mg total) by mouth 3 (three) times daily. (Patient not taking: Reported on 01/05/2017) 30 capsule 0   No current facility-administered medications for this visit.       REVIEW OF SYSTEMS (Negative unless checked)  Constitutional: _0 Weight loss  _1 Fever  _2 Chills Cardiac: _3 Chest pain   _4 Chest pressure   _5 Palpitations   _6 Shortness of breath when laying flat   _7 Shortness of breath at rest   _8 Shortness of breath with exertion. Vascular:  _9 Pain in legs with walking   _10 Pain in legs at rest   _11 Pain in legs when laying flat   _12 Claudication   _13 Pain in feet when walking  _14 Pain in feet at rest  _15 Pain in feet when laying flat   _16 History of DVT   _17   Phlebitis   _0 Swelling in legs   _1 Varicose veins   _2 Non-healing ulcers Pulmonary:   _3 Uses home oxygen   _4 Productive cough   _5 Hemoptysis   _6 Wheeze  _7 COPD   _8 Asthma Neurologic:  _9 Dizziness  _10 Blackouts   _11 Seizures   _12 History of stroke   _13 History of TIA  _14 Aphasia   _15 Temporary blindness   _16 Dysphagia   _17 Weakness or numbness in arms   _18 Weakness or numbness in legs Musculoskeletal:  _19 Arthritis   _20 Joint swelling   _21 Joint pain   _22 Low back pain Hematologic:  _23 Easy bruising  _24 Easy bleeding   _25 Hypercoagulable state   _26 Anemic  _27 Hepatitis Gastrointestinal:  _28 Blood in stool   _29 Vomiting blood  _30 Gastroesophageal reflux/heartburn   _31 Abdominal pain Genitourinary:  _32 Chronic kidney disease   _33 Difficult urination  _34 Frequent urination  _35 Burning with urination   _36 Hematuria Skin:  _37 Rashes   _38 Ulcers    _39 Wounds Psychological:  _40 History of anxiety   _41  History of major depression.     Physical Examination  BP 114/75   Pulse 69   Resp 17   Wt 137 lb (62.1 kg)   BMI 24.27 kg/m  Gen:  WD/WN, NAD Head: [place]/AT, No temporalis wasting. Ear/Nose/Throat: Hearing grossly intact, nares w/o erythema or drainage, trachea midline Eyes: Conjunctiva clear. Sclera non-icteric Neck: Supple.  No JVD.  Pulmonary:  Good air movement, no use of accessory muscles.  Cardiac: RRR Vascular: access site well healed Vessel Right Left  Radial Palpable Palpable                                   Gastrointestinal: soft, non-tender/non-distended.  Musculoskeletal: M/S 5/5 throughout.  No deformity or atrophy.  Neurologic: Sensation grossly intact in extremities.  Symmetrical.  Speech is fluent.  Psychiatric: Judgment intact, Mood & affect appropriate for pt's clinical situation. Dermatologic: No rashes or ulcers noted.  No cellulitis or open wounds.      Labs Recent Results (from the past 2160 hour(s))  Basic metabolic panel     Status: Abnormal   Collection Time: 03/23/17  9:16 AM  Result Value Ref Range   Sodium 139 135 - 145 mmol/L   Potassium 3.1 (L) 3.5 - 5.1 mmol/L   Chloride 104 101 - 111 mmol/L   CO2 25 22 - 32 mmol/L   Glucose, Bld 122 (H) 65 - 99 mg/dL   BUN 18 6 - 20 mg/dL   Creatinine, Ser 0.92 0.44 - 1.00 mg/dL   Calcium 9.4 8.9 - 10.3 mg/dL   GFR calc non Af Amer >60 >60 mL/min   GFR calc Af Amer >60 >60 mL/min    Comment: (NOTE) The eGFR has been calculated using the CKD EPI equation. This calculation has not been validated in all clinical situations. eGFR's persistently <60 mL/min signify possible Chronic Kidney Disease.    Anion gap 10 5 - 15  CBC     Status: Abnormal   Collection Time: 03/23/17  9:16 AM  Result Value Ref Range   WBC 12.8 (H) 3.6 - 11.0 K/uL   RBC 5.48 (H) 3.80 - 5.20 MIL/uL   Hemoglobin 16.2 (H) 12.0 - 16.0 g/dL   HCT 48.0 (H) 35.0 - 47.0 %    MCV 87.6 80.0 - 100.0 fL   MCH 29.5 26.0 - 34.0 pg   MCHC 33.7 32.0 - 36.0 g/dL   RDW 13.4 11.5 - 14.5 %   Platelets 343 150 - 440 K/uL  Urinalysis, Complete w Microscopic     Status: Abnormal   Collection Time: 03/23/17  9:17 AM  Result Value Ref Range   Color, Urine YELLOW (A) YELLOW   APPearance CLOUDY (A) CLEAR   Specific Gravity, Urine 1.016 1.005 - 1.030   pH 7.0 5.0 - 8.0   Glucose, UA 50 (A) NEGATIVE mg/dL   Hgb urine dipstick NEGATIVE NEGATIVE   Bilirubin Urine NEGATIVE NEGATIVE   Ketones, ur NEGATIVE NEGATIVE mg/dL   Protein, ur 100 (A) NEGATIVE mg/dL   Nitrite NEGATIVE NEGATIVE   Leukocytes, UA NEGATIVE NEGATIVE   RBC / HPF 0-5 0 - 5 RBC/hpf   WBC, UA 6-30 0 - 5 WBC/hpf   Bacteria, UA NONE SEEN NONE SEEN   Squamous Epithelial / LPF NONE SEEN NONE SEEN   Mucous PRESENT    Amorphous Crystal PRESENT   Protime-INR     Status: None   Collection Time: 03/23/17 10:32 AM  Result Value Ref Range   Prothrombin Time 12.1 11.4 - 15.2 seconds   INR 0.90   APTT     Status: None   Collection Time: 03/23/17 10:32 AM  Result Value Ref Range   aPTT 27 24 - 36 seconds  CSF cell count with differential collection tube #: 1     Status: Abnormal   Collection Time: 03/23/17 12:22 PM  Result Value Ref Range   Tube # 1    Color, CSF CLEAR (A) COLORLESS   Appearance, CSF COLORLESS (A) CLEAR   Supernatant CLEAR    RBC Count, CSF 7 (H) 0 - 3 /cu mm   WBC, CSF 0 0 - 5 /cu mm   Segmented Neutrophils-CSF 0 %   Lymphs, CSF 0 %   Monocyte-Macrophage-Spinal Fluid 0 %   Eosinophils, CSF 0 %   Other Cells, CSF 0   CSF cell count with differential     Status: Abnormal   Collection Time: 03/23/17 12:22 PM  Result Value Ref Range   Tube # 4    Color, CSF COLORLESS COLORLESS   Appearance, CSF CLEAR (A) CLEAR   Supernatant COLORLESS    RBC Count, CSF 0 0 - 3 /cu mm   WBC, CSF 0 0 - 5 /cu mm  CSF culture     Status: None   Collection Time: 03/23/17 12:22 PM  Result Value Ref Range    Specimen Description CSF    Special Requests NONE    Gram Stain CYTOSPIN SLIDE RARE WBC SEEN NO ORGANISMS SEEN     Culture      NO GROWTH 3 DAYS Performed at [facility], [place] [address], [place], [place] [address]    Report Status 03/27/2017 FINAL   Protein and glucose, CSF     Status: None   Collection Time: 03/23/17 12:22 PM  Result Value Ref Range   Glucose, CSF 59 40 - 70 mg/dL   Total  Protein, CSF 34 15 - 45 mg/dL  CBC     Status: None   Collection Time: 03/23/17  7:09 PM  Result Value Ref Range   WBC 7.8 3.6 - 11.0 K/uL   RBC 4.72 3.80 - 5.20 MIL/uL   Hemoglobin 14.2 12.0 - 16.0 g/dL   HCT 42.1 35.0 - 47.0 %   MCV 89.2 80.0 - 100.0 fL   MCH 30.0 26.0 - 34.0 pg   MCHC 33.7 32.0 - 36.0 g/dL   RDW 13.4 11.5 - 14.5 %   Platelets 296 150 - 440  K/uL  Creatinine, serum     Status: None   Collection Time: 03/23/17  7:09 PM  Result Value Ref Range   Creatinine, Ser 0.89 0.44 - 1.00 mg/dL   GFR calc non Af Amer >60 >60 mL/min   GFR calc Af Amer >60 >60 mL/min    Comment: (NOTE) The eGFR has been calculated using the CKD EPI equation. This calculation has not been validated in all clinical situations. eGFR's persistently <60 mL/min signify possible Chronic Kidney Disease.   HIV antibody (Routine Testing)     Status: None   Collection Time: 03/23/17  7:09 PM  Result Value Ref Range   HIV Screen 4th Generation wRfx Non Reactive Non Reactive    Comment: (NOTE) Performed At: [facility] [place], [place] [id number] Lindon Romp MD [id number]   Basic metabolic panel     Status: Abnormal   Collection Time: 03/24/17  4:29 AM  Result Value Ref Range   Sodium 140 135 - 145 mmol/L   Potassium 4.0 3.5 - 5.1 mmol/L   Chloride 110 101 - 111 mmol/L   CO2 23 22 - 32 mmol/L   Glucose, Bld 125 (H) 65 - 99 mg/dL   BUN 24 (H) 6 - 20 mg/dL   Creatinine, Ser 1.01 (H) 0.44 - 1.00 mg/dL   Calcium 8.9 8.9 - 10.3 mg/dL   GFR calc non Af Amer >60 >60  mL/min   GFR calc Af Amer >60 >60 mL/min    Comment: (NOTE) The eGFR has been calculated using the CKD EPI equation. This calculation has not been validated in all clinical situations. eGFR's persistently <60 mL/min signify possible Chronic Kidney Disease.    Anion gap 7 5 - 15  CBC     Status: Abnormal   Collection Time: 03/24/17  4:29 AM  Result Value Ref Range   WBC 13.0 (H) 3.6 - 11.0 K/uL   RBC 4.39 3.80 - 5.20 MIL/uL   Hemoglobin 13.2 12.0 - 16.0 g/dL   HCT 39.6 35.0 - 47.0 %   MCV 90.2 80.0 - 100.0 fL   MCH 30.1 26.0 - 34.0 pg   MCHC 33.4 32.0 - 36.0 g/dL   RDW 13.3 11.5 - 14.5 %   Platelets 265 150 - 440 K/uL  Glucose, capillary     Status: Abnormal   Collection Time: 03/24/17  8:12 AM  Result Value Ref Range   Glucose-Capillary 105 (H) 65 - 99 mg/dL  Glucose, capillary     Status: Abnormal   Collection Time: 03/25/17  7:31 AM  Result Value Ref Range   Glucose-Capillary 103 (H) 65 - 99 mg/dL  CBC w/Diff/Platelet     Status: Abnormal   Collection Time: 03/31/17 10:33 AM  Result Value Ref Range   WBC 6.3 3.4 - 10.8 x10E3/uL   RBC 4.72 3.77 - 5.28 x10E6/uL   Hemoglobin 13.9 11.1 - 15.9 g/dL   Hematocrit 42.0 34.0 - 46.6 %   MCV 89 79 - 97 fL   MCH 29.4 26.6 - 33.0 pg   MCHC 33.1 31.5 - 35.7 g/dL   RDW 13.2 12.3 - 15.4 %   Platelets 403 (H) 150 - 379 x10E3/uL   Neutrophils 68 Not Estab. %   Lymphs 20 Not Estab. %   Monocytes 8 Not Estab. %   Eos 2 Not Estab. %   Basos 1 Not Estab. %   Neutrophils Absolute 4.3 1.4 - 7.0 x10E3/uL   Lymphocytes Absolute 1.3 0.7 - 3.1  x10E3/uL   Monocytes Absolute 0.5 0.1 - 0.9 x10E3/uL   EOS (ABSOLUTE) 0.1 0.0 - 0.4 x10E3/uL   Basophils Absolute 0.1 0.0 - 0.2 x10E3/uL   Immature Granulocytes 1 Not Estab. %   Immature Grans (Abs) 0.1 0.0 - 0.1 x10E3/uL  Comprehensive Metabolic Panel (CMET)     Status: Abnormal   Collection Time: 03/31/17 10:33 AM  Result Value Ref Range   Glucose 107 (H) 65 - 99 mg/dL   BUN 15 6 - 24 mg/dL    Creatinine, Ser 1.00 0.57 - 1.00 mg/dL   GFR calc non Af Amer 64 >59 mL/min/1.73   GFR calc Af Amer 74 >59 mL/min/1.73   BUN/Creatinine Ratio 15 9 - 23   Sodium 139 134 - 144 mmol/L   Potassium 5.2 3.5 - 5.2 mmol/L   Chloride 99 96 - 106 mmol/L   CO2 25 18 - 29 mmol/L   Calcium 9.6 8.7 - 10.2 mg/dL   Total Protein 6.6 6.0 - 8.5 g/dL   Albumin 4.0 3.5 - 5.5 g/dL   Globulin, Total 2.6 1.5 - 4.5 g/dL   Albumin/Globulin Ratio 1.5 1.2 - 2.2   Bilirubin Total <0.2 0.0 - 1.2 mg/dL   Alkaline Phosphatase 79 39 - 117 IU/L   AST 18 0 - 40 IU/L   ALT 23 0 - 32 IU/L    Radiology No results found.    Assessment/Plan  Hypertensive urgency Markedly improved after removing the remaining renal artery branches providing flow return dying kidney.  Acute renal artery occlusion (HCC) Thrombosis of previous interventions for extensive aneurysmal disease.  Pulsatile tinnitus Given her previous head and neck trauma I think checking a carotid duplex her next visit would be prudent.  Renal artery aneurysm of native kidney [facility]) Right renal artery has now been successfully embolized. Left renal artery made to be monitored and I will plan a duplex in 6 months for follow-up.    Leotis Pain, MD  04/27/2017 2:16 PM    This note was created with Dragon medical transcription system.  Any errors from dictation are purely unintentional

## 2017-04-27 NOTE — Assessment & Plan Note (Signed)
Given her previous head and neck trauma I think checking a carotid duplex her next visit would be prudent.

## 2017-04-27 NOTE — Assessment & Plan Note (Signed)
Markedly improved after removing the remaining renal artery branches providing flow return dying kidney.

## 2017-04-27 NOTE — Assessment & Plan Note (Signed)
Right renal artery has now been successfully embolized. Left renal artery made to be monitored and I will plan a duplex in 6 months for follow-up.

## 2017-05-27 ENCOUNTER — Ambulatory Visit (INDEPENDENT_AMBULATORY_CARE_PROVIDER_SITE_OTHER): Payer: BLUE CROSS/BLUE SHIELD | Admitting: Physician Assistant

## 2017-05-27 ENCOUNTER — Encounter: Payer: Self-pay | Admitting: Physician Assistant

## 2017-05-27 VITALS — BP 116/72 | HR 68 | Temp 97.3°F | Resp 16 | Wt 140.0 lb

## 2017-05-27 DIAGNOSIS — I150 Renovascular hypertension: Secondary | ICD-10-CM

## 2017-05-27 MED ORDER — LOSARTAN POTASSIUM 50 MG PO TABS: 50.0000 mg | ORAL_TABLET | Freq: Every day | ORAL | 5 refills | Status: DC

## 2017-05-27 NOTE — Patient Instructions (Signed)
Losartan tablets  What is this medicine?  LOSARTAN (loe SAR tan) is used to treat high blood pressure and to reduce the risk of stroke in certain patients. This drug also slows the progression of kidney disease in patients with diabetes.  This medicine may be used for other purposes; ask your health care provider or pharmacist if you have questions.  COMMON BRAND NAME(S): Cozaar  What should I tell my health care provider before I take this medicine?  They need to know if you have any of these conditions:  -heart failure  -kidney or liver disease  -an unusual or allergic reaction to losartan, other medicines, foods, dyes, or preservatives  -pregnant or trying to get pregnant  -breast-feeding  How should I use this medicine?  Take this medicine by mouth with a glass of water. Follow the directions on the prescription label. This medicine can be taken with or without food. Take your doses at regular intervals. Do not take your medicine more often than directed.  Talk to your pediatrician regarding the use of this medicine in children. Special care may be needed.  Overdosage: If you think you have taken too much of this medicine contact a poison control center or emergency room at once.  NOTE: This medicine is only for you. Do not share this medicine with others.  What if I miss a dose?  If you miss a dose, take it as soon as you can. If it is almost time for your next dose, take only that dose. Do not take double or extra doses.  What may interact with this medicine?  -blood pressure medicines  -diuretics, especially triamterene, spironolactone, or amiloride  -fluconazole  -NSAIDs, medicines for pain and inflammation, like ibuprofen or naproxen  -potassium salts or potassium supplements  -rifampin  This list may not describe all possible interactions. Give your health care provider a list of all the medicines, herbs, non-prescription drugs, or dietary supplements you use. Also tell them if you smoke, drink alcohol, or  use illegal drugs. Some items may interact with your medicine.  What should I watch for while using this medicine?  Visit your doctor or health care professional for regular checks on your progress. Check your blood pressure as directed. Ask your doctor or health care professional what your blood pressure should be and when you should contact him or her. Call your doctor or health care professional if you notice an irregular or fast heart beat.  Women should inform their doctor if they wish to become pregnant or think they might be pregnant. There is a potential for serious side effects to an unborn child, particularly in the second or third trimester. Talk to your health care professional or pharmacist for more information.  You may get drowsy or dizzy. Do not drive, use machinery, or do anything that needs mental alertness until you know how this drug affects you. Do not stand or sit up quickly, especially if you are an older patient. This reduces the risk of dizzy or fainting spells. Alcohol can make you more drowsy and dizzy. Avoid alcoholic drinks.  Avoid salt substitutes unless you are told otherwise by your doctor or health care professional.  Do not treat yourself for coughs, colds, or pain while you are taking this medicine without asking your doctor or health care professional for advice. Some ingredients may increase your blood pressure.  What side effects may I notice from receiving this medicine?  Side effects that you should report to   irregular heart beat, palpitations, or chest pain -skin rash, itching -swelling of your face, lips, tongue, hands, or feet Side effects that usually do not require medical attention (report to your doctor or health care  professional if they continue or are bothersome): -cough -decreased sexual function or desire -headache -nasal congestion or stuffiness -nausea or stomach pain -sore or cramping muscles This list may not describe all possible side effects. Call your doctor for medical advice about side effects. You may report side effects to FDA at 1-800-FDA-1088. Where should I keep my medicine? Keep out of the reach of children. Store at room temperature between 15 and 30 degrees C (59 and 86 degrees F). Protect from light. Keep container tightly closed. Throw away any unused medicine after the expiration date. NOTE: This sheet is a summary. It may not cover all possible information. If you have questions about this medicine, talk to your doctor, pharmacist, or health care provider.  2018 Elsevier/Gold Standard (2008-02-24 16:42:18)  

## 2017-05-27 NOTE — Progress Notes (Signed)
[name] Female    DOB: September 02, 1961   56 y.o.   MRN: [id number] Visit Date: 05/27/2017  Today's Provider: [name], PA-C   Chief Complaint  Patient presents with  . Hypertension   Subjective:    HPI   Malignant Hypertension Follow Up Natalie Dickerson presents today to discuss her HTN medications. 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 Dr. Lucky Cowboy performed a renal coiling and stent placement on 01/18/2017. She was seen at [facility] on 03/22/2017 for new onset HTN, and was started on Amlodipine 10 mg. She went to ED the next day c/o headache and syncope. Imaging showed renal stent stenosis and a right renal infarction. Pt was started on Lisinopril 20 mg, and her Amlodipine was changed to 5 mg. A second renal angiography was scheduled for 03/24/2017, and an embolization was performed. She saw Dr. Lucky Cowboy on 04/27/2017. Her BP at that OV was 114/75. Per Dr. Bunnie Domino note, her hypertensive urgency is improving s/p embolization, and she is to FU in 6 months with him. Pt is monitoring BP at home, and her SBP is 117-120 (DBP is unknown). Pt reports the Lisinopril is causing cough, and she would like to change this medication. Pt does not feel comfortable discontinuing the medications. She has tried stopping the Lisinopril, and her home SBP readings were 145 off of the medication.   Allergies  Allergen Reactions  . Bee Venom Anaphylaxis  . Ivp Dye [Iodinated Diagnostic Agents] Rash     Current Outpatient Prescriptions:  .  amLODipine (NORVASC) 5 MG tablet, Take 1 tablet by mouth daily., Disp: , Rfl:  .  aspirin EC 81 MG EC tablet, Take 1 tablet (81 mg total) by mouth daily., Disp: 90 tablet, Rfl: 3 .  cholecalciferol (VITAMIN D) 1000 units tablet, Take 1,000 Units by mouth daily., Disp: , Rfl:  .  lisinopril (PRINIVIL,ZESTRIL) 20 MG tablet, Take 1 tablet (20 mg total) by mouth daily., Disp: 30 tablet, Rfl: 5 .  Multiple Vitamins-Minerals (MULTIVITAMIN  WITH MINERALS) tablet, Take 1 tablet by mouth daily., Disp: , Rfl:  .  Omega-3 Fatty Acids (FISH OIL) 1000 MG CAPS, Take 1 capsule by mouth daily., Disp: , Rfl:  .  vitamin B-12 (CYANOCOBALAMIN) 1000 MCG tablet, Take 1,000 mcg by mouth daily., Disp: , Rfl:   Review of Systems  Constitutional: Positive for fatigue and unexpected weight change (gained). Negative for activity change, appetite change, chills, diaphoresis and fever.  Respiratory: Positive for cough. Negative for shortness of breath.   Cardiovascular: Positive for palpitations (occasional). Negative for chest pain and leg swelling.  Genitourinary: Negative for flank pain.  Neurological: Negative.     Social History  Substance Use Topics  . Smoking status: Former Smoker    Years: 5.00  . Smokeless tobacco: Never Used     Comment: Smoked for about 5 years, smoked about 1 pack per day. Quit in 1984  . Alcohol use Yes     Comment: Occasional alcohol use; Drinks 2-4 glasses of wine per week.   Objective:   BP 116/72 (BP Location: Left Arm, Patient Position: Sitting, Cuff Size: Normal)   Pulse 68   Temp 97.3 F (36.3 C) (Oral)   Resp 16   Wt 140 lb (63.5 kg)   BMI 24.80 kg/m  Vitals:   05/27/17 1552  BP: 116/72  Pulse: 68  Resp: 16  Temp: 97.3 F (36.3 C)  TempSrc: Oral  Weight: 140 lb (63.5 kg)     Physical Exam  Constitutional: She appears well-developed and well-nourished. No distress.  Neck: Normal range of motion. Neck supple. No JVD present. No tracheal deviation present. No thyromegaly present.  Cardiovascular: Normal rate, regular rhythm and normal heart sounds.  Exam reveals no gallop and no friction rub.   No murmur heard. Pulmonary/Chest: Effort normal and breath sounds normal. No respiratory distress. She has no wheezes. She has no rales.  Musculoskeletal: She exhibits no edema.  Lymphadenopathy:    She has no cervical adenopathy.  Skin: She is not diaphoretic.  Vitals reviewed.      Assessment  & Plan:     1. Renovascular hypertension Patient developed cough with lisinopril. BP increased when she self discontinued. Will change lisinopril to losartan. She is to monitor BP and call if BP increases.  - losartan (COZAAR) 50 MG tablet; Take 1 tablet (50 mg total) by mouth daily.  Dispense: 30 tablet; Refill: [place], PA-C  [facility]

## 2017-05-31 ENCOUNTER — Other Ambulatory Visit: Payer: Self-pay | Admitting: Physician Assistant

## 2017-05-31 DIAGNOSIS — I119 Hypertensive heart disease without heart failure: Secondary | ICD-10-CM

## 2017-06-11 ENCOUNTER — Telehealth: Payer: Self-pay | Admitting: Physician Assistant

## 2017-06-11 DIAGNOSIS — I119 Hypertensive heart disease without heart failure: Secondary | ICD-10-CM

## 2017-06-11 DIAGNOSIS — I150 Renovascular hypertension: Secondary | ICD-10-CM

## 2017-06-11 MED ORDER — AMLODIPINE BESYLATE 5 MG PO TABS: 5.0000 mg | ORAL_TABLET | Freq: Every day | ORAL | 1 refills | Status: DC

## 2017-06-11 MED ORDER — LOSARTAN POTASSIUM 100 MG PO TABS: 100.0000 mg | ORAL_TABLET | Freq: Every day | ORAL | 5 refills | Status: DC

## 2017-06-11 MED ORDER — LOSARTAN POTASSIUM 100 MG PO TABS: 100.0000 mg | ORAL_TABLET | Freq: Every day | ORAL | 1 refills | Status: DC

## 2017-06-11 NOTE — Telephone Encounter (Signed)
[facility] faxed a request for the following medications. Thanks CC   amLODipine (NORVASC) 5 MG tablet   losartan (COZAAR) 100 MG tablet

## 2017-06-11 NOTE — Telephone Encounter (Signed)
Patient called and states that you put her on a new blood pressure medication last time she was here and wanted her to call and let her know how it is doing.  She states that her blood pressure has been running high 130- low 140.  Last night she checked it and it was 150 and this morning it was 120.  She also states that you took her off of one blood pressure medication that she thought was causing her to have a tickle in her throat making her cough but it is still happening.

## 2017-06-11 NOTE — Telephone Encounter (Signed)
You refilled the losartan today. Does she need the amlodipine refilled? Please advise. Thanks!

## 2017-06-11 NOTE — Telephone Encounter (Signed)
Can increase to losartan to 100mg . She can take 2 tabs of what she has already. Would recommend for her to try claritin or an OTC allergy medication to make sure she isnt developing a new seasonal allergy since she is still having the throat tickle. Call with updates in 2-4 weeks.

## 2017-06-11 NOTE — Telephone Encounter (Signed)
Please review. Thanks!  

## 2017-06-11 NOTE — Telephone Encounter (Signed)
Advised patient as below.  

## 2017-06-11 NOTE — Telephone Encounter (Signed)
Losartan and amlodipine refills sent to [facility]

## 2017-06-17 DIAGNOSIS — H3562 Retinal hemorrhage, left eye: Secondary | ICD-10-CM | POA: Diagnosis not present

## 2017-06-22 ENCOUNTER — Ambulatory Visit (INDEPENDENT_AMBULATORY_CARE_PROVIDER_SITE_OTHER): Payer: BLUE CROSS/BLUE SHIELD | Admitting: Physician Assistant

## 2017-06-22 ENCOUNTER — Encounter: Payer: Self-pay | Admitting: Physician Assistant

## 2017-06-22 VITALS — BP 100/76 | HR 72 | Temp 98.0°F | Resp 16 | Wt 138.8 lb

## 2017-06-22 DIAGNOSIS — R059 Cough, unspecified: Secondary | ICD-10-CM

## 2017-06-22 DIAGNOSIS — L2489 Irritant contact dermatitis due to other agents: Secondary | ICD-10-CM

## 2017-06-22 DIAGNOSIS — R05 Cough: Secondary | ICD-10-CM | POA: Diagnosis not present

## 2017-06-22 MED ORDER — PREDNISONE 10 MG (21) PO TBPK: ORAL_TABLET | ORAL | 0 refills | Status: DC

## 2017-06-22 NOTE — Patient Instructions (Signed)

## 2017-06-22 NOTE — Progress Notes (Signed)
[name] Female    DOB: Sep 21, 1961   56 y.o.   MRN: [id number] Visit Date: 06/22/2017  Today's Provider: [name], PA-C   Chief Complaint  Patient presents with  . Rash   Subjective:    HPI Patient here today C/O rash since Saturday on several areas of her body. Patient reports rash is very itchy and spreading on her body. Patient reports she was out in her yard on Saturday. Patient reports rash maybe from poison oak or ivy. Patient reports that she has taken one dose of Benadryl and and using OTC creams, reports mild improvement.   Patient C/O persistent dry cough for several months. Patient reports that she took Claritin for 4-5 days did not notice any improvement so she decided to discontinue. Patient reports she feels like she has "dust bunny" in her throat.    Allergies  Allergen Reactions  . Bee Venom Anaphylaxis  . Ivp Dye [Iodinated Diagnostic Agents] Rash     Current Outpatient Prescriptions:  .  amLODipine (NORVASC) 5 MG tablet, Take 1 tablet (5 mg total) by mouth daily. In evening., Disp: 90 tablet, Rfl: 1 .  aspirin EC 81 MG EC tablet, Take 1 tablet (81 mg total) by mouth daily., Disp: 90 tablet, Rfl: 3 .  cholecalciferol (VITAMIN D) 1000 units tablet, Take 1,000 Units by mouth daily., Disp: , Rfl:  .  losartan (COZAAR) 100 MG tablet, Take 1 tablet (100 mg total) by mouth daily., Disp: 90 tablet, Rfl: 1 .  Multiple Vitamins-Minerals (MULTIVITAMIN WITH MINERALS) tablet, Take 1 tablet by mouth daily., Disp: , Rfl:  .  Omega-3 Fatty Acids (FISH OIL) 1000 MG CAPS, Take 1 capsule by mouth daily., Disp: , Rfl:  .  vitamin B-12 (CYANOCOBALAMIN) 1000 MCG tablet, Take 1,000 mcg by mouth daily., Disp: , Rfl:   Review of Systems  Constitutional: Negative.   Respiratory: Positive for cough.   Cardiovascular: Negative.   Gastrointestinal: Negative.   Skin: Positive for rash.    Social History  Substance Use Topics  . Smoking status: Former  Smoker    Years: 5.00  . Smokeless tobacco: Never Used     Comment: Smoked for about 5 years, smoked about 1 pack per day. Quit in 1984  . Alcohol use Yes     Comment: Occasional alcohol use; Drinks 2-4 glasses of wine per week.   Objective:   BP 100/76 (BP Location: Left Arm, Patient Position: Sitting, Cuff Size: Normal)   Pulse 72   Temp 98 F (36.7 C) (Oral)   Resp 16   Wt 138 lb 12.8 oz (63 kg)   SpO2 98%   BMI 24.59 kg/m  Vitals:   06/22/17 1128  BP: 100/76  Pulse: 72  Resp: 16  Temp: 98 F (36.7 C)  TempSrc: Oral  SpO2: 98%  Weight: 138 lb 12.8 oz (63 kg)     Physical Exam  Constitutional: She appears well-developed and well-nourished. No distress.  Neck: Normal range of motion. Neck supple.  Cardiovascular: Normal rate, regular rhythm and normal heart sounds.  Exam reveals no gallop and no friction rub.   No murmur heard. Pulmonary/Chest: Effort normal and breath sounds normal. No respiratory distress. She has no wheezes. She has no rales.  Skin: She is not diaphoretic.     Vitals reviewed.       Assessment & Plan:     1. Irritant contact dermatitis due to other agents Will give  prednisone taper as below. She may continue benadryl prn. Hydrocortisone cream prn. Call if no improvement.  - predniSONE (STERAPRED UNI-PAK 21 TAB) 10 MG (21) TBPK tablet; Take as directed on package instructions  Dispense: 21 tablet; Refill: 0  2. Cough Continued dry cough. Did not respond to loratadine. Will see if it responds to prednisone as above. If no improvement she is to call back and we will get CXR for further evaluation.        [name], PA-C  [facility]

## 2017-06-29 DIAGNOSIS — N280 Ischemia and infarction of kidney: Secondary | ICD-10-CM | POA: Diagnosis not present

## 2017-06-29 DIAGNOSIS — I10 Essential (primary) hypertension: Secondary | ICD-10-CM | POA: Diagnosis not present

## 2017-07-06 ENCOUNTER — Encounter: Payer: Self-pay | Admitting: Physician Assistant

## 2017-08-12 ENCOUNTER — Encounter: Payer: Self-pay | Admitting: Physician Assistant

## 2017-08-12 ENCOUNTER — Ambulatory Visit (INDEPENDENT_AMBULATORY_CARE_PROVIDER_SITE_OTHER): Payer: BLUE CROSS/BLUE SHIELD | Admitting: Physician Assistant

## 2017-08-12 VITALS — BP 120/80 | HR 78 | Temp 98.4°F | Resp 16 | Wt 135.8 lb

## 2017-08-12 DIAGNOSIS — J40 Bronchitis, not specified as acute or chronic: Secondary | ICD-10-CM

## 2017-08-12 DIAGNOSIS — R059 Cough, unspecified: Secondary | ICD-10-CM

## 2017-08-12 DIAGNOSIS — Z1231 Encounter for screening mammogram for malignant neoplasm of breast: Secondary | ICD-10-CM | POA: Diagnosis not present

## 2017-08-12 DIAGNOSIS — R05 Cough: Secondary | ICD-10-CM

## 2017-08-12 DIAGNOSIS — Z1239 Encounter for other screening for malignant neoplasm of breast: Secondary | ICD-10-CM

## 2017-08-12 MED ORDER — HYDROCODONE-HOMATROPINE 5-1.5 MG/5ML PO SYRP: 5.0000 mL | ORAL_SOLUTION | Freq: Three times a day (TID) | ORAL | 0 refills | Status: DC | PRN

## 2017-08-12 MED ORDER — PREDNISONE 10 MG (21) PO TBPK: ORAL_TABLET | ORAL | 0 refills | Status: DC

## 2017-08-12 MED ORDER — AZITHROMYCIN 250 MG PO TABS: ORAL_TABLET | ORAL | 0 refills | Status: DC

## 2017-08-12 NOTE — Progress Notes (Signed)
[name] Female    DOB: Dec 08, 1961   56 y.o.   MRN: [id number] Visit Date: 08/12/2017  Today's Provider: [name], PA-C   Chief Complaint  Patient presents with  . Cough   Subjective:    HPI Cough: Patient complains of nonproductive cough.  Symptoms began 4 days ago.  The cough is non-productive, without wheezing, dyspnea or hemoptysis and is aggravated by reclining position Associated symptoms include:ears are clogged up. Patient does not have new pets. Patient does not have a history of asthma. Patient does not have a history of environmental allergens. Patient has not recent travel. Patient does have a history of smoking.  Patient reports that her husband is being treated for bronchitis, and reports that her grandchildern are also sick.      Allergies  Allergen Reactions  . Bee Venom Anaphylaxis  . Ivp Dye [Iodinated Diagnostic Agents] Rash     Current Outpatient Prescriptions:  .  amLODipine (NORVASC) 5 MG tablet, Take 1 tablet (5 mg total) by mouth daily. In evening., Disp: 90 tablet, Rfl: 1 .  aspirin EC 81 MG EC tablet, Take 1 tablet (81 mg total) by mouth daily., Disp: 90 tablet, Rfl: 3 .  cholecalciferol (VITAMIN D) 1000 units tablet, Take 1,000 Units by mouth daily., Disp: , Rfl:  .  losartan (COZAAR) 100 MG tablet, Take 1 tablet (100 mg total) by mouth daily., Disp: 90 tablet, Rfl: 1 .  Multiple Vitamins-Minerals (MULTIVITAMIN WITH MINERALS) tablet, Take 1 tablet by mouth daily., Disp: , Rfl:  .  Omega-3 Fatty Acids (FISH OIL) 1000 MG CAPS, Take 1 capsule by mouth daily., Disp: , Rfl:  .  vitamin B-12 (CYANOCOBALAMIN) 1000 MCG tablet, Take 1,000 mcg by mouth daily., Disp: , Rfl:   Review of Systems  Constitutional: Negative.  Negative for fever.  HENT: Positive for congestion and sore throat.   Respiratory: Positive for cough and chest tightness.   Cardiovascular: Negative.   Gastrointestinal: Negative.   Neurological: Positive for  headaches. Negative for dizziness and light-headedness.    Social History  Substance Use Topics  . Smoking status: Former Smoker    Years: 5.00  . Smokeless tobacco: Never Used     Comment: Smoked for about 5 years, smoked about 1 pack per day. Quit in 1984  . Alcohol use Yes     Comment: Occasional alcohol use; Drinks 2-4 glasses of wine per week.   Objective:   BP 120/80 (BP Location: Left Arm, Patient Position: Sitting, Cuff Size: Large)   Pulse 78   Temp 98.4 F (36.9 C) (Oral)   Resp 16   Wt 135 lb 12.8 oz (61.6 kg)   SpO2 98%   BMI 24.06 kg/m  Vitals:   08/12/17 1627  BP: 120/80  Pulse: 78  Resp: 16  Temp: 98.4 F (36.9 C)  TempSrc: Oral  SpO2: 98%  Weight: 135 lb 12.8 oz (61.6 kg)     Physical Exam  Constitutional: She appears well-developed and well-nourished. No distress.  HENT:  Head: Normocephalic and atraumatic.  Right Ear: Hearing, tympanic membrane, external ear and ear canal normal.  Left Ear: Hearing, tympanic membrane, external ear and ear canal normal.  Nose: Nose normal.  Mouth/Throat: Uvula is midline, oropharynx is clear and moist and mucous membranes are normal. No oropharyngeal exudate.  Eyes: Pupils are equal, round, and reactive to light. Conjunctivae are normal. Right eye exhibits no discharge. Left eye exhibits no discharge. No  scleral icterus.  Neck: Normal range of motion. Neck supple. No tracheal deviation present. No thyromegaly present.  Cardiovascular: Normal rate, regular rhythm and normal heart sounds.  Exam reveals no gallop and no friction rub.   No murmur heard. Pulmonary/Chest: Effort normal. No stridor. No respiratory distress. She has decreased breath sounds in the right upper field, the right middle field, the left upper field and the left middle field. She has no wheezes. She has no rhonchi. She has no rales.  Lymphadenopathy:    She has no cervical adenopathy.  Skin: Skin is warm and dry. She is not diaphoretic.  Vitals  reviewed.      Assessment & Plan:     1. Bronchitis Worsening symptoms that have not responded to OTC medications. Will give zpak and prednisone as below. Continue allergy medications. Stay well hydrated and get plenty of rest. Call if no symptom improvement or if symptoms worsen. - azithromycin (ZITHROMAX) 250 MG tablet; Take 2 tablets PO on day one, and one tablet PO daily thereafter until completed.  Dispense: 6 tablet; Refill: 0 - predniSONE (STERAPRED UNI-PAK 21 TAB) 10 MG (21) TBPK tablet; Take as directed on package instructions  Dispense: 21 tablet; Refill: 0  2. Cough Worsening symptoms that has not responded to OTC medications. Will give Hycodan cough syrup as below for nighttime cough. Drowsiness precautions given to patient. Stay well hydrated. Use delsym, robitussin OR mucinex for daytime cough. - HYDROcodone-homatropine (HYCODAN) 5-1.5 MG/5ML syrup; Take 5 mLs by mouth every 8 (eight) hours as needed for cough.  Dispense: 120 mL; Refill: 0  3. Breast cancer screening Due for mammogram. Patient will call back to schedule CPE in Sept. Needs to verify with insurance company that she can get early.  - MM Digital Screening; Future       [name], PA-C  [facility]

## 2017-08-12 NOTE — Patient Instructions (Signed)

## 2017-09-23 ENCOUNTER — Ambulatory Visit
Admission: RE | Admit: 2017-09-23 | Discharge: 2017-09-23 | Disposition: A | Discharge status: Home / Self Care | Payer: BLUE CROSS/BLUE SHIELD | Source: Ambulatory Visit | Attending: Physician Assistant | Admitting: Physician Assistant

## 2017-09-23 DIAGNOSIS — Z1231 Encounter for screening mammogram for malignant neoplasm of breast: Secondary | ICD-10-CM | POA: Insufficient documentation

## 2017-09-23 DIAGNOSIS — Z1239 Encounter for other screening for malignant neoplasm of breast: Secondary | ICD-10-CM

## 2017-09-24 ENCOUNTER — Telehealth: Payer: Self-pay

## 2017-09-24 NOTE — Telephone Encounter (Signed)
-----   Message from [name], PA-C sent at 09/24/2017  1:16 PM EDT ----- Normal mammogram. Repeat screening in one year.

## 2017-09-24 NOTE — Telephone Encounter (Signed)
Patient advised as directed below.  Thanks,  -Jason Hauge 

## 2017-09-29 ENCOUNTER — Telehealth (INDEPENDENT_AMBULATORY_CARE_PROVIDER_SITE_OTHER): Payer: Self-pay | Admitting: Vascular Surgery

## 2017-09-29 NOTE — Telephone Encounter (Signed)
TRINIA WANTS TO KNOW WHAT ALL THE ULTRASOUNDS ARE FOR ON HER NEXT APPOINTMENT IN November, SHE MAY WANT TO CANCEL THE APPOINTMENTS, SHE ALSO WANTS TO KNOW WHAT THE CORRECT NAME OF THE PROCEDURE AND WHAT WAS DONE WHEN DR Lucky Cowboy  DID SURGERY ON HER KIDNEY. PLEASE CALL HER AFTER 4PM [phone].

## 2017-09-30 DIAGNOSIS — D2339 Other benign neoplasm of skin of other parts of face: Secondary | ICD-10-CM | POA: Diagnosis not present

## 2017-09-30 DIAGNOSIS — L570 Actinic keratosis: Secondary | ICD-10-CM | POA: Diagnosis not present

## 2017-09-30 DIAGNOSIS — Z85828 Personal history of other malignant neoplasm of skin: Secondary | ICD-10-CM | POA: Diagnosis not present

## 2017-09-30 DIAGNOSIS — Z1283 Encounter for screening for malignant neoplasm of skin: Secondary | ICD-10-CM | POA: Diagnosis not present

## 2017-09-30 DIAGNOSIS — D225 Melanocytic nevi of trunk: Secondary | ICD-10-CM | POA: Diagnosis not present

## 2017-10-01 ENCOUNTER — Other Ambulatory Visit: Payer: Self-pay | Admitting: Physician Assistant

## 2017-10-01 DIAGNOSIS — I150 Renovascular hypertension: Secondary | ICD-10-CM

## 2017-10-01 DIAGNOSIS — I119 Hypertensive heart disease without heart failure: Secondary | ICD-10-CM

## 2017-10-11 ENCOUNTER — Ambulatory Visit (INDEPENDENT_AMBULATORY_CARE_PROVIDER_SITE_OTHER): Payer: BLUE CROSS/BLUE SHIELD | Admitting: Physician Assistant

## 2017-10-11 ENCOUNTER — Encounter: Payer: Self-pay | Admitting: Physician Assistant

## 2017-10-11 VITALS — BP 110/80 | HR 64 | Temp 98.2°F | Resp 16 | Ht 63.0 in | Wt 136.8 lb

## 2017-10-11 DIAGNOSIS — I119 Hypertensive heart disease without heart failure: Secondary | ICD-10-CM

## 2017-10-11 DIAGNOSIS — G5603 Carpal tunnel syndrome, bilateral upper limbs: Secondary | ICD-10-CM

## 2017-10-11 DIAGNOSIS — N280 Ischemia and infarction of kidney: Secondary | ICD-10-CM | POA: Diagnosis not present

## 2017-10-11 DIAGNOSIS — Z136 Encounter for screening for cardiovascular disorders: Secondary | ICD-10-CM

## 2017-10-11 DIAGNOSIS — E559 Vitamin D deficiency, unspecified: Secondary | ICD-10-CM | POA: Diagnosis not present

## 2017-10-11 DIAGNOSIS — Z2821 Immunization not carried out because of patient refusal: Secondary | ICD-10-CM | POA: Diagnosis not present

## 2017-10-11 DIAGNOSIS — Z0000 Encounter for general adult medical examination without abnormal findings: Secondary | ICD-10-CM | POA: Diagnosis not present

## 2017-10-11 DIAGNOSIS — Z13220 Encounter for screening for lipoid disorders: Secondary | ICD-10-CM

## 2017-10-11 DIAGNOSIS — E538 Deficiency of other specified B group vitamins: Secondary | ICD-10-CM

## 2017-10-11 LAB — POCT URINALYSIS DIPSTICK
Bilirubin, UA: NEGATIVE
Blood, UA: NEGATIVE
Glucose, UA: NEGATIVE
Ketones, UA: NEGATIVE
Leukocytes, UA: NEGATIVE
Nitrite, UA: NEGATIVE
Protein, UA: NEGATIVE
Spec Grav, UA: 1.025 (ref 1.010–1.025)
Urobilinogen, UA: 0.2 E.U./dL
pH, UA: 6 (ref 5.0–8.0)

## 2017-10-11 NOTE — Progress Notes (Signed)
[name], Female    DOB: August 07, 1961, 56 y.o.   MRN: [id number] Visit Date: 10/11/2017  Today's Provider: [name], PA-C   Chief Complaint  Patient presents with  . Annual Exam   Subjective:    Annual physical exam Natalie Dickerson is a 56 y.o. female who presents today for health maintenance and complete physical. She feels well. She reports exercising 2-3 days a week. She reports she is sleeping fairly well. 10/28/16 CPE 10/28/16 Pap-neg; HPV-neg 09/24/17 Mammogram-BI-RADS 1 08/03/14 Colonoscopy-adenomatous polyp, recheck in 5 years ----------------------------------------------------------------- Patient C/O bilateral hand pain/numbness worsening in the last month. Patient reports pain and numbness bothers her only during the night.   Review of Systems  Constitutional: Negative.   HENT: Positive for rhinorrhea.   Eyes: Negative.   Respiratory: Negative.   Cardiovascular: Negative.   Gastrointestinal: Negative.   Endocrine: Negative.   Genitourinary: Negative.   Musculoskeletal: Positive for back pain and neck pain.  Skin: Negative.   Allergic/Immunologic: Negative.   Neurological: Positive for numbness.  Hematological: Negative.   Psychiatric/Behavioral: Negative.     Social History      She  reports that she has quit smoking. She quit after 5.00 years of use. She has never used smokeless tobacco. She reports that she drinks alcohol. She reports that she does not use drugs.       Social History   Social History  . Marital status: Married    Spouse name: N/A  . Number of children: 2  . Years of education: N/A   Social History Main Topics  . Smoking status: Former Smoker    Years: 5.00  . Smokeless tobacco: Never Used     Comment: Smoked for about 5 years, smoked about 1 pack per day. Quit in 1984  . Alcohol use Yes     Comment: Occasional alcohol use; Drinks 2-4 glasses of wine per week.  . Drug use: No  . Sexual activity: Not  Asked   Other Topics Concern  . None   Social History Narrative  . None    Past Medical History:  Diagnosis Date  . Cancer [facility])    skin ca     Patient Active Problem List   Diagnosis Date Noted  . Pulsatile tinnitus 04/27/2017  . Acute renal artery occlusion (HCC)   . Renal infarction ([place])   . Malignant HTN with heart disease, w/o CHF, w/o chronic kidney disease 03/23/2017  . Renal artery aneurysm of native kidney ([place]) 12/29/2016  . Pulmonary granulomatosis 12/29/2016  . Avitaminosis D 10/25/2015  . B12 deficiency 10/25/2015  . History of chicken pox 10/22/2015  . Uterine fibroid 10/22/2015  . Basal cell carcinoma of face 09/27/2013  . Cervical pain 07/19/2008  . Irregular bleeding 07/19/2008    Past Surgical History:  Procedure Laterality Date  . EMBOLIZATION N/A 03/24/2017   Procedure: Embolization;  Surgeon: Algernon Huxley, MD;  Location: [place] [facility];  Service: Cardiovascular;  Laterality: N/A;  . FINGER SURGERY    . PERIPHERAL VASCULAR CATHETERIZATION N/A 01/18/2017   Procedure: Renal Angiography;  Surgeon: Algernon Huxley, MD;  Location: [place] [facility];  Service: Cardiovascular;  Laterality: N/A;  . PERIPHERAL VASCULAR CATHETERIZATION  01/18/2017   Procedure: Renal Intervention;  Surgeon: Algernon Huxley, MD;  Location: [place] [facility];  Service: Cardiovascular;;  . RENAL ANGIOGRAPHY N/A 03/24/2017   Procedure: Renal Angiography;  Surgeon: Algernon Huxley, MD;  Location:  [place] [facility];  Service: Cardiovascular;  Laterality: N/A;    Family History        Family Status  Relation Status  . Mother Alive  . Father Deceased at age 66       died from breast cancer   . MGM Deceased  . MGF Deceased  . PGM Deceased  . PGF Deceased  . Sister Alive  . Brother Alive  . Brother Alive  . Son Alive  . Son Alive        Her family history includes Breast cancer (age of onset: 71) in her father; Breast cancer (age of onset: 64) in her mother; Cancer in her  mother; Hypertension in her brother and mother.     Allergies  Allergen Reactions  . Bee Venom Anaphylaxis  . Ivp Dye [Iodinated Diagnostic Agents] Rash     Current Outpatient Prescriptions:  .  amLODipine (NORVASC) 5 MG tablet, TAKE 1 TABLET BY MOUTH  DAILY IN THE EVENING, Disp: 90 tablet, Rfl: 1 .  aspirin EC 81 MG EC tablet, Take 1 tablet (81 mg total) by mouth daily., Disp: 90 tablet, Rfl: 3 .  cholecalciferol (VITAMIN D) 1000 units tablet, Take 1,000 Units by mouth daily., Disp: , Rfl:  .  losartan (COZAAR) 100 MG tablet, TAKE 1 TABLET BY MOUTH  DAILY, Disp: 90 tablet, Rfl: 1 .  Multiple Vitamins-Minerals (MULTIVITAMIN WITH MINERALS) tablet, Take 1 tablet by mouth daily., Disp: , Rfl:  .  Omega-3 Fatty Acids (FISH OIL) 1000 MG CAPS, Take 1 capsule by mouth daily., Disp: , Rfl:  .  vitamin B-12 (CYANOCOBALAMIN) 1000 MCG tablet, Take 1,000 mcg by mouth daily., Disp: , Rfl:    Patient Care Team: [name], PA-C as PCP - General (Family Medicine)      Objective:   Vitals: BP 110/80 (BP Location: Left Arm, Patient Position: Sitting, Cuff Size: Normal)   Pulse 64   Temp 98.2 F (36.8 C) (Oral)   Resp 16   Ht 5\' 3"  (1.6 m)   Wt 136 lb 12.8 oz (62.1 kg)   SpO2 98%   BMI 24.23 kg/m    Vitals:   10/11/17 1514  BP: 110/80  Pulse: 64  Resp: 16  Temp: 98.2 F (36.8 C)  TempSrc: Oral  SpO2: 98%  Weight: 136 lb 12.8 oz (62.1 kg)  Height: 5\' 3"  (1.6 m)     Physical Exam  Constitutional: She is oriented to person, place, and time. She appears well-developed and well-nourished. No distress.  HENT:  Head: Normocephalic and atraumatic.  Right Ear: Hearing, tympanic membrane, external ear and ear canal normal.  Left Ear: Hearing, tympanic membrane, external ear and ear canal normal.  Nose: Nose normal. Right sinus exhibits no maxillary sinus tenderness and no frontal sinus tenderness. Left sinus exhibits no maxillary sinus tenderness and no frontal sinus tenderness.    Mouth/Throat: Uvula is midline, oropharynx is clear and moist and mucous membranes are normal. No oropharyngeal exudate.  Eyes: Pupils are equal, round, and reactive to light. Conjunctivae and EOM are normal. Right eye exhibits no discharge. Left eye exhibits no discharge. No scleral icterus.  Neck: Normal range of motion. Neck supple. No JVD present. Carotid bruit is not present. No tracheal deviation present. No thyromegaly present.  Cardiovascular: Normal rate, regular rhythm, normal heart sounds and intact distal pulses.  Exam reveals no gallop and no friction rub.   No murmur heard. Pulmonary/Chest: Effort normal and breath sounds normal. No respiratory  distress. She has no wheezes. She has no rales. She exhibits no tenderness.  Abdominal: Soft. Bowel sounds are normal. She exhibits no distension and no mass. There is no tenderness. There is no rebound and no guarding.  Musculoskeletal: Normal range of motion. She exhibits no edema or tenderness.  Positive Tinel and Phalen signs bilaterally  Lymphadenopathy:    She has no cervical adenopathy.  Neurological: She is alert and oriented to person, place, and time.  Skin: Skin is warm and dry. No rash noted. She is not diaphoretic.  Psychiatric: She has a normal mood and affect. Her behavior is normal. Judgment and thought content normal.  Vitals reviewed.   Depression Screen PHQ 2/9 Scores 10/11/2017  PHQ - 2 Score 0  PHQ- 9 Score 2     Assessment & Plan:     Routine Health Maintenance and Physical Exam  Exercise Activities and Dietary recommendations Goals    None      Immunization History  Administered Date(s) Administered  . Td 07/13/2016  . Tdap 08/16/2008    Health Maintenance  Topic Date Due  . INFLUENZA VACCINE  07/28/2017  . COLONOSCOPY  08/04/2019  . MAMMOGRAM  09/24/2019  . PAP SMEAR  10/29/2019  . TETANUS/TDAP  07/13/2026  . Hepatitis C Screening  Completed  . HIV Screening  Completed     Discussed  health benefits of physical activity, and encouraged her to engage in regular exercise appropriate for her age and condition.    1. Annual physical exam Normal physical exam today. Will check labs as below and f/u pending lab results. If labs are stable and WNL she will not need to have these rechecked for one year at her next annual physical exam. She is to call the office in the meantime if she has any acute issue, questions or concerns. - CBC with Differential/Platelet - Comprehensive metabolic panel - TSH  2. Avitaminosis D H/O this. Will check labs as below and f/u pending results. - VITAMIN D 25 Hydroxy (Vit-D Deficiency, Fractures)  3. B12 deficiency H/O this on OTC supplementation. Will check labs as below and f/u pending results. - Vitamin B12  4. Malignant HTN with heart disease, w/o CHF, w/o chronic kidney disease Stable. Continue Losartan 100mg  and amlodipine 5mg . Will check labs as below and f/u pending results. - Comprehensive metabolic panel  5. Acute renal artery occlusion (HCC) Stent occluded after repair for renal artery aneurysm. Followed by Dr. Lucky Cowboy.  - Comprehensive metabolic panel  6. Renal infarction [facility]) Right renal infarct was secondary outcome to occluded right renal artery from occluded stent from repairing a right renal artery aneurysm. UA normal today in the office. Will check labs as below and f/u pending results. - Comprehensive metabolic panel - POCT Urinalysis Dipstick  7. Bilateral carpal tunnel syndrome New onset over last month. Notice more after lifting weight days. Patient should avoid NSAIDs due to solitary kidney from right renal infarct to protect the left kidney. Advised to wear night splints. Referral placed to orthopedic-[facility] for consultation and evaluation.  - Ambulatory referral to Orthopedic Surgery  8. Encounter for lipid screening for cardiovascular disease Will check labs as below and f/u pending results. - Lipid panel  9.  Refused influenza vaccine --------------------------------------------------------------------    [name], PA-C  [facility]

## 2017-10-11 NOTE — Patient Instructions (Signed)
Health Maintenance for Postmenopausal Women Menopause is a normal process in which your reproductive ability comes to an end. This process happens gradually over a span of months to years, usually between the ages of 67 and 16. Menopause is complete when you have missed 12 consecutive menstrual periods. It is important to talk with your health care provider about some of the most common conditions that affect postmenopausal women, such as heart disease, cancer, and bone loss (osteoporosis). Adopting a healthy lifestyle and getting preventive care can help to promote your health and wellness. Those actions can also lower your chances of developing some of these common conditions. What should I know about menopause? During menopause, you may experience a number of symptoms, such as:  Moderate-to-severe hot flashes.  Night sweats.  Decrease in sex drive.  Mood swings.  Headaches.  Tiredness.  Irritability.  Memory problems.  Insomnia.  Choosing to treat or not to treat menopausal changes is an individual decision that you make with your health care provider. What should I know about hormone replacement therapy and supplements? Hormone therapy products are effective for treating symptoms that are associated with menopause, such as hot flashes and night sweats. Hormone replacement carries certain risks, especially as you become older. If you are thinking about using estrogen or estrogen with progestin treatments, discuss the benefits and risks with your health care provider. What should I know about heart disease and stroke? Heart disease, heart attack, and stroke become more likely as you age. This may be due, in part, to the hormonal changes that your body experiences during menopause. These can affect how your body processes dietary fats, triglycerides, and cholesterol. Heart attack and stroke are both medical emergencies. There are many things that you can do to help prevent heart disease  and stroke:  Have your blood pressure checked at least every 1-2 years. High blood pressure causes heart disease and increases the risk of stroke.  If you are 18-22 years old, ask your health care provider if you should take aspirin to prevent a heart attack or a stroke.  Do not use any tobacco products, including cigarettes, chewing tobacco, or electronic cigarettes. If you need help quitting, ask your health care provider.  It is important to eat a healthy diet and maintain a healthy weight. ? Be sure to include plenty of vegetables, fruits, low-fat dairy products, and lean protein. ? Avoid eating foods that are high in solid fats, added sugars, or salt (sodium).  Get regular exercise. This is one of the most important things that you can do for your health. ? Try to exercise for at least 150 minutes each week. The type of exercise that you do should increase your heart rate and make you sweat. This is known as moderate-intensity exercise. ? Try to do strengthening exercises at least twice each week. Do these in addition to the moderate-intensity exercise.  Know your numbers.Ask your health care provider to check your cholesterol and your blood glucose. Continue to have your blood tested as directed by your health care provider.  What should I know about cancer screening? There are several types of cancer. Take the following steps to reduce your risk and to catch any cancer development as early as possible. Breast Cancer  Practice breast self-awareness. ? This means understanding how your breasts normally appear and feel. ? It also means doing regular breast self-exams. Let your health care provider know about any changes, no matter how small.  If you are 40  or older, have a clinician do a breast exam (clinical breast exam or CBE) every year. Depending on your age, family history, and medical history, it may be recommended that you also have a yearly breast X-ray (mammogram).  If you  have a family history of breast cancer, talk with your health care provider about genetic screening.  If you are at high risk for breast cancer, talk with your health care provider about having an MRI and a mammogram every year.  Breast cancer (BRCA) gene test is recommended for women who have family members with BRCA-related cancers. Results of the assessment will determine the need for genetic counseling and BRCA1 and for BRCA2 testing. BRCA-related cancers include these types: ? Breast. This occurs in males or females. ? Ovarian. ? Tubal. This may also be called fallopian tube cancer. ? Cancer of the abdominal or pelvic lining (peritoneal cancer). ? Prostate. ? Pancreatic.  Cervical, Uterine, and Ovarian Cancer Your health care provider may recommend that you be screened regularly for cancer of the pelvic organs. These include your ovaries, uterus, and vagina. This screening involves a pelvic exam, which includes checking for microscopic changes to the surface of your cervix (Pap test).  For women ages 21-65, health care providers may recommend a pelvic exam and a Pap test every three years. For women ages 79-65, they may recommend the Pap test and pelvic exam, combined with testing for human papilloma virus (HPV), every five years. Some types of HPV increase your risk of cervical cancer. Testing for HPV may also be done on women of any age who have unclear Pap test results.  Other health care providers may not recommend any screening for nonpregnant women who are considered low risk for pelvic cancer and have no symptoms. Ask your health care provider if a screening pelvic exam is right for you.  If you have had past treatment for cervical cancer or a condition that could lead to cancer, you need Pap tests and screening for cancer for at least 20 years after your treatment. If Pap tests have been discontinued for you, your risk factors (such as having a new sexual partner) need to be  reassessed to determine if you should start having screenings again. Some women have medical problems that increase the chance of getting cervical cancer. In these cases, your health care provider may recommend that you have screening and Pap tests more often.  If you have a family history of uterine cancer or ovarian cancer, talk with your health care provider about genetic screening.  If you have vaginal bleeding after reaching menopause, tell your health care provider.  There are currently no reliable tests available to screen for ovarian cancer.  Lung Cancer Lung cancer screening is recommended for adults 69-62 years old who are at high risk for lung cancer because of a history of smoking. A yearly low-dose CT scan of the lungs is recommended if you:  Currently smoke.  Have a history of at least 30 pack-years of smoking and you currently smoke or have quit within the past 15 years. A pack-year is smoking an average of one pack of cigarettes per day for one year.  Yearly screening should:  Continue until it has been 15 years since you quit.  Stop if you develop a health problem that would prevent you from having lung cancer treatment.  Colorectal Cancer  This type of cancer can be detected and can often be prevented.  Routine colorectal cancer screening usually begins at  age 42 and continues through age 45.  If you have risk factors for colon cancer, your health care provider may recommend that you be screened at an earlier age.  If you have a family history of colorectal cancer, talk with your health care provider about genetic screening.  Your health care provider may also recommend using home test kits to check for hidden blood in your stool.  A small camera at the end of a tube can be used to examine your colon directly (sigmoidoscopy or colonoscopy). This is done to check for the earliest forms of colorectal cancer.  Direct examination of the colon should be repeated every  5-10 years until age 71. However, if early forms of precancerous polyps or small growths are found or if you have a family history or genetic risk for colorectal cancer, you may need to be screened more often.  Skin Cancer  Check your skin from head to toe regularly.  Monitor any moles. Be sure to tell your health care provider: ? About any new moles or changes in moles, especially if there is a change in a mole's shape or color. ? If you have a mole that is larger than the size of a pencil eraser.  If any of your family members has a history of skin cancer, especially at a young age, talk with your health care provider about genetic screening.  Always use sunscreen. Apply sunscreen liberally and repeatedly throughout the day.  Whenever you are outside, protect yourself by wearing long sleeves, pants, a wide-brimmed hat, and sunglasses.  What should I know about osteoporosis? Osteoporosis is a condition in which bone destruction happens more quickly than new bone creation. After menopause, you may be at an increased risk for osteoporosis. To help prevent osteoporosis or the bone fractures that can happen because of osteoporosis, the following is recommended:  If you are 46-71 years old, get at least 1,000 mg of calcium and at least 600 mg of vitamin D per day.  If you are older than age 55 but younger than age 65, get at least 1,200 mg of calcium and at least 600 mg of vitamin D per day.  If you are older than age 54, get at least 1,200 mg of calcium and at least 800 mg of vitamin D per day.  Smoking and excessive alcohol intake increase the risk of osteoporosis. Eat foods that are rich in calcium and vitamin D, and do weight-bearing exercises several times each week as directed by your health care provider. What should I know about how menopause affects my mental health? Depression may occur at any age, but it is more common as you become older. Common symptoms of depression  include:  Low or sad mood.  Changes in sleep patterns.  Changes in appetite or eating patterns.  Feeling an overall lack of motivation or enjoyment of activities that you previously enjoyed.  Frequent crying spells.  Talk with your health care provider if you think that you are experiencing depression. What should I know about immunizations? It is important that you get and maintain your immunizations. These include:  Tetanus, diphtheria, and pertussis (Tdap) booster vaccine.  Influenza every year before the flu season begins.  Pneumonia vaccine.  Shingles vaccine.  Your health care provider may also recommend other immunizations. This information is not intended to replace advice given to you by your health care provider. Make sure you discuss any questions you have with your health care provider. Document Released: 02/05/2006  Document Revised: 07/03/2016 Document Reviewed: 09/17/2015 Elsevier Interactive Patient Education  2018 [employer]  

## 2017-10-12 DIAGNOSIS — N280 Ischemia and infarction of kidney: Secondary | ICD-10-CM | POA: Diagnosis not present

## 2017-10-12 DIAGNOSIS — Z136 Encounter for screening for cardiovascular disorders: Secondary | ICD-10-CM | POA: Diagnosis not present

## 2017-10-12 DIAGNOSIS — I119 Hypertensive heart disease without heart failure: Secondary | ICD-10-CM | POA: Diagnosis not present

## 2017-10-12 DIAGNOSIS — E538 Deficiency of other specified B group vitamins: Secondary | ICD-10-CM | POA: Diagnosis not present

## 2017-10-13 LAB — LIPID PANEL
Cholesterol: 173 mg/dL (ref <200)
HDL: 83 mg/dL (ref >50)
LDL Cholesterol (Calc): 67 mg/dL (calc)
Non-HDL Cholesterol (Calc): 90 mg/dL (calc) (ref <130)
Total CHOL/HDL Ratio: 2.1 (calc) (ref <5.0)
Triglycerides: 142 mg/dL (ref <150)

## 2017-10-13 LAB — CBC WITH DIFFERENTIAL/PLATELET
Basophils Absolute: 72 cells/uL (ref 0–200)
Basophils Relative: 1.3 %
Eosinophils Absolute: 253 cells/uL (ref 15–500)
Eosinophils Relative: 4.6 %
HCT: 41.2 % (ref 35.0–45.0)
Hemoglobin: 14 g/dL (ref 11.7–15.5)
Lymphs Abs: 1040 cells/uL (ref 850–3900)
MCH: 30 pg (ref 27.0–33.0)
MCHC: 34 g/dL (ref 32.0–36.0)
MCV: 88.2 fL (ref 80.0–100.0)
MPV: 9.9 fL (ref 7.5–12.5)
Monocytes Relative: 8.4 %
Neutro Abs: 3674 cells/uL (ref 1500–7800)
Neutrophils Relative %: 66.8 %
Platelets: 281 10*3/uL (ref 140–400)
RBC: 4.67 10*6/uL (ref 3.80–5.10)
RDW: 11.9 % (ref 11.0–15.0)
Total Lymphocyte: 18.9 %
WBC mixed population: 462 cells/uL (ref 200–950)
WBC: 5.5 10*3/uL (ref 3.8–10.8)

## 2017-10-13 LAB — VITAMIN D 25 HYDROXY (VIT D DEFICIENCY, FRACTURES): Vit D, 25-Hydroxy: 50 ng/mL (ref 30–100)

## 2017-10-13 LAB — TSH: TSH: 1.56 mIU/L

## 2017-10-13 LAB — COMPREHENSIVE METABOLIC PANEL
AG Ratio: 1.5 (calc) (ref 1.0–2.5)
ALT: 13 U/L (ref 6–29)
AST: 18 U/L (ref 10–35)
Albumin: 4.1 g/dL (ref 3.6–5.1)
Alkaline phosphatase (APISO): 77 U/L (ref 33–130)
BUN: 18 mg/dL (ref 7–25)
CO2: 29 mmol/L (ref 20–32)
Calcium: 9.3 mg/dL (ref 8.6–10.4)
Chloride: 104 mmol/L (ref 98–110)
Creat: 1.01 mg/dL (ref 0.50–1.05)
Globulin: 2.7 g/dL (calc) (ref 1.9–3.7)
Glucose, Bld: 89 mg/dL (ref 65–99)
Potassium: 5 mmol/L (ref 3.5–5.3)
Sodium: 140 mmol/L (ref 135–146)
Total Bilirubin: 0.4 mg/dL (ref 0.2–1.2)
Total Protein: 6.8 g/dL (ref 6.1–8.1)

## 2017-10-13 LAB — VITAMIN B12: Vitamin B-12: 634 pg/mL (ref 200–1100)

## 2017-10-19 DIAGNOSIS — G5603 Carpal tunnel syndrome, bilateral upper limbs: Secondary | ICD-10-CM | POA: Diagnosis not present

## 2017-10-28 DIAGNOSIS — G5601 Carpal tunnel syndrome, right upper limb: Secondary | ICD-10-CM | POA: Diagnosis not present

## 2017-10-28 DIAGNOSIS — G5602 Carpal tunnel syndrome, left upper limb: Secondary | ICD-10-CM | POA: Diagnosis not present

## 2017-11-02 ENCOUNTER — Other Ambulatory Visit (INDEPENDENT_AMBULATORY_CARE_PROVIDER_SITE_OTHER): Payer: BLUE CROSS/BLUE SHIELD

## 2017-11-02 ENCOUNTER — Encounter (INDEPENDENT_AMBULATORY_CARE_PROVIDER_SITE_OTHER): Payer: Self-pay | Admitting: Vascular Surgery

## 2017-11-02 ENCOUNTER — Ambulatory Visit (INDEPENDENT_AMBULATORY_CARE_PROVIDER_SITE_OTHER): Payer: BLUE CROSS/BLUE SHIELD | Admitting: Vascular Surgery

## 2017-11-02 VITALS — BP 120/77 | HR 64 | Resp 16 | Ht 63.0 in | Wt 134.0 lb

## 2017-11-02 DIAGNOSIS — H93A9 Pulsatile tinnitus, unspecified ear: Secondary | ICD-10-CM

## 2017-11-02 DIAGNOSIS — I150 Renovascular hypertension: Secondary | ICD-10-CM | POA: Insufficient documentation

## 2017-11-02 DIAGNOSIS — I722 Aneurysm of renal artery: Secondary | ICD-10-CM

## 2017-11-02 DIAGNOSIS — G5602 Carpal tunnel syndrome, left upper limb: Secondary | ICD-10-CM | POA: Diagnosis not present

## 2017-11-02 NOTE — Assessment & Plan Note (Signed)
Her renal artery duplex today demonstrates an atrophic right kidney although there is a small polar right renal branch providing a small amount of blood flow to that kidney.  The left kidney has normal sizes in the left renal artery has normal velocities with no aneurysms. Her blood pressure control is markedly improved.  She is going to start backing off of her antihypertensives which I think is reasonable.  We will check a renal artery duplex in 1 year.
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
## 2017-11-02 NOTE — Assessment & Plan Note (Signed)
Carotid duplex was normal.  Recheck as needed

## 2017-11-02 NOTE — Assessment & Plan Note (Signed)
right kidney although there is a small polar right renal branch providing a small amount of blood flow to that kidney.  The left kidney has normal sizes in the left renal artery has normal velocities with no aneurysms. Her blood pressure control is markedly improved.  She is going to start backing off of her antihypertensives which I think is reasonable.  We will check a renal artery duplex in 1 year.

## 2017-11-12 DIAGNOSIS — Z86718 Personal history of other venous thrombosis and embolism: Secondary | ICD-10-CM | POA: Diagnosis not present

## 2017-11-12 DIAGNOSIS — Z7901 Long term (current) use of anticoagulants: Secondary | ICD-10-CM | POA: Diagnosis not present

## 2017-11-12 DIAGNOSIS — G5602 Carpal tunnel syndrome, left upper limb: Secondary | ICD-10-CM | POA: Diagnosis not present

## 2018-01-03 ENCOUNTER — Telehealth: Payer: Self-pay | Admitting: Physician Assistant

## 2018-01-03 NOTE — Telephone Encounter (Signed)
So there is a recall on losartan but it is only 10 lots and only one manufacturer. She would need to contact her pharmacy to see if it was included. There is no recall on amlodipine alone.  If her BP has been running good I would recommend her cut losartan in half x 1 week, monitor for BP increases, if ok, then discontinue and monitor for BP changes.   If BP still doing well she can then cut amlodipine in half x 1 week, monitor BP, then discontinue if still normal BP.

## 2018-01-03 NOTE — Telephone Encounter (Signed)
Patient advised as directed below. Per patient she is going to call the pharmacy and if her Losartan is one of the recalls that she is going to call us back to let us know.  Thanks,  -Joseline

## 2018-01-03 NOTE — Telephone Encounter (Signed)
Please review.  Thanks,  -Joseline 

## 2018-01-03 NOTE — Telephone Encounter (Signed)
Patient thinks that the following medications may have a recall on them.  She would like to know if that is true and if so she would like something different.  She states that they have to be easy on her kidneys and there is one that she has taken in the past that made her cough.  She would also like to know if she could have a lower dose because she would like to come off of these medications.  She uses [facility].  losartan (COZAAR) 100 MG tablet  amLODipine (NORVASC) 5 MG tablet

## 2018-01-27 DIAGNOSIS — M65312 Trigger thumb, left thumb: Secondary | ICD-10-CM | POA: Diagnosis not present

## 2018-04-06 DIAGNOSIS — L821 Other seborrheic keratosis: Secondary | ICD-10-CM | POA: Diagnosis not present

## 2018-04-06 DIAGNOSIS — L578 Other skin changes due to chronic exposure to nonionizing radiation: Secondary | ICD-10-CM | POA: Diagnosis not present

## 2018-04-06 DIAGNOSIS — L820 Inflamed seborrheic keratosis: Secondary | ICD-10-CM | POA: Diagnosis not present

## 2018-04-06 DIAGNOSIS — D692 Other nonthrombocytopenic purpura: Secondary | ICD-10-CM | POA: Diagnosis not present

## 2018-04-06 DIAGNOSIS — L570 Actinic keratosis: Secondary | ICD-10-CM | POA: Diagnosis not present

## 2018-04-06 DIAGNOSIS — I788 Other diseases of capillaries: Secondary | ICD-10-CM | POA: Diagnosis not present

## 2018-06-23 DIAGNOSIS — I10 Essential (primary) hypertension: Secondary | ICD-10-CM | POA: Diagnosis not present

## 2018-06-23 DIAGNOSIS — N280 Ischemia and infarction of kidney: Secondary | ICD-10-CM | POA: Diagnosis not present

## 2018-06-23 DIAGNOSIS — R809 Proteinuria, unspecified: Secondary | ICD-10-CM | POA: Diagnosis not present

## 2018-06-27 ENCOUNTER — Other Ambulatory Visit: Payer: Self-pay | Admitting: Physician Assistant

## 2018-06-27 DIAGNOSIS — I150 Renovascular hypertension: Secondary | ICD-10-CM

## 2018-06-27 DIAGNOSIS — I119 Hypertensive heart disease without heart failure: Secondary | ICD-10-CM

## 2018-08-10 ENCOUNTER — Other Ambulatory Visit: Payer: Self-pay

## 2018-08-10 DIAGNOSIS — I119 Hypertensive heart disease without heart failure: Secondary | ICD-10-CM

## 2018-08-10 MED ORDER — AMLODIPINE BESYLATE 5 MG PO TABS: 5.0000 mg | ORAL_TABLET | Freq: Every evening | ORAL | 1 refills | Status: DC

## 2018-10-10 ENCOUNTER — Other Ambulatory Visit: Payer: Self-pay | Admitting: Physician Assistant

## 2018-10-10 DIAGNOSIS — Z1231 Encounter for screening mammogram for malignant neoplasm of breast: Secondary | ICD-10-CM

## 2018-10-27 DIAGNOSIS — L820 Inflamed seborrheic keratosis: Secondary | ICD-10-CM | POA: Diagnosis not present

## 2018-10-27 DIAGNOSIS — Z1283 Encounter for screening for malignant neoplasm of skin: Secondary | ICD-10-CM | POA: Diagnosis not present

## 2018-10-27 DIAGNOSIS — D229 Melanocytic nevi, unspecified: Secondary | ICD-10-CM | POA: Diagnosis not present

## 2018-10-27 DIAGNOSIS — L821 Other seborrheic keratosis: Secondary | ICD-10-CM | POA: Diagnosis not present

## 2018-10-27 DIAGNOSIS — L570 Actinic keratosis: Secondary | ICD-10-CM | POA: Diagnosis not present

## 2018-10-27 DIAGNOSIS — Z85828 Personal history of other malignant neoplasm of skin: Secondary | ICD-10-CM | POA: Diagnosis not present

## 2018-10-28 ENCOUNTER — Ambulatory Visit
Admission: RE | Admit: 2018-10-28 | Discharge: 2018-10-28 | Disposition: A | Discharge status: Home / Self Care | Payer: BLUE CROSS/BLUE SHIELD | Source: Ambulatory Visit | Attending: Physician Assistant | Admitting: Physician Assistant

## 2018-10-28 DIAGNOSIS — Z1231 Encounter for screening mammogram for malignant neoplasm of breast: Secondary | ICD-10-CM | POA: Diagnosis not present

## 2018-10-31 ENCOUNTER — Telehealth: Payer: Self-pay

## 2018-10-31 NOTE — Telephone Encounter (Signed)
LMTCB

## 2018-10-31 NOTE — Telephone Encounter (Signed)
Patient advised as directed below. 

## 2018-10-31 NOTE — Telephone Encounter (Signed)
-----   Message from [name], [place] sent at 10/31/2018  8:06 AM EST ----- Normal mammogram. Repeat screening in one year.

## 2018-11-01 ENCOUNTER — Ambulatory Visit (INDEPENDENT_AMBULATORY_CARE_PROVIDER_SITE_OTHER): Payer: BLUE CROSS/BLUE SHIELD | Admitting: Vascular Surgery

## 2018-11-01 ENCOUNTER — Encounter (INDEPENDENT_AMBULATORY_CARE_PROVIDER_SITE_OTHER): Payer: BLUE CROSS/BLUE SHIELD

## 2018-11-01 ENCOUNTER — Encounter (INDEPENDENT_AMBULATORY_CARE_PROVIDER_SITE_OTHER): Payer: Self-pay | Admitting: Vascular Surgery

## 2018-11-01 ENCOUNTER — Ambulatory Visit (INDEPENDENT_AMBULATORY_CARE_PROVIDER_SITE_OTHER): Payer: BLUE CROSS/BLUE SHIELD

## 2018-11-01 VITALS — BP 121/78 | HR 60 | Resp 16 | Ht 63.0 in | Wt 135.0 lb

## 2018-11-01 DIAGNOSIS — H93A9 Pulsatile tinnitus, unspecified ear: Secondary | ICD-10-CM

## 2018-11-01 DIAGNOSIS — I722 Aneurysm of renal artery: Secondary | ICD-10-CM

## 2018-11-01 DIAGNOSIS — I150 Renovascular hypertension: Secondary | ICD-10-CM | POA: Diagnosis not present

## 2018-11-01 NOTE — Assessment & Plan Note (Signed)
Her duplex today shows no hemodynamically significant stenosis in the left renal artery and no sign of aneurysm in the left renal artery.  The right kidney is atrophic with no flow in the main renal artery although previously a polar renal artery has been demonstrated on the right.  Symptomatically Natalie Dickerson is doing well.  Her blood pressure is well controlled with losartan.  Recheck renal duplex in 1 year

## 2018-11-01 NOTE — Assessment & Plan Note (Signed)
Better after treatment for her renal artery aneurysm

## 2018-11-01 NOTE — Progress Notes (Signed)
MRN : [id number]  Natalie Dickerson is a 57 y.o. (1961/10/12) female who presents with chief complaint of  Chief Complaint  Patient presents with  . Follow-up    ultrasound follow up  .  History of Present Illness: Patient returns today in follow up of renovascular hypertension and's previous treatment for right renal artery aneurysms.  She underwent extensive percutaneous treatment but has had stent occlusion and now essentially has a solitary left kidney.  Her duplex today shows no hemodynamically significant stenosis in the left renal artery and no sign of aneurysm in the left renal artery.  The right kidney is atrophic with no flow in the main renal artery although previously a polar renal artery has been demonstrated on the right.  She is on losartan but her blood pressure control is excellent.  Current Outpatient Medications  Medication Sig Dispense Refill  . amLODipine (NORVASC) 5 MG tablet Take 1 tablet (5 mg total) by mouth every evening. 90 tablet 1  . aspirin EC 81 MG EC tablet Take 1 tablet (81 mg total) by mouth daily. 90 tablet 3  . cholecalciferol (VITAMIN D) 1000 units tablet Take 1,000 Units by mouth daily.    . Flaxseed, Linseed, (EQL FLAXSEED OIL) 1200 MG CAPS Take daily by mouth.    . losartan (COZAAR) 100 MG tablet TAKE 1 TABLET BY MOUTH  DAILY 90 tablet 1  . Multiple Vitamins-Minerals (MULTIVITAMIN WITH MINERALS) tablet Take 1 tablet by mouth daily.    . Omega-3 Fatty Acids (FISH OIL) 1000 MG CAPS Take 1 capsule by mouth daily.    Marland Kitchen pyridOXINE (VITAMIN B-6) 100 MG tablet Take 100 mg daily by mouth.    . vitamin B-12 (CYANOCOBALAMIN) 1000 MCG tablet Take 1,000 mcg by mouth daily.     No current facility-administered medications for this visit.     Past Medical History:  Diagnosis Date  . Cancer ([place])    skin ca    Past Surgical History:  Procedure Laterality Date  . EMBOLIZATION N/A 03/24/2017   Procedure: Embolization;  Surgeon: Algernon Huxley, MD;  Location:  [place] [facility];  Service: Cardiovascular;  Laterality: N/A;  . FINGER SURGERY    . PERIPHERAL VASCULAR CATHETERIZATION N/A 01/18/2017   Procedure: Renal Angiography;  Surgeon: Algernon Huxley, MD;  Location: [place] [facility];  Service: Cardiovascular;  Laterality: N/A;  . PERIPHERAL VASCULAR CATHETERIZATION  01/18/2017   Procedure: Renal Intervention;  Surgeon: Algernon Huxley, MD;  Location: [place] [facility];  Service: Cardiovascular;;  . RENAL ANGIOGRAPHY N/A 03/24/2017   Procedure: Renal Angiography;  Surgeon: Algernon Huxley, MD;  Location: [place] [facility];  Service: Cardiovascular;  Laterality: N/A;   Family History  Problem Relation Age of Onset  . Hypertension Mother   . Breast cancer Mother   . Cancer Mother   . Breast cancer Father   No bleeding disorders or clotting disorders  Social History        Social History   Substance Use Topics   . Smoking status: Former Research scientist (life sciences)   . Smokeless tobacco: Never Used      Comment: Smoked for about 5 years, smoked about 1 pack per day. Quit in 1984   . Alcohol use Yes     Comment: Occasional alcohol use; Drinks 2-4 glasses of wine per week.   No IVDU  No Known Allergies     REVIEW OF SYSTEMS(Negative unless checked)  Constitutional: [] Weight loss[] Fever[] Chills Cardiac:[] Chest pain[] Chest pressure[] Palpitations [] Shortness  of breath when laying flat [] Shortness of breath at rest [] Shortness of breath with exertion. Vascular: [] Pain in legs with walking[] Pain in legsat rest[] Pain in legs when laying flat [] Claudication [] Pain in feet when walking [] Pain in feet at rest [] Pain in feet when laying flat [] History of DVT [] Phlebitis [] Swelling in legs [] Varicose veins [] Non-healing ulcers Pulmonary: [] Uses home oxygen [] Productive cough[] Hemoptysis [] Wheeze [] COPD [] Asthma Neurologic: [] Dizziness [] Blackouts [] Seizures [] History of stroke  [] History of TIA[] Aphasia [] Temporary blindness[] Dysphagia [] Weaknessor numbness in arms [] Weakness or numbnessin legs Musculoskeletal: [x] Arthritis [] Joint swelling [] Joint pain [x] Low back pain Hematologic:[] Easy bruising[] Easy bleeding [] Hypercoagulable state [] Anemic [] Hepatitis Gastrointestinal:[] Blood in stool[] Vomiting blood[] Gastroesophageal reflux/heartburn[] Abdominal pain Genitourinary: [] Chronic kidney disease [] Difficulturination [] Frequenturination [] Burning with urination[] Hematuria Skin: [] Rashes [] Ulcers [] Wounds Psychological: [] History of anxiety[] History of major depression.     Physical Examination  BP 121/78 (BP Location: Right Arm)   Pulse 60   Resp 16   Ht 5\' 3"  (1.6 m)   Wt 135 lb (61.2 kg)   BMI 23.91 kg/m  Gen:  WD/WN, NAD.  Appears younger than stated age Head: [place]/AT, No temporalis wasting. Ear/Nose/Throat: Hearing grossly intact, nares w/o erythema or drainage Eyes: Conjunctiva clear. Sclera non-icteric Neck: Supple.  Trachea midline Pulmonary:  Good air movement, no use of accessory muscles.  Cardiac: RRR, no JVD Vascular:  Vessel Right Left  Radial Palpable Palpable                                   Gastrointestinal: soft, non-tender/non-distended. No guarding/reflex.  Musculoskeletal: M/S 5/5 throughout.  No deformity or atrophy.  No edema. Neurologic: Sensation grossly intact in extremities.  Symmetrical.  Speech is fluent.  Psychiatric: Judgment intact, Mood & affect appropriate for pt's clinical situation. Dermatologic: No rashes or ulcers noted.  No cellulitis or open wounds.       Labs No results found for this or any previous visit (from the past 2160 hour(s)).  Radiology Mm 3d Screen Breast Bilateral  Result Date: 10/28/2018 CLINICAL DATA:  Screening. EXAM: DIGITAL SCREENING BILATERAL MAMMOGRAM WITH TOMO AND CAD COMPARISON:  Previous exam(s). ACR Breast Density  Category b: There are scattered areas of fibroglandular density. FINDINGS: There are no findings suspicious for malignancy. Images were processed with CAD. IMPRESSION: No mammographic evidence of malignancy. A result letter of this screening mammogram will be mailed directly to the patient. RECOMMENDATION: Screening mammogram in one year. (Code:[id number]-01Y) BI-RADS CATEGORY  1: Negative. Electronically Signed   By: Curlene Dolphin M.D.   On: 10/28/2018 16:37    Assessment/Plan Pulsatile tinnitus Carotid duplex was normal.  Recheck as needed  Renovascular hypertension Better after treatment for her renal artery aneurysm  Renal artery aneurysm of native kidney [facility]) Her duplex today shows no hemodynamically significant stenosis in the left renal artery and no sign of aneurysm in the left renal artery.  The right kidney is atrophic with no flow in the main renal artery although previously a polar renal artery has been demonstrated on the right.  Symptomatically she is doing well.  Her blood pressure is well controlled with losartan.  Recheck renal duplex in 1 year    Leotis Pain, MD  11/01/2018 11:16 AM    This note was created with Dragon medical transcription system.  Any errors from dictation are purely unintentional

## 2018-11-02 NOTE — Progress Notes (Signed)
[name], Female    DOB: 1961-12-04, 57 y.o.   MRN: [id number] Visit Date: 11/04/2018  Today's Provider: [name], PA-C   Chief Complaint  Patient presents with  . Annual Exam   Subjective:    Annual physical exam Natalie Dickerson is a 57 y.o. female who presents today for health maintenance and complete physical. She feels well. She reports exercising none. She reports she is sleeping fairly well.  Pap: 11/02/16-Normal Mammogram:10/28/18-Normal Colonoscopy:08/03/14 ----------------------------------------------------------------- Patient Declined Influenza Vaccine.  Review of Systems  Constitutional: Negative.   HENT: Negative.   Eyes: Negative.   Respiratory: Positive for cough.   Cardiovascular: Negative.   Gastrointestinal: Negative.   Endocrine: Negative.   Genitourinary: Negative.   Musculoskeletal: Negative.   Skin: Negative.   Allergic/Immunologic: Negative.   Neurological: Negative.   Hematological: Negative.   Psychiatric/Behavioral: Negative.     Social History      She  reports that she has quit smoking. She quit after 5.00 years of use. She has never used smokeless tobacco. She reports that she drinks alcohol. She reports that she does not use drugs.       Social History   Socioeconomic History  . Marital status: Married    Spouse name: Not on file  . Number of children: 2  . Years of education: Not on file  . Highest education level: Not on file  Occupational History  . Not on file  Social Needs  . Financial resource strain: Not on file  . Food insecurity:    Worry: Not on file    Inability: Not on file  . Transportation needs:    Medical: Not on file    Non-medical: Not on file  Tobacco Use  . Smoking status: Former Smoker    Years: 5.00  . Smokeless tobacco: Never Used  . Tobacco comment: Smoked for about 5 years, smoked about 1 pack per day. Quit in 1984  Substance and Sexual Activity  . Alcohol use: Yes     Comment: Occasional alcohol use; Drinks 2-4 glasses of wine per week.  . Drug use: No  . Sexual activity: Not on file  Lifestyle  . Physical activity:    Days per week: Not on file    Minutes per session: Not on file  . Stress: Not on file  Relationships  . Social connections:    Talks on phone: Not on file    Gets together: Not on file    Attends religious service: Not on file    Active member of club or organization: Not on file    Attends meetings of clubs or organizations: Not on file    Relationship status: Not on file  Other Topics Concern  . Not on file  Social History Narrative  . Not on file    Past Medical History:  Diagnosis Date  . Cancer [facility])    skin ca     Patient Active Problem List   Diagnosis Date Noted  . Renovascular hypertension 11/02/2017  . Pulsatile tinnitus 04/27/2017  . Acute renal artery occlusion (HCC)   . Renal infarction ([place])   . Malignant HTN with heart disease, w/o CHF, w/o chronic kidney disease 03/23/2017  . Renal artery aneurysm of native kidney ([place]) 12/29/2016  . Pulmonary granulomatosis 12/29/2016  . Avitaminosis D 10/25/2015  . B12 deficiency 10/25/2015  . History of chicken pox 10/22/2015  . Uterine fibroid 10/22/2015  . Basal cell carcinoma  of face 09/27/2013  . Cervical pain 07/19/2008  . Irregular bleeding 07/19/2008    Past Surgical History:  Procedure Laterality Date  . EMBOLIZATION N/A 03/24/2017   Procedure: Embolization;  Surgeon: Algernon Huxley, MD;  Location: [place] [facility];  Service: Cardiovascular;  Laterality: N/A;  . FINGER SURGERY    . PERIPHERAL VASCULAR CATHETERIZATION N/A 01/18/2017   Procedure: Renal Angiography;  Surgeon: Algernon Huxley, MD;  Location: [place] [facility];  Service: Cardiovascular;  Laterality: N/A;  . PERIPHERAL VASCULAR CATHETERIZATION  01/18/2017   Procedure: Renal Intervention;  Surgeon: Algernon Huxley, MD;  Location: [place] [facility];  Service: Cardiovascular;;  . RENAL  ANGIOGRAPHY N/A 03/24/2017   Procedure: Renal Angiography;  Surgeon: Algernon Huxley, MD;  Location: [place] [facility];  Service: Cardiovascular;  Laterality: N/A;    Family History        Family Status  Relation Name Status  . Mother  Alive  . Father  Deceased at age 49       died from breast cancer   . MGM  Deceased  . MGF  Deceased  . PGM  Deceased  . PGF  Deceased  . Sister  Alive  . Brother  Alive  . Brother  Alive  . Son  Alive  . Son  Alive        Her family history includes Breast cancer (age of onset: 45) in her father; Breast cancer (age of onset: 24) in her mother; Cancer in her mother; Hypertension in her brother and mother.      Allergies  Allergen Reactions  . Bee Venom Anaphylaxis  . Ivp Dye [Iodinated Diagnostic Agents] Rash     Current Outpatient Medications:  .  amLODipine (NORVASC) 5 MG tablet, Take 1 tablet (5 mg total) by mouth every evening., Disp: 90 tablet, Rfl: 1 .  aspirin EC 81 MG EC tablet, Take 1 tablet (81 mg total) by mouth daily., Disp: 90 tablet, Rfl: 3 .  cholecalciferol (VITAMIN D) 1000 units tablet, Take 1,000 Units by mouth daily., Disp: , Rfl:  .  Flaxseed, Linseed, (EQL FLAXSEED OIL) 1200 MG CAPS, Take daily by mouth., Disp: , Rfl:  .  losartan (COZAAR) 100 MG tablet, TAKE 1 TABLET BY MOUTH  DAILY, Disp: 90 tablet, Rfl: 1 .  Multiple Vitamins-Minerals (MULTIVITAMIN WITH MINERALS) tablet, Take 1 tablet by mouth daily., Disp: , Rfl:  .  Omega-3 Fatty Acids (FISH OIL) 1000 MG CAPS, Take 1 capsule by mouth daily., Disp: , Rfl:  .  pyridOXINE (VITAMIN B-6) 100 MG tablet, Take 100 mg daily by mouth., Disp: , Rfl:  .  vitamin B-12 (CYANOCOBALAMIN) 1000 MCG tablet, Take 1,000 mcg by mouth daily., Disp: , Rfl:    Patient Care Team: [name], PA-C as PCP - General (Family Medicine)      Objective:   Vitals: BP 108/70 (BP Location: Left Arm, Patient Position: Sitting, Cuff Size: Normal)   Pulse 62   Temp 98.4 F (36.9 C) (Oral)    Resp 16   Ht 5\' 3"  (1.6 m)   Wt 135 lb 3.2 oz (61.3 kg)   BMI 23.95 kg/m    Vitals:   11/04/18 1353  BP: 108/70  Pulse: 62  Resp: 16  Temp: 98.4 F (36.9 C)  TempSrc: Oral  Weight: 135 lb 3.2 oz (61.3 kg)  Height: 5\' 3"  (1.6 m)     Physical Exam  Constitutional: She is oriented to person, place,  and time. She appears well-developed and well-nourished. No distress.  HENT:  Head: Normocephalic and atraumatic.  Right Ear: Hearing, tympanic membrane, external ear and ear canal normal.  Left Ear: Hearing, tympanic membrane, external ear and ear canal normal.  Nose: Nose normal.  Mouth/Throat: Uvula is midline, oropharynx is clear and moist and mucous membranes are normal. No oropharyngeal exudate.  Eyes: Pupils are equal, round, and reactive to light. Conjunctivae and EOM are normal. Right eye exhibits no discharge. Left eye exhibits no discharge. No scleral icterus.  Neck: Normal range of motion. Neck supple. No JVD present. Carotid bruit is not present. No tracheal deviation present. No thyromegaly present.  Cardiovascular: Normal rate, regular rhythm, normal heart sounds and intact distal pulses. Exam reveals no gallop and no friction rub.  No murmur heard. Pulmonary/Chest: Effort normal and breath sounds normal. No respiratory distress. She has no wheezes. She has no rales. She exhibits no tenderness.  Abdominal: Soft. Bowel sounds are normal. She exhibits no distension and no mass. There is no tenderness. There is no rebound and no guarding.  Musculoskeletal: Normal range of motion. She exhibits no edema or tenderness.  Lymphadenopathy:    She has no cervical adenopathy.  Neurological: She is alert and oriented to person, place, and time.  Skin: Skin is warm and dry. No rash noted. She is not diaphoretic.  Psychiatric: She has a normal mood and affect. Her behavior is normal. Judgment and thought content normal.  Vitals reviewed.    Depression Screen PHQ 2/9 Scores  11/04/2018 10/11/2017  PHQ - 2 Score 0 0  PHQ- 9 Score - 2      Assessment & Plan:     Routine Health Maintenance and Physical Exam  Exercise Activities and Dietary recommendations Goals   None     Immunization History  Administered Date(s) Administered  . Td 07/13/2016  . Tdap 08/16/2008    Health Maintenance  Topic Date Due  . INFLUENZA VACCINE  07/28/2018  . COLONOSCOPY  08/04/2019  . PAP SMEAR  10/29/2019  . MAMMOGRAM  10/28/2020  . TETANUS/TDAP  07/13/2026  . Hepatitis C Screening  Completed  . HIV Screening  Completed     Discussed health benefits of physical activity, and encouraged her to engage in regular exercise appropriate for her age and condition.   1. Annual physical exam Normal physical exam today. Will check labs as below and f/u pending lab results. If labs are stable and WNL she will not need to have these rechecked for one year at her next annual physical exam. She is to call the office in the meantime if she has any acute issue, questions or concerns. - CBC with Differential/Platelet - Comprehensive metabolic panel - Hemoglobin A1c - TSH - Lipid panel  2. Encounter for lipid screening for cardiovascular disease Will check labs as below and f/u pending results. - CBC with Differential/Platelet - Comprehensive metabolic panel - Hemoglobin A1c - TSH - Lipid panel  3. Acute renal artery occlusion [facility]) Doing well clinically. Followed by Dr. Lucky Cowboy, vascular. Was seen last week for repeat renal duplex. Stable. BP doing well.  - CBC with Differential/Platelet - Comprehensive metabolic panel - Hemoglobin A1c - TSH - Lipid panel  4. History of malignant hypertension Secondary to right renal artery occlusion. Stable. Continue losartan 50mg  and amlodipine 5mg . Will check labs as below and f/u pending results. - CBC with Differential/Platelet - Comprehensive metabolic panel - Hemoglobin A1c - TSH - Lipid panel  5. Renal infarction  (  [place]) Stent placed for RAA. Stent occluded. Will check labs as below and f/u pending results. Left kidney normal.  - CBC with Differential/Platelet - Comprehensive metabolic panel - Hemoglobin A1c - TSH - Lipid panel  6. Renovascular hypertension Secondary to RAA that had occluded stent. Stable renal duplex. Followed by Dr. Lucky Cowboy. Will check labs as below and f/u pending results. - CBC with Differential/Platelet - Comprehensive metabolic panel - Hemoglobin A1c - TSH - Lipid panel - losartan (COZAAR) 50 MG tablet; Take 1 tablet (50 mg total) by mouth daily.  Dispense: 90 tablet; Refill: 3 - amLODipine (NORVASC) 5 MG tablet; Take 1 tablet (5 mg total) by mouth every evening.  Dispense: 90 tablet; Refill: 1  7. Avitaminosis D On Vit D 1000 IU daily. Will check labs as below and f/u pending results. - CBC with Differential/Platelet - Comprehensive metabolic panel - Hemoglobin A1c - TSH - Lipid panel - Vitamin D (25 hydroxy)  8. B12 deficiency On B12 1019mcg daily. Will check labs as below and f/u pending results. - CBC with Differential/Platelet - Comprehensive metabolic panel - Hemoglobin A1c - TSH - Lipid panel - B12  9. Pulmonary granulomatosis Had two scans in 2017 and 2018 that showed stable bilateral pulmonary granulomas. Patient does have distant smoking history of 1 ppd x 10 years (in her teens and early 42s). No current other environmental risk factors. Will rescan in late 2020. At that time it will have been 5 years since found. If still stable should be able to not have to re-image.    11. Refused influenza vaccine   --------------------------------------------------------------------    [name], PA-C  [facility]

## 2018-11-04 ENCOUNTER — Encounter: Payer: Self-pay | Admitting: Physician Assistant

## 2018-11-04 ENCOUNTER — Ambulatory Visit (INDEPENDENT_AMBULATORY_CARE_PROVIDER_SITE_OTHER): Payer: BLUE CROSS/BLUE SHIELD | Admitting: Physician Assistant

## 2018-11-04 VITALS — BP 108/70 | HR 62 | Temp 98.4°F | Resp 16 | Ht 63.0 in | Wt 135.2 lb

## 2018-11-04 DIAGNOSIS — E559 Vitamin D deficiency, unspecified: Secondary | ICD-10-CM

## 2018-11-04 DIAGNOSIS — I150 Renovascular hypertension: Secondary | ICD-10-CM

## 2018-11-04 DIAGNOSIS — E538 Deficiency of other specified B group vitamins: Secondary | ICD-10-CM | POA: Diagnosis not present

## 2018-11-04 DIAGNOSIS — Z136 Encounter for screening for cardiovascular disorders: Secondary | ICD-10-CM | POA: Diagnosis not present

## 2018-11-04 DIAGNOSIS — J984 Other disorders of lung: Secondary | ICD-10-CM

## 2018-11-04 DIAGNOSIS — Z0000 Encounter for general adult medical examination without abnormal findings: Secondary | ICD-10-CM

## 2018-11-04 DIAGNOSIS — Z2821 Immunization not carried out because of patient refusal: Secondary | ICD-10-CM

## 2018-11-04 DIAGNOSIS — Z13220 Encounter for screening for lipoid disorders: Secondary | ICD-10-CM

## 2018-11-04 DIAGNOSIS — N280 Ischemia and infarction of kidney: Secondary | ICD-10-CM | POA: Diagnosis not present

## 2018-11-04 DIAGNOSIS — J8410 Pulmonary fibrosis, unspecified: Secondary | ICD-10-CM

## 2018-11-04 DIAGNOSIS — I119 Hypertensive heart disease without heart failure: Secondary | ICD-10-CM

## 2018-11-04 DIAGNOSIS — Z8679 Personal history of other diseases of the circulatory system: Secondary | ICD-10-CM | POA: Diagnosis not present

## 2018-11-04 MED ORDER — LOSARTAN POTASSIUM 50 MG PO TABS: 50.0000 mg | ORAL_TABLET | Freq: Every day | ORAL | 3 refills | Status: DC

## 2018-11-04 MED ORDER — AMLODIPINE BESYLATE 5 MG PO TABS: 5.0000 mg | ORAL_TABLET | Freq: Every evening | ORAL | 1 refills | Status: DC

## 2018-11-05 LAB — CBC WITH DIFFERENTIAL/PLATELET
Basophils Absolute: 0.1 10*3/uL (ref 0.0–0.2)
Basos: 2 %
EOS (ABSOLUTE): 0.1 10*3/uL (ref 0.0–0.4)
Eos: 1 %
Hematocrit: 43 % (ref 34.0–46.6)
Hemoglobin: 14.3 g/dL (ref 11.1–15.9)
Immature Grans (Abs): 0 10*3/uL (ref 0.0–0.1)
Immature Granulocytes: 0 %
Lymphocytes Absolute: 1.6 10*3/uL (ref 0.7–3.1)
Lymphs: 29 %
MCH: 29.9 pg (ref 26.6–33.0)
MCHC: 33.3 g/dL (ref 31.5–35.7)
MCV: 90 fL (ref 79–97)
Monocytes Absolute: 0.4 10*3/uL (ref 0.1–0.9)
Monocytes: 8 %
Neutrophils Absolute: 3.3 10*3/uL (ref 1.4–7.0)
Neutrophils: 60 %
Platelets: 310 10*3/uL (ref 150–450)
RBC: 4.79 x10E6/uL (ref 3.77–5.28)
RDW: 11.8 % — ABNORMAL LOW (ref 12.3–15.4)
WBC: 5.5 10*3/uL (ref 3.4–10.8)

## 2018-11-05 LAB — COMPREHENSIVE METABOLIC PANEL
ALT: 18 IU/L (ref 0–32)
AST: 22 IU/L (ref 0–40)
Albumin/Globulin Ratio: 1.8 (ref 1.2–2.2)
Albumin: 4.6 g/dL (ref 3.5–5.5)
Alkaline Phosphatase: 91 IU/L (ref 39–117)
BUN/Creatinine Ratio: 16 (ref 9–23)
BUN: 14 mg/dL (ref 6–24)
Bilirubin Total: 0.4 mg/dL (ref 0.0–1.2)
CO2: 24 mmol/L (ref 20–29)
Calcium: 9.6 mg/dL (ref 8.7–10.2)
Chloride: 104 mmol/L (ref 96–106)
Creatinine, Ser: 0.87 mg/dL (ref 0.57–1.00)
GFR calc Af Amer: 87 mL/min/{1.73_m2} (ref >59)
GFR calc non Af Amer: 75 mL/min/{1.73_m2} (ref >59)
Globulin, Total: 2.6 g/dL (ref 1.5–4.5)
Glucose: 78 mg/dL (ref 65–99)
Potassium: 4.5 mmol/L (ref 3.5–5.2)
Sodium: 141 mmol/L (ref 134–144)
Total Protein: 7.2 g/dL (ref 6.0–8.5)

## 2018-11-05 LAB — LIPID PANEL
Chol/HDL Ratio: 2 ratio (ref 0.0–4.4)
Cholesterol, Total: 187 mg/dL (ref 100–199)
HDL: 92 mg/dL (ref >39)
LDL Calculated: 79 mg/dL (ref 0–99)
Triglycerides: 81 mg/dL (ref 0–149)
VLDL Cholesterol Cal: 16 mg/dL (ref 5–40)

## 2018-11-05 LAB — TSH: TSH: 2.04 u[IU]/mL (ref 0.450–4.500)

## 2018-11-05 LAB — VITAMIN B12: Vitamin B-12: 892 pg/mL (ref 232–1245)

## 2018-11-05 LAB — VITAMIN D 25 HYDROXY (VIT D DEFICIENCY, FRACTURES): Vit D, 25-Hydroxy: 40.8 ng/mL (ref 30.0–100.0)

## 2018-11-05 LAB — HEMOGLOBIN A1C
Est. average glucose Bld gHb Est-mCnc: 108 mg/dL
Hgb A1c MFr Bld: 5.4 % (ref 4.8–5.6)

## 2018-11-07 ENCOUNTER — Telehealth: Payer: Self-pay

## 2018-11-07 NOTE — Telephone Encounter (Signed)
-----   Message from [name], [place] sent at 11/07/2018  8:02 AM EST ----- All labs are within normal limits and stable.  Thanks! -JB

## 2018-11-07 NOTE — Telephone Encounter (Signed)
Patient advised as directed below. 

## 2018-11-07 NOTE — Telephone Encounter (Signed)
LVMTRC 

## 2018-11-21 ENCOUNTER — Other Ambulatory Visit: Payer: Self-pay | Admitting: Physician Assistant

## 2018-11-21 DIAGNOSIS — I150 Renovascular hypertension: Secondary | ICD-10-CM

## 2018-11-21 MED ORDER — LOSARTAN POTASSIUM 50 MG PO TABS: 50.0000 mg | ORAL_TABLET | Freq: Every day | ORAL | 3 refills | Status: DC

## 2018-11-21 NOTE — Telephone Encounter (Signed)
[place] faxed refill request for the following medications:  losartan (COZAAR) 50 MG tablet   90 day supply  Rx was sent to [facility] on 11/04/18. Please advise. Thanks TNP

## 2019-03-22 ENCOUNTER — Telehealth: Payer: Self-pay

## 2019-03-22 NOTE — Telephone Encounter (Signed)
Head cold, non productive cough. Heaviness in the chest, no fever.  Mostly at night.   Does not have computer with camera capability CB# [phone] [facility]

## 2019-03-22 NOTE — Telephone Encounter (Signed)
Please schedule patient for appt

## 2019-03-22 NOTE — Telephone Encounter (Signed)
Left patient a message to call back and schedule an appt.

## 2019-03-24 ENCOUNTER — Ambulatory Visit: Payer: BLUE CROSS/BLUE SHIELD | Admitting: Physician Assistant

## 2019-04-04 ENCOUNTER — Other Ambulatory Visit: Payer: Self-pay | Admitting: Physician Assistant

## 2019-04-04 DIAGNOSIS — I119 Hypertensive heart disease without heart failure: Secondary | ICD-10-CM

## 2019-07-10 DIAGNOSIS — L820 Inflamed seborrheic keratosis: Secondary | ICD-10-CM | POA: Diagnosis not present

## 2019-07-10 DIAGNOSIS — L821 Other seborrheic keratosis: Secondary | ICD-10-CM | POA: Diagnosis not present

## 2019-07-10 DIAGNOSIS — L578 Other skin changes due to chronic exposure to nonionizing radiation: Secondary | ICD-10-CM | POA: Diagnosis not present

## 2019-07-10 DIAGNOSIS — Z85828 Personal history of other malignant neoplasm of skin: Secondary | ICD-10-CM | POA: Diagnosis not present

## 2019-09-21 ENCOUNTER — Other Ambulatory Visit: Payer: Self-pay | Admitting: Physician Assistant

## 2019-09-21 DIAGNOSIS — I119 Hypertensive heart disease without heart failure: Secondary | ICD-10-CM

## 2019-09-21 DIAGNOSIS — I150 Renovascular hypertension: Secondary | ICD-10-CM

## 2019-10-17 ENCOUNTER — Other Ambulatory Visit: Payer: Self-pay | Admitting: Physician Assistant

## 2019-10-17 DIAGNOSIS — Z1231 Encounter for screening mammogram for malignant neoplasm of breast: Secondary | ICD-10-CM

## 2019-11-07 ENCOUNTER — Ambulatory Visit (INDEPENDENT_AMBULATORY_CARE_PROVIDER_SITE_OTHER): Payer: BLUE CROSS/BLUE SHIELD | Admitting: Vascular Surgery

## 2019-11-07 ENCOUNTER — Encounter (INDEPENDENT_AMBULATORY_CARE_PROVIDER_SITE_OTHER): Payer: BLUE CROSS/BLUE SHIELD

## 2019-11-08 NOTE — Progress Notes (Deleted)
{Method of visit:23308}   [name], Female    DOB: 02-Jun-1961, 58 y.o.   MRN: [id number] Visit Date: 11/08/2019  Today's Provider: [name], PA-C   No chief complaint on file.  Subjective:     Annual physical exam Natalie Dickerson is a 58 y.o. female who presents today for health maintenance and complete physical. She feels {DESC; WELL/FAIRLY WELL/POORLY:18703}. She reports exercising ***. She reports she is sleeping {DESC; WELL/FAIRLY WELL/POORLY:18703}.  ----------------------------------------------------------------- Mammogram scheduled 11/10/2019 Colonoscopy Performed on 08/03/14 with findings of adenomatous polyp. Repeat in 5 years. Performed by Dr. Allen Norris   11/02/2016 -Pap was normal this year  Review of Systems  Social History      She  reports that she has quit smoking. She quit after 5.00 years of use. She has never used smokeless tobacco. She reports current alcohol use. She reports that she does not use drugs.       Social History   Socioeconomic History  . Marital status: Married    Spouse name: Not on file  . Number of children: 2  . Years of education: Not on file  . Highest education level: Not on file  Occupational History  . Not on file  Social Needs  . Financial resource strain: Not on file  . Food insecurity    Worry: Not on file    Inability: Not on file  . Transportation needs    Medical: Not on file    Non-medical: Not on file  Tobacco Use  . Smoking status: Former Smoker    Years: 5.00  . Smokeless tobacco: Never Used  . Tobacco comment: Smoked for about 5 years, smoked about 1 pack per day. Quit in 1984  Substance and Sexual Activity  . Alcohol use: Yes    Comment: Occasional alcohol use; Drinks 2-4 glasses of wine per week.  . Drug use: No  . Sexual activity: Not on file  Lifestyle  . Physical activity    Days per week: Not on file    Minutes per session: Not on file  . Stress: Not on file  Relationships  .  Social Herbalist on phone: Not on file    Gets together: Not on file    Attends religious service: Not on file    Active member of club or organization: Not on file    Attends meetings of clubs or organizations: Not on file    Relationship status: Not on file  Other Topics Concern  . Not on file  Social History Narrative  . Not on file    Past Medical History:  Diagnosis Date  . Cancer ([place])    skin ca  . Hx of basal cell carcinoma 12/06/2013   Left sup forehead     Patient Active Problem List   Diagnosis Date Noted  . Renovascular hypertension 11/02/2017  . Pulsatile tinnitus 04/27/2017  . Acute renal artery occlusion (HCC)   . Renal infarction ([place])   . History of malignant hypertension 03/23/2017  . Renal artery aneurysm of native kidney ([place]) 12/29/2016  . Pulmonary granulomatosis 12/29/2016  . Avitaminosis D 10/25/2015  . B12 deficiency 10/25/2015  . History of chicken pox 10/22/2015  . Uterine fibroid 10/22/2015  . Basal cell carcinoma of face 09/27/2013  . Cervical pain 07/19/2008  . Irregular bleeding 07/19/2008    Past Surgical History:  Procedure Laterality Date  . EMBOLIZATION N/A 03/24/2017   Procedure: Embolization;  Surgeon:  Algernon Huxley, MD;  Location: [place] [facility];  Service: Cardiovascular;  Laterality: N/A;  . FINGER SURGERY    . PERIPHERAL VASCULAR CATHETERIZATION N/A 01/18/2017   Procedure: Renal Angiography;  Surgeon: Algernon Huxley, MD;  Location: [place] [facility];  Service: Cardiovascular;  Laterality: N/A;  . PERIPHERAL VASCULAR CATHETERIZATION  01/18/2017   Procedure: Renal Intervention;  Surgeon: Algernon Huxley, MD;  Location: [place] [facility];  Service: Cardiovascular;;  . RENAL ANGIOGRAPHY N/A 03/24/2017   Procedure: Renal Angiography;  Surgeon: Algernon Huxley, MD;  Location: [place] [facility];  Service: Cardiovascular;  Laterality: N/A;    Family History        Family Status  Relation Name Status  . Mother  Alive  .  Father  Deceased at age 79       died from breast cancer   . MGM  Deceased  . MGF  Deceased  . PGM  Deceased  . PGF  Deceased  . Sister  Alive  . Brother  Alive  . Brother  Alive  . Son  Alive  . Son  Alive        Her family history includes Breast cancer (age of onset: 58) in her father; Breast cancer (age of onset: 19) in her mother; Cancer in her mother; Hypertension in her brother and mother.      Allergies  Allergen Reactions  . Bee Venom Anaphylaxis  . Ivp Dye [Iodinated Diagnostic Agents] Rash     Current Outpatient Medications:  .  amLODipine (NORVASC) 5 MG tablet, TAKE 1 TABLET BY MOUTH  DAILY IN THE EVENING, Disp: 90 tablet, Rfl: 3 .  aspirin EC 81 MG EC tablet, Take 1 tablet (81 mg total) by mouth daily., Disp: 90 tablet, Rfl: 3 .  cholecalciferol (VITAMIN D) 1000 units tablet, Take 1,000 Units by mouth daily., Disp: , Rfl:  .  Flaxseed, Linseed, (EQL FLAXSEED OIL) 1200 MG CAPS, Take daily by mouth., Disp: , Rfl:  .  losartan (COZAAR) 50 MG tablet, TAKE 1 TABLET BY MOUTH  DAILY, Disp: 90 tablet, Rfl: 3 .  Multiple Vitamins-Minerals (MULTIVITAMIN WITH MINERALS) tablet, Take 1 tablet by mouth daily., Disp: , Rfl:  .  Omega-3 Fatty Acids (FISH OIL) 1000 MG CAPS, Take 1 capsule by mouth daily., Disp: , Rfl:  .  pyridOXINE (VITAMIN B-6) 100 MG tablet, Take 100 mg daily by mouth., Disp: , Rfl:  .  vitamin B-12 (CYANOCOBALAMIN) 1000 MCG tablet, Take 1,000 mcg by mouth daily., Disp: , Rfl:    Patient Care Team: Rubye Beach as PCP - General (Family Medicine)    Objective:    Vitals: There were no vitals taken for this visit.  There were no vitals filed for this visit.   Physical Exam   Depression Screen PHQ 2/9 Scores 11/04/2018 10/11/2017  PHQ - 2 Score 0 0  PHQ- 9 Score - 2       Assessment & Plan:     Routine Health Maintenance and Physical Exam  Exercise Activities and Dietary recommendations Goals   None     Immunization History   Administered Date(s) Administered  . Td 07/13/2016  . Tdap 08/16/2008    Health Maintenance  Topic Date Due  . INFLUENZA VACCINE  07/29/2019  . COLONOSCOPY  08/04/2019  . PAP SMEAR-Modifier  10/29/2019  . MAMMOGRAM  10/28/2020  . TETANUS/TDAP  07/13/2026  . Hepatitis C Screening  Completed  . HIV Screening  Completed     Discussed health benefits of physical activity, and encouraged her to engage in regular exercise appropriate for her age and condition.    --------------------------------------------------------------------    [name], PA-C  [facility]

## 2019-11-09 ENCOUNTER — Encounter: Payer: Self-pay | Admitting: Physician Assistant

## 2019-11-09 DIAGNOSIS — Z20828 Contact with and (suspected) exposure to other viral communicable diseases: Secondary | ICD-10-CM | POA: Diagnosis not present

## 2019-12-05 ENCOUNTER — Ambulatory Visit
Admission: RE | Admit: 2019-12-05 | Discharge: 2019-12-05 | Disposition: A | Discharge status: Home / Self Care | Payer: BC Managed Care – PPO | Source: Ambulatory Visit | Attending: Physician Assistant | Admitting: Physician Assistant

## 2019-12-05 DIAGNOSIS — Z1231 Encounter for screening mammogram for malignant neoplasm of breast: Secondary | ICD-10-CM | POA: Insufficient documentation

## 2019-12-06 ENCOUNTER — Telehealth: Payer: Self-pay

## 2019-12-06 NOTE — Telephone Encounter (Signed)
-----   Message from [name], [place] sent at 12/05/2019  4:52 PM EST ----- Normal mammogram. Repeat screening in one year.

## 2019-12-06 NOTE — Telephone Encounter (Signed)
Patient advised as below.  

## 2019-12-06 NOTE — Telephone Encounter (Signed)
LMTCB or that she can view Mammogram results through my chart. OK for [facility] to give results.

## 2019-12-18 NOTE — Progress Notes (Signed)
[name], Female    DOB: 01/05/1961, 58 y.o.   MRN: [id number] Visit Date: 12/20/2019  Today's Provider: [name], PA-C   Chief Complaint  Patient presents with  . Annual Exam   Subjective:     Annual physical exam Natalie Dickerson is a 58 y.o. female who presents today for health maintenance and complete physical. She feels well. She reports exercising some. She reports she is sleeping well. ----------------------------------------------------------------- Mammogram: 12/05/2019-Normal mammogram. Repeat screening in one year Colonoscopy: Performed on 08/03/14 with findings of adenomatous polyp. Repeat in 5 years. Performed by Dr. Allen Norris   Pap: 10/28/2016 Negative  Review of Systems  Constitutional: Negative.   HENT: Negative.   Eyes: Negative.   Respiratory: Negative.   Cardiovascular: Negative.   Gastrointestinal: Negative.   Endocrine: Negative.   Genitourinary: Negative.   Musculoskeletal: Negative.   Skin: Negative.   Allergic/Immunologic: Negative.   Neurological: Negative.   Hematological: Negative.   Psychiatric/Behavioral: Negative.     Social History      She  reports that she has quit smoking. She quit after 5.00 years of use. She has never used smokeless tobacco. She reports current alcohol use. She reports that she does not use drugs.       Social History   Socioeconomic History  . Marital status: Married    Spouse name: Not on file  . Number of children: 2  . Years of education: Not on file  . Highest education level: Not on file  Occupational History  . Not on file  Tobacco Use  . Smoking status: Former Smoker    Years: 5.00  . Smokeless tobacco: Never Used  . Tobacco comment: Smoked for about 5 years, smoked about 1 pack per day. Quit in 1984  Substance and Sexual Activity  . Alcohol use: Yes    Comment: Occasional alcohol use; Drinks 2-4 glasses of wine per week.  . Drug use: No  . Sexual activity: Not on file    Other Topics Concern  . Not on file  Social History Narrative  . Not on file   Social Determinants of Health   Financial Resource Strain:   . Difficulty of Paying Living Expenses: Not on file  Food Insecurity:   . Worried About Charity fundraiser in the Last Year: Not on file  . Ran Out of Food in the Last Year: Not on file  Transportation Needs:   . Lack of Transportation (Medical): Not on file  . Lack of Transportation (Non-Medical): Not on file  Physical Activity:   . Days of Exercise per Week: Not on file  . Minutes of Exercise per Session: Not on file  Stress:   . Feeling of Stress : Not on file  Social Connections:   . Frequency of Communication with Friends and Family: Not on file  . Frequency of Social Gatherings with Friends and Family: Not on file  . Attends Religious Services: Not on file  . Active Member of Clubs or Organizations: Not on file  . Attends Archivist Meetings: Not on file  . Marital Status: Not on file    Past Medical History:  Diagnosis Date  . Cancer ([place])    skin ca  . Hx of basal cell carcinoma 12/06/2013   Left sup forehead     Patient Active Problem List   Diagnosis Date Noted  . Renovascular hypertension 11/02/2017  . Pulsatile tinnitus 04/27/2017  .  Acute renal artery occlusion (HCC)   . Renal infarction ([place])   . History of malignant hypertension 03/23/2017  . Renal artery aneurysm of native kidney ([place]) 12/29/2016  . Pulmonary granulomatosis 12/29/2016  . Avitaminosis D 10/25/2015  . B12 deficiency 10/25/2015  . History of chicken pox 10/22/2015  . Uterine fibroid 10/22/2015  . Basal cell carcinoma of face 09/27/2013  . Cervical pain 07/19/2008  . Irregular bleeding 07/19/2008    Past Surgical History:  Procedure Laterality Date  . EMBOLIZATION N/A 03/24/2017   Procedure: Embolization;  Surgeon: Algernon Huxley, MD;  Location: [facility] [facility];  Service: Cardiovascular;  Laterality: N/A;  . FINGER SURGERY     . PERIPHERAL VASCULAR CATHETERIZATION N/A 01/18/2017   Procedure: Renal Angiography;  Surgeon: Algernon Huxley, MD;  Location: [place] [facility];  Service: Cardiovascular;  Laterality: N/A;  . PERIPHERAL VASCULAR CATHETERIZATION  01/18/2017   Procedure: Renal Intervention;  Surgeon: Algernon Huxley, MD;  Location: [facility];  Service: Cardiovascular;;  . RENAL ANGIOGRAPHY N/A 03/24/2017   Procedure: Renal Angiography;  Surgeon: Algernon Huxley, MD;  Location: [facility];  Service: Cardiovascular;  Laterality: N/A;    Family History        Family Status  Relation Name Status  . Mother  Alive  . Father  Deceased at age 76       died from breast cancer   . MGM  Deceased  . MGF  Deceased  . PGM  Deceased  . PGF  Deceased  . Sister  Alive  . Brother  Alive  . Brother  Alive  . Son  Alive  . Son  Alive        Her family history includes Breast cancer (age of onset: 43) in her father; Breast cancer (age of onset: 35) in her mother; Cancer in her mother; Hypertension in her brother and mother.      Allergies  Allergen Reactions  . Bee Venom Anaphylaxis  . Ivp Dye [Iodinated Diagnostic Agents] Rash     Current Outpatient Medications:  .  amLODipine (NORVASC) 5 MG tablet, TAKE 1 TABLET BY MOUTH  DAILY IN THE EVENING, Disp: 90 tablet, Rfl: 3 .  aspirin EC 81 MG EC tablet, Take 1 tablet (81 mg total) by mouth daily., Disp: 90 tablet, Rfl: 3 .  cholecalciferol (VITAMIN D) 1000 units tablet, Take 1,000 Units by mouth daily., Disp: , Rfl:  .  losartan (COZAAR) 50 MG tablet, TAKE 1 TABLET BY MOUTH  DAILY, Disp: 90 tablet, Rfl: 3 .  Multiple Vitamins-Minerals (MULTIVITAMIN WITH MINERALS) tablet, Take 1 tablet by mouth daily., Disp: , Rfl:  .  pyridOXINE (VITAMIN B-6) 100 MG tablet, Take 100 mg daily by mouth., Disp: , Rfl:  .  vitamin B-12 (CYANOCOBALAMIN) 1000 MCG tablet, Take 1,000 mcg by mouth daily., Disp: , Rfl:    Patient Care Team: Rubye Beach as PCP -  General (Family Medicine)    Objective:    Vitals: BP 121/81   Pulse 67   Temp (!) 96.9 F (36.1 C) (Temporal)   Resp 18   Ht 5\' 3"  (1.6 m)   Wt 140 lb (63.5 kg)   BMI 24.80 kg/m    Vitals:   12/20/19 1523  BP: 121/81  Pulse: 67  Resp: 18  Temp: (!) 96.9 F (36.1 C)  TempSrc: Temporal  Weight: 140 lb (63.5 kg)  Height: 5\' 3"  (1.6 m)  Physical Exam Vitals reviewed.  Constitutional:      General: She is not in acute distress.    Appearance: Normal appearance. She is well-developed and normal weight. She is not ill-appearing or diaphoretic.  HENT:     Head: Normocephalic and atraumatic.     Right Ear: Tympanic membrane, ear canal and external ear normal.     Left Ear: Tympanic membrane, ear canal and external ear normal.  Eyes:     General: No scleral icterus.       Right eye: No discharge.        Left eye: No discharge.     Extraocular Movements: Extraocular movements intact.     Conjunctiva/sclera: Conjunctivae normal.     Pupils: Pupils are equal, round, and reactive to light.  Neck:     Thyroid: No thyromegaly.     Vascular: No carotid bruit or JVD.     Trachea: No tracheal deviation.  Cardiovascular:     Rate and Rhythm: Normal rate and regular rhythm.     Pulses: Normal pulses.     Heart sounds: Normal heart sounds. No murmur. No friction rub. No gallop.   Pulmonary:     Effort: Pulmonary effort is normal. No respiratory distress.     Breath sounds: Normal breath sounds. No wheezing or rales.  Chest:     Chest wall: No tenderness.  Abdominal:     General: Abdomen is flat. Bowel sounds are normal. There is no distension.     Palpations: Abdomen is soft. There is no mass.     Tenderness: There is no abdominal tenderness. There is no guarding or rebound.  Musculoskeletal:        General: No tenderness. Normal range of motion.     Cervical back: Normal range of motion and neck supple.     Right lower leg: No edema.     Left lower leg: No edema.   Lymphadenopathy:     Cervical: No cervical adenopathy.  Skin:    General: Skin is warm and dry.     Capillary Refill: Capillary refill takes less than 2 seconds.     Findings: No rash.  Neurological:     General: No focal deficit present.     Mental Status: She is alert and oriented to person, place, and time. Mental status is at baseline.  Psychiatric:        Mood and Affect: Mood normal.        Behavior: Behavior normal.        Thought Content: Thought content normal.        Judgment: Judgment normal.      Depression Screen PHQ 2/9 Scores 12/20/2019 11/04/2018 10/11/2017  PHQ - 2 Score 0 0 0  PHQ- 9 Score 3 - 2       Assessment & Plan:     Routine Health Maintenance and Physical Exam  Exercise Activities and Dietary recommendations Goals   None     Immunization History  Administered Date(s) Administered  . Td 07/13/2016  . Tdap 08/16/2008    Health Maintenance  Topic Date Due  . COLONOSCOPY  08/04/2019  . PAP SMEAR-Modifier  10/29/2019  . INFLUENZA VACCINE  03/27/2020 (Originally 07/29/2019)  . MAMMOGRAM  12/04/2021  . TETANUS/TDAP  07/13/2026  . Hepatitis C Screening  Completed  . HIV Screening  Completed     Discussed health benefits of physical activity, and encouraged her to engage in regular exercise appropriate for her age and condition.  1. Annual physical exam Normal physical exam today. Will check labs as below and f/u pending lab results. If labs are stable and WNL she will not need to have these rechecked for one year at her next annual physical exam. She is to call the office in the meantime if she has any acute issue, questions or concerns. - CBC w/Diff/Platelet - Comprehensive Metabolic Panel (CMET) - TSH - Lipid Profile - HgB A1c  2. Colon cancer screening Referral placed. Last one was 2015, repeat every 5 years. Dr. Allen Norris.  - Ambulatory referral to Gastroenterology  3. Malignant HTN with heart disease, w/o CHF, w/o chronic kidney  disease BP stable and improved. Continue Amlodipine 5mg  and losartan 50mg .   4. Renal infarction ([place]) Stable. Secondary to failed stent for renal artery aneurysm on the right. Followed by Vascular surgery, Dr. Lucky Cowboy.  - Comprehensive Metabolic Panel (CMET)  5. Renovascular hypertension Stable. Continue amlodipine 5mg  and losartan 50mg . Will check labs as below and f/u pending results. - Comprehensive Metabolic Panel (CMET) - Lipid Profile  6. Avitaminosis D H/O this. Will check labs as below and f/u pending results. - Vitamin D (25 hydroxy)  7. B12 deficiency H/O this. Will check labs as below and f/u pending results. - B12  --------------------------------------------------------------------    [name], PA-C  [facility]

## 2019-12-20 ENCOUNTER — Ambulatory Visit (INDEPENDENT_AMBULATORY_CARE_PROVIDER_SITE_OTHER): Payer: BC Managed Care – PPO | Admitting: Physician Assistant

## 2019-12-20 ENCOUNTER — Encounter: Payer: Self-pay | Admitting: Physician Assistant

## 2019-12-20 ENCOUNTER — Other Ambulatory Visit: Payer: Self-pay

## 2019-12-20 VITALS — BP 121/81 | HR 67 | Temp 96.9°F | Resp 18 | Ht 63.0 in | Wt 140.0 lb

## 2019-12-20 DIAGNOSIS — E538 Deficiency of other specified B group vitamins: Secondary | ICD-10-CM

## 2019-12-20 DIAGNOSIS — Z1211 Encounter for screening for malignant neoplasm of colon: Secondary | ICD-10-CM

## 2019-12-20 DIAGNOSIS — I119 Hypertensive heart disease without heart failure: Secondary | ICD-10-CM | POA: Diagnosis not present

## 2019-12-20 DIAGNOSIS — N280 Ischemia and infarction of kidney: Secondary | ICD-10-CM | POA: Diagnosis not present

## 2019-12-20 DIAGNOSIS — I150 Renovascular hypertension: Secondary | ICD-10-CM

## 2019-12-20 DIAGNOSIS — E559 Vitamin D deficiency, unspecified: Secondary | ICD-10-CM

## 2019-12-20 DIAGNOSIS — Z0000 Encounter for general adult medical examination without abnormal findings: Secondary | ICD-10-CM

## 2019-12-20 NOTE — Patient Instructions (Signed)

## 2019-12-26 DIAGNOSIS — Z0000 Encounter for general adult medical examination without abnormal findings: Secondary | ICD-10-CM | POA: Diagnosis not present

## 2019-12-26 DIAGNOSIS — E559 Vitamin D deficiency, unspecified: Secondary | ICD-10-CM | POA: Diagnosis not present

## 2019-12-26 DIAGNOSIS — N280 Ischemia and infarction of kidney: Secondary | ICD-10-CM | POA: Diagnosis not present

## 2019-12-26 DIAGNOSIS — I150 Renovascular hypertension: Secondary | ICD-10-CM | POA: Diagnosis not present

## 2019-12-26 DIAGNOSIS — E538 Deficiency of other specified B group vitamins: Secondary | ICD-10-CM | POA: Diagnosis not present

## 2019-12-27 ENCOUNTER — Telehealth: Payer: Self-pay

## 2019-12-27 ENCOUNTER — Encounter: Payer: Self-pay | Admitting: Physician Assistant

## 2019-12-27 LAB — COMPREHENSIVE METABOLIC PANEL
ALT: 20 IU/L (ref 0–32)
AST: 22 IU/L (ref 0–40)
Albumin/Globulin Ratio: 1.5 (ref 1.2–2.2)
Albumin: 4.1 g/dL (ref 3.8–4.9)
Alkaline Phosphatase: 96 IU/L (ref 39–117)
BUN/Creatinine Ratio: 21 (ref 9–23)
BUN: 19 mg/dL (ref 6–24)
Bilirubin Total: 0.3 mg/dL (ref 0.0–1.2)
CO2: 20 mmol/L (ref 20–29)
Calcium: 9.2 mg/dL (ref 8.7–10.2)
Chloride: 105 mmol/L (ref 96–106)
Creatinine, Ser: 0.9 mg/dL (ref 0.57–1.00)
GFR calc Af Amer: 82 mL/min/{1.73_m2} (ref >59)
GFR calc non Af Amer: 71 mL/min/{1.73_m2} (ref >59)
Globulin, Total: 2.7 g/dL (ref 1.5–4.5)
Glucose: 91 mg/dL (ref 65–99)
Potassium: 4.3 mmol/L (ref 3.5–5.2)
Sodium: 140 mmol/L (ref 134–144)
Total Protein: 6.8 g/dL (ref 6.0–8.5)

## 2019-12-27 LAB — CBC WITH DIFFERENTIAL/PLATELET
Basophils Absolute: 0.1 10*3/uL (ref 0.0–0.2)
Basos: 1 %
EOS (ABSOLUTE): 0.1 10*3/uL (ref 0.0–0.4)
Eos: 1 %
Hematocrit: 43 % (ref 34.0–46.6)
Hemoglobin: 13.9 g/dL (ref 11.1–15.9)
Immature Grans (Abs): 0 10*3/uL (ref 0.0–0.1)
Immature Granulocytes: 1 %
Lymphocytes Absolute: 1.2 10*3/uL (ref 0.7–3.1)
Lymphs: 22 %
MCH: 29.7 pg (ref 26.6–33.0)
MCHC: 32.3 g/dL (ref 31.5–35.7)
MCV: 92 fL (ref 79–97)
Monocytes Absolute: 0.5 10*3/uL (ref 0.1–0.9)
Monocytes: 8 %
Neutrophils Absolute: 3.7 10*3/uL (ref 1.4–7.0)
Neutrophils: 67 %
Platelets: 288 10*3/uL (ref 150–450)
RBC: 4.68 x10E6/uL (ref 3.77–5.28)
RDW: 12.1 % (ref 11.7–15.4)
WBC: 5.5 10*3/uL (ref 3.4–10.8)

## 2019-12-27 LAB — HEMOGLOBIN A1C
Est. average glucose Bld gHb Est-mCnc: 105 mg/dL
Hgb A1c MFr Bld: 5.3 % (ref 4.8–5.6)

## 2019-12-27 LAB — TSH: TSH: 1.29 u[IU]/mL (ref 0.450–4.500)

## 2019-12-27 LAB — VITAMIN D 25 HYDROXY (VIT D DEFICIENCY, FRACTURES): Vit D, 25-Hydroxy: 34.3 ng/mL (ref 30.0–100.0)

## 2019-12-27 LAB — LIPID PANEL
Chol/HDL Ratio: 2.6 ratio (ref 0.0–4.4)
Cholesterol, Total: 221 mg/dL — ABNORMAL HIGH (ref 100–199)
HDL: 86 mg/dL (ref >39)
LDL Chol Calc (NIH): 123 mg/dL — ABNORMAL HIGH (ref 0–99)
Triglycerides: 69 mg/dL (ref 0–149)
VLDL Cholesterol Cal: 12 mg/dL (ref 5–40)

## 2019-12-27 LAB — VITAMIN B12: Vitamin B-12: 676 pg/mL (ref 232–1245)

## 2019-12-27 NOTE — Telephone Encounter (Signed)
-----   Message from [name], [place] sent at 12/27/2019  9:09 AM EST ----- Blood count is normal. Kidney and liver function is normal. Sodium, potassium and calcium are normal. Cholesterol is up slightly compared to last year, this could also be slightly increased if this was not fasting. A1c/sugar is normal. Vit D is normal. B12 is normal.

## 2019-12-27 NOTE — Telephone Encounter (Signed)
Tried calling patient. Left message to call back. OK for [facility] triage nurse to advise patient.  

## 2019-12-27 NOTE — Telephone Encounter (Signed)
This encounter was created in error - please disregard.

## 2019-12-27 NOTE — Telephone Encounter (Signed)
Pt. Called back. Given results, verbalizes understanding.

## 2020-01-02 ENCOUNTER — Other Ambulatory Visit: Payer: Self-pay

## 2020-01-02 ENCOUNTER — Telehealth: Payer: Self-pay

## 2020-01-02 DIAGNOSIS — Z1211 Encounter for screening for malignant neoplasm of colon: Secondary | ICD-10-CM

## 2020-01-02 DIAGNOSIS — Z86010 Personal history of colonic polyps: Secondary | ICD-10-CM

## 2020-01-02 NOTE — Telephone Encounter (Signed)
Returned patients call to schedule her colonoscopy for 01/19/20 with Dr. Allen Norris.  This was scheduled at [facility].  Informed her that the date was available.  She said it would be cheaper for her to have it at [facility], therefore she has requested to call me back to reschedule from 01/19/20 at [facility].  She will call back with another date.  I asked her to disregard the previous instructions sent reflecting 01/19/20 [facility].  Thanks [employer]

## 2020-01-09 ENCOUNTER — Other Ambulatory Visit: Payer: Self-pay

## 2020-01-09 ENCOUNTER — Encounter (INDEPENDENT_AMBULATORY_CARE_PROVIDER_SITE_OTHER): Payer: Self-pay | Admitting: Vascular Surgery

## 2020-01-09 ENCOUNTER — Ambulatory Visit (INDEPENDENT_AMBULATORY_CARE_PROVIDER_SITE_OTHER): Payer: BC Managed Care – PPO | Admitting: Vascular Surgery

## 2020-01-09 ENCOUNTER — Ambulatory Visit (INDEPENDENT_AMBULATORY_CARE_PROVIDER_SITE_OTHER): Payer: BC Managed Care – PPO

## 2020-01-09 VITALS — BP 122/82 | HR 62 | Resp 15 | Wt 140.0 lb

## 2020-01-09 DIAGNOSIS — H93A9 Pulsatile tinnitus, unspecified ear: Secondary | ICD-10-CM

## 2020-01-09 DIAGNOSIS — I722 Aneurysm of renal artery: Secondary | ICD-10-CM

## 2020-01-09 DIAGNOSIS — I150 Renovascular hypertension: Secondary | ICD-10-CM | POA: Diagnosis not present

## 2020-01-09 NOTE — Assessment & Plan Note (Signed)
Her duplex today shows no hemodynamically significant left renal artery stenosis or aneurysms in the left renal artery circuit.  The right renal artery is occluded after previous treatment for her aneurysm and this is stable from her previous study.  Her blood pressure is doing much better.  Her renal function has been fine with her solitary kidney.  We will follow this on an annual basis with duplex.

## 2020-01-09 NOTE — Progress Notes (Signed)
MRN : [id number]  Natalie Dickerson is a 59 y.o. (08-22-1961) female who presents with chief complaint of  Chief Complaint  Patient presents with  . Follow-up    ultrasound follow up  .  History of Present Illness: Patient returns today in follow up of her right renal artery aneurysm status post embolization and renovascular hypertension.  She says her blood pressure is actually running too low when she has backed off on her blood pressure medications.  Her blood pressure today is 122/82 and she has not been taking her nighttime antihypertensive.  She saw her primary care physician last month and I have told her I think it is reasonable to back off of her antihypertensives but to let her primary care provider know.  Her duplex today shows no hemodynamically significant left renal artery stenosis or aneurysms in the left renal artery circuit.  The right renal artery is occluded after previous treatment for her aneurysm and this is stable from her previous study.  Current Outpatient Medications  Medication Sig Dispense Refill  . amLODipine (NORVASC) 5 MG tablet TAKE 1 TABLET BY MOUTH  DAILY IN THE EVENING 90 tablet 3  . aspirin EC 81 MG EC tablet Take 1 tablet (81 mg total) by mouth daily. 90 tablet 3  . cholecalciferol (VITAMIN D) 1000 units tablet Take 1,000 Units by mouth daily.    Marland Kitchen losartan (COZAAR) 50 MG tablet TAKE 1 TABLET BY MOUTH  DAILY 90 tablet 3  . Multiple Vitamins-Minerals (MULTIVITAMIN WITH MINERALS) tablet Take 1 tablet by mouth daily.    Marland Kitchen pyridOXINE (VITAMIN B-6) 100 MG tablet Take 100 mg daily by mouth.    . vitamin B-12 (CYANOCOBALAMIN) 1000 MCG tablet Take 1,000 mcg by mouth daily.     No current facility-administered medications for this visit.    Past Medical History:  Diagnosis Date  . Cancer ([place])    skin ca  . Hx of basal cell carcinoma 12/06/2013   Left sup forehead    Past Surgical History:  Procedure Laterality Date  . EMBOLIZATION N/A 03/24/2017   Procedure: Embolization;  Surgeon: Algernon Huxley, MD;  Location: [place] [facility];  Service: Cardiovascular;  Laterality: N/A;  . FINGER SURGERY    . PERIPHERAL VASCULAR CATHETERIZATION N/A 01/18/2017   Procedure: Renal Angiography;  Surgeon: Algernon Huxley, MD;  Location: [place] [facility];  Service: Cardiovascular;  Laterality: N/A;  . PERIPHERAL VASCULAR CATHETERIZATION  01/18/2017   Procedure: Renal Intervention;  Surgeon: Algernon Huxley, MD;  Location: [place] [facility];  Service: Cardiovascular;;  . RENAL ANGIOGRAPHY N/A 03/24/2017   Procedure: Renal Angiography;  Surgeon: Algernon Huxley, MD;  Location: [place] [facility];  Service: Cardiovascular;  Laterality: N/A;   Family History  Problem Relation Age of Onset  . Hypertension Mother   . Breast cancer Mother   . Cancer Mother   . Breast cancer Father   No bleeding disorders or clotting disorders  Social History        Social History   Substance Use Topics   . Smoking status: Former Research scientist (life sciences)   . Smokeless tobacco: Never Used      Comment: Smoked for about 5 years, smoked about 1 pack per day. Quit in 1984   . Alcohol use Yes     Comment: Occasional alcohol use; Drinks 2-4 glasses of wine per week.   No IVDU  No Known Allergies     REVIEW OF SYSTEMS(Negative unless checked)  Constitutional: [] ?Weight loss[] ?Fever[] ?Chills Cardiac:[] ?Chest pain[] ?Chest pressure[] ?Palpitations [] ?Shortness of breath when laying flat [] ?Shortness of breath at rest [] ?Shortness of breath with exertion. Vascular: [] ?Pain in legs with walking[] ?Pain in legsat rest[] ?Pain in legs when laying flat [] ?Claudication [] ?Pain in feet when walking [] ?Pain in feet at rest [] ?Pain in feet when laying flat [] ?History of DVT [] ?Phlebitis [] ?Swelling in legs [] ?Varicose veins [] ?Non-healing ulcers Pulmonary: [] ?Uses home oxygen [] ?Productive cough[] ?Hemoptysis [] ?Wheeze [] ?COPD  [] ?Asthma Neurologic: [] ?Dizziness [] ?Blackouts [] ?Seizures [] ?History of stroke [] ?History of TIA[] ?Aphasia [] ?Temporary blindness[] ?Dysphagia [] ?Weaknessor numbness in arms [] ?Weakness or numbnessin legs Musculoskeletal: [x] ?Arthritis [] ?Joint swelling [] ?Joint pain [x] ?Low back pain Hematologic:[] ?Easy bruising[] ?Easy bleeding [] ?Hypercoagulable state [] ?Anemic [] ?Hepatitis Gastrointestinal:[] ?Blood in stool[] ?Vomiting blood[] ?Gastroesophageal reflux/heartburn[] ?Abdominal pain Genitourinary: [] ?Chronic kidney disease [] ?Difficulturination [] ?Frequenturination [] ?Burning with urination[] ?Hematuria Skin: [] ?Rashes [] ?Ulcers [] ?Wounds Psychological: [] ?History of anxiety[] ?History of major depression.    Physical Examination  BP 122/82 (BP Location: Right Arm)   Pulse 62   Resp 15   Wt 140 lb (63.5 kg)   BMI 24.80 kg/m  Gen:  WD/WN, NAD Head: [place]/AT, No temporalis wasting. Ear/Nose/Throat: Hearing grossly intact, nares w/o erythema or drainage Eyes: Conjunctiva clear. Sclera non-icteric Neck: Supple.  Trachea midline Pulmonary:  Good air movement, no use of accessory muscles.  Cardiac: RRR, no JVD Vascular:  Vessel Right Left  Radial Palpable Palpable                                   Gastrointestinal: soft, non-tender/non-distended. No guarding/reflex.  Musculoskeletal: M/S 5/5 throughout.  No deformity or atrophy.  No edema. Neurologic: Sensation grossly intact in extremities.  Symmetrical.  Speech is fluent.  Psychiatric: Judgment intact, Mood & affect appropriate for pt's clinical situation. Dermatologic: No rashes or ulcers noted.  No cellulitis or open wounds.       Labs Recent Results (from the past 2160 hour(s))  CBC w/Diff/Platelet     Status: None   Collection Time: 12/26/19  8:14 AM  Result Value Ref Range   WBC 5.5 3.4 - 10.8 x10E3/uL   RBC 4.68 3.77 - 5.28 x10E6/uL   Hemoglobin  13.9 11.1 - 15.9 g/dL   Hematocrit 43.0 34.0 - 46.6 %   MCV 92 79 - 97 fL   MCH 29.7 26.6 - 33.0 pg   MCHC 32.3 31.5 - 35.7 g/dL   RDW 12.1 11.7 - 15.4 %   Platelets 288 150 - 450 x10E3/uL   Neutrophils 67 Not Estab. %   Lymphs 22 Not Estab. %   Monocytes 8 Not Estab. %   Eos 1 Not Estab. %   Basos 1 Not Estab. %   Neutrophils Absolute 3.7 1.4 - 7.0 x10E3/uL   Lymphocytes Absolute 1.2 0.7 - 3.1 x10E3/uL   Monocytes Absolute 0.5 0.1 - 0.9 x10E3/uL   EOS (ABSOLUTE) 0.1 0.0 - 0.4 x10E3/uL   Basophils Absolute 0.1 0.0 - 0.2 x10E3/uL   Immature Granulocytes 1 Not Estab. %   Immature Grans (Abs) 0.0 0.0 - 0.1 x10E3/uL  Comprehensive Metabolic Panel (CMET)     Status: None   Collection Time: 12/26/19  8:14 AM  Result Value Ref Range   Glucose 91 65 - 99 mg/dL   BUN 19 6 - 24 mg/dL   Creatinine, Ser 0.90 0.57 - 1.00 mg/dL   GFR calc non Af Amer 71 >59 mL/min/1.73   GFR calc Af Amer 82 >59 mL/min/1.73   BUN/Creatinine Ratio 21 9 - 23   Sodium 140 134 - 144 mmol/L  Potassium 4.3 3.5 - 5.2 mmol/L   Chloride 105 96 - 106 mmol/L   CO2 20 20 - 29 mmol/L   Calcium 9.2 8.7 - 10.2 mg/dL   Total Protein 6.8 6.0 - 8.5 g/dL   Albumin 4.1 3.8 - 4.9 g/dL   Globulin, Total 2.7 1.5 - 4.5 g/dL   Albumin/Globulin Ratio 1.5 1.2 - 2.2   Bilirubin Total 0.3 0.0 - 1.2 mg/dL   Alkaline Phosphatase 96 39 - 117 IU/L   AST 22 0 - 40 IU/L   ALT 20 0 - 32 IU/L  TSH     Status: None   Collection Time: 12/26/19  8:14 AM  Result Value Ref Range   TSH 1.290 0.450 - 4.500 uIU/mL  Lipid Profile     Status: Abnormal   Collection Time: 12/26/19  8:14 AM  Result Value Ref Range   Cholesterol, Total 221 (H) 100 - 199 mg/dL   Triglycerides 69 0 - 149 mg/dL   HDL 86 >39 mg/dL   VLDL Cholesterol Cal 12 5 - 40 mg/dL   LDL Chol Calc (NIH) 123 (H) 0 - 99 mg/dL   Chol/HDL Ratio 2.6 0.0 - 4.4 ratio    Comment:                                   T. Chol/HDL Ratio                                             Men   Women                               1/2 Avg.Risk  3.4    3.3                                   Avg.Risk  5.0    4.4                                2X Avg.Risk  9.6    7.1                                3X Avg.Risk 23.4   11.0   HgB A1c     Status: None   Collection Time: 12/26/19  8:14 AM  Result Value Ref Range   Hgb A1c MFr Bld 5.3 4.8 - 5.6 %    Comment:          Prediabetes: 5.7 - 6.4          Diabetes: >6.4          Glycemic control for adults with diabetes: <7.0    Est. average glucose Bld gHb Est-mCnc 105 mg/dL  Vitamin D (25 hydroxy)     Status: None   Collection Time: 12/26/19  8:14 AM  Result Value Ref Range   Vit D, 25-Hydroxy 34.3 30.0 - 100.0 ng/mL    Comment: Vitamin D deficiency has been defined by the [place] and an Endocrine Society practice guideline as a level of serum 25-OH vitamin D  less than 20 ng/mL (1,2). The Endocrine Society went on to further define vitamin D insufficiency as a level between 21 and 29 ng/mL (2). 1. IOM (Institute of Medicine). 2010. Dietary reference    intakes for calcium and D. [place]: The    [employer]. 2. Holick MF, Binkley [place], Bischoff-Ferrari HA, et al.    Evaluation, treatment, and prevention of vitamin D    deficiency: an Endocrine Society clinical practice    guideline. JCEM. 2011 Jul; 96(7):1911-30.   B12     Status: None   Collection Time: 12/26/19  8:14 AM  Result Value Ref Range   Vitamin B-12 676 232 - 1,245 pg/mL    Radiology No results found.  Assessment/Plan Pulsatile tinnitus Carotid duplex was normal. Recheck as needed  Renovascular hypertension Better after treatment for her renal artery aneurysm.  Has been backing off her medicine and will discuss this with her primary care physician.  Renal artery aneurysm of native kidney [facility]) Her duplex today shows no hemodynamically significant left renal artery stenosis or aneurysms in the left renal artery circuit.  The right  renal artery is occluded after previous treatment for her aneurysm and this is stable from her previous study.  Her blood pressure is doing much better.  Her renal function has been fine with her solitary kidney.  We will follow this on an annual basis with duplex.    Leotis Pain, MD  01/09/2020 9:22 AM    This note was created with Dragon medical transcription system.  Any errors from dictation are purely unintentional

## 2020-01-11 DIAGNOSIS — Z1283 Encounter for screening for malignant neoplasm of skin: Secondary | ICD-10-CM | POA: Diagnosis not present

## 2020-01-11 DIAGNOSIS — L820 Inflamed seborrheic keratosis: Secondary | ICD-10-CM | POA: Diagnosis not present

## 2020-01-11 DIAGNOSIS — L570 Actinic keratosis: Secondary | ICD-10-CM | POA: Diagnosis not present

## 2020-01-11 DIAGNOSIS — L578 Other skin changes due to chronic exposure to nonionizing radiation: Secondary | ICD-10-CM | POA: Diagnosis not present

## 2020-01-11 DIAGNOSIS — D225 Melanocytic nevi of trunk: Secondary | ICD-10-CM | POA: Diagnosis not present

## 2020-01-11 DIAGNOSIS — D2230 Melanocytic nevi of unspecified part of face: Secondary | ICD-10-CM | POA: Diagnosis not present

## 2020-01-17 ENCOUNTER — Other Ambulatory Visit: Admission: RE | Admit: 2020-01-17 | Payer: BC Managed Care – PPO | Source: Ambulatory Visit

## 2020-01-19 ENCOUNTER — Ambulatory Visit
Admission: RE | Admit: 2020-01-19 | Payer: BC Managed Care – PPO | Source: Home / Self Care | Admitting: Gastroenterology

## 2020-01-19 ENCOUNTER — Encounter: Admission: RE | Payer: Self-pay | Source: Home / Self Care

## 2020-01-19 SURGERY — COLONOSCOPY WITH PROPOFOL
Anesthesia: General

## 2020-02-22 DIAGNOSIS — L570 Actinic keratosis: Secondary | ICD-10-CM | POA: Diagnosis not present

## 2020-03-20 ENCOUNTER — Encounter: Payer: Self-pay | Admitting: Dermatology

## 2020-03-20 ENCOUNTER — Ambulatory Visit (INDEPENDENT_AMBULATORY_CARE_PROVIDER_SITE_OTHER): Payer: BC Managed Care – PPO | Admitting: Dermatology

## 2020-03-20 ENCOUNTER — Other Ambulatory Visit: Payer: Self-pay

## 2020-03-20 DIAGNOSIS — L578 Other skin changes due to chronic exposure to nonionizing radiation: Secondary | ICD-10-CM | POA: Diagnosis not present

## 2020-03-20 DIAGNOSIS — L820 Inflamed seborrheic keratosis: Secondary | ICD-10-CM

## 2020-03-20 DIAGNOSIS — L570 Actinic keratosis: Secondary | ICD-10-CM

## 2020-03-20 DIAGNOSIS — L719 Rosacea, unspecified: Secondary | ICD-10-CM | POA: Diagnosis not present

## 2020-03-20 DIAGNOSIS — L821 Other seborrheic keratosis: Secondary | ICD-10-CM

## 2020-03-20 NOTE — Progress Notes (Signed)
   Follow-Up Visit   Subjective  Natalie Dickerson is a 59 y.o. female who presents for the following: AK f/u (S/P PDT on the face) and new skin lesion (patient noticed the lesion a few weeks ago. It is red and inflamed.).  The following portions of the chart were reviewed this encounter and updated as appropriate:     Review of Systems: No other skin or systemic complaints.  Objective  Well appearing patient in no apparent distress; mood and affect are within normal limits.  A focused examination was performed including the face and L axilla. Relevant physical exam findings are noted in the Assessment and Plan.  Objective  Face (8): Erythematous thin papules/macules with gritty scale.   Objective  Face, trunk, extremities: Diffuse scaly erythematous macules with underlying dyspigmentation.   Objective  L forehead x 4, L inf axillary x 1 (5): Erythematous keratotic or waxy stuck-on papule or plaque.   Objective  Cheeks: Mid face erythema with telangiectasias +/- scattered inflammatory papules.   Objective  Face, trunk, extremities: Stuck-on, waxy, tan-brown papule or plaque --Discussed benign etiology and prognosis.   Assessment & Plan  AK (actinic keratosis) (8) Face  Destruction of lesion - Face Complexity: simple   Destruction method: cryotherapy   Informed consent: discussed and consent obtained   Timeout:  patient name, date of birth, surgical site, and procedure verified Lesion destroyed using liquid nitrogen: Yes   Region frozen until ice ball extended beyond lesion: Yes   Outcome: patient tolerated procedure well with no complications   Post-procedure details: wound care instructions given    Actinic skin damage Face, trunk, extremities  Benign, observe.    Inflamed seborrheic keratosis (5) L forehead x 4, L inf axillary x 1    Destruction of lesion - L forehead x 4, L inf axillary x 1 Complexity: simple   Destruction method: cryotherapy   Informed  consent: discussed and consent obtained   Timeout:  patient name, date of birth, surgical site, and procedure verified Lesion destroyed using liquid nitrogen: Yes   Region frozen until ice ball extended beyond lesion: Yes   Outcome: patient tolerated procedure well with no complications   Post-procedure details: wound care instructions given    Rosacea Cheeks  With telangiectasias - Benign, observe. Discuss BBL, advised not covered by insurance pamphlet given.   Seborrheic keratosis Face, trunk, extremities  Benign, observe.    Return in about 3 months (around 06/20/2020) for F/U appt. recheck ISK's and AK's.   Tanya Nones, CMA, am acting as scribe for Sarina Ser, MD .

## 2020-06-17 ENCOUNTER — Ambulatory Visit (INDEPENDENT_AMBULATORY_CARE_PROVIDER_SITE_OTHER): Payer: BC Managed Care – PPO | Admitting: Dermatology

## 2020-06-17 ENCOUNTER — Other Ambulatory Visit: Payer: Self-pay

## 2020-06-17 DIAGNOSIS — L578 Other skin changes due to chronic exposure to nonionizing radiation: Secondary | ICD-10-CM

## 2020-06-17 DIAGNOSIS — L570 Actinic keratosis: Secondary | ICD-10-CM

## 2020-06-17 NOTE — Patient Instructions (Signed)
Cryotherapy Aftercare  . Wash gently with soap and water everyday.   . Apply Vaseline and Band-Aid daily until healed.  

## 2020-06-17 NOTE — Progress Notes (Signed)
   Follow-Up Visit   Subjective  Natalie Dickerson is a 59 y.o. female who presents for the following: Actinic Keratosis (3 months f/u on face precancers treated with Ln2  3 months ago ).  The following portions of the chart were reviewed this encounter and updated as appropriate:  Tobacco  Allergies  Meds  Problems  Med Hx  Surg Hx  Fam Hx      Review of Systems:  No other skin or systemic complaints except as noted in HPI or Assessment and Plan.  Objective  Well appearing patient in no apparent distress; mood and affect are within normal limits.  A focused examination was performed including face, chest, arms . Relevant physical exam findings are noted in the Assessment and Plan.  Objective  face, chest, R forearm (8): Erythematous thin papules/macules with gritty scale.    Assessment & Plan    AK (actinic keratosis) (8) face, chest, R forearm  Destruction of lesion - face, chest, R forearm Complexity: simple   Destruction method: cryotherapy   Informed consent: discussed and consent obtained   Timeout:  patient name, date of birth, surgical site, and procedure verified Lesion destroyed using liquid nitrogen: Yes   Region frozen until ice ball extended beyond lesion: Yes   Outcome: patient tolerated procedure well with no complications   Post-procedure details: wound care instructions given    Actinic Damage - diffuse scaly erythematous macules with underlying dyspigmentation - Recommend daily broad spectrum sunscreen SPF 30+ to sun-exposed areas, reapply every 2 hours as needed.  - Call for new or changing lesions.  Return in about 8 months (around 02/17/2021) for Aks . IMarye Round, CMA, am acting as scribe for Sarina Ser, MD .  Documentation: I have reviewed the above documentation for accuracy and completeness, and I agree with the above.  Sarina Ser, MD

## 2020-06-18 ENCOUNTER — Encounter: Payer: Self-pay | Admitting: Dermatology

## 2020-08-10 ENCOUNTER — Other Ambulatory Visit: Payer: Self-pay | Admitting: Physician Assistant

## 2020-08-10 DIAGNOSIS — I119 Hypertensive heart disease without heart failure: Secondary | ICD-10-CM

## 2020-08-10 DIAGNOSIS — I150 Renovascular hypertension: Secondary | ICD-10-CM

## 2020-08-10 NOTE — Telephone Encounter (Signed)
Requested medications are due for refill today?  Yes  Requested medications are on active medication list?  Yes  Last Refill:   09/22/2019  # 90 with 3 refills  Future visit scheduled? No   Notes to Clinic:  Medication failed RX refill protocol due to no valid encounter in the past 6 months and no labs within the past 180 days.

## 2020-08-10 NOTE — Telephone Encounter (Signed)
Requested Prescriptions  Pending Prescriptions Disp Refills  . losartan (COZAAR) 50 MG tablet [Pharmacy Med Name: LOSARTAN  50MG   TAB] 90 tablet 3    Sig: TAKE 1 TABLET BY MOUTH  DAILY     Cardiovascular:  Angiotensin Receptor Blockers Failed - 08/10/2020 10:15 PM      Failed - Cr in normal range and within 180 days    Creat  Date Value Ref Range Status  10/12/2017 1.01 0.50 - 1.05 mg/dL Final    Comment:    For patients >58 years of age, the reference limit for Creatinine is approximately 13% higher for people identified as African-American. .    Creatinine, Ser  Date Value Ref Range Status  12/26/2019 0.90 0.57 - 1.00 mg/dL Final         Failed - K in normal range and within 180 days    Potassium  Date Value Ref Range Status  12/26/2019 4.3 3.5 - 5.2 mmol/L Final         Failed - Valid encounter within last 6 months    Recent Outpatient Visits          7 months ago Annual physical exam   [facility] [place], [name], [place]   1 year ago Annual physical exam   [facility] [place], [name], [place]   2 years ago Annual physical exam   [place], [name], [place]   2 years ago [place], [place], [place]   3 years ago Irritant contact dermatitis due to other agents   [place], [name], PA-C      Future Appointments            In 6 months [name], MD [place]  - Patient is not pregnant      Passed - Last BP in normal range    BP Readings from Last 1 Encounters:  01/09/20 122/82         . amLODipine (NORVASC) 5 MG tablet [Pharmacy Med Name: AMLODIPINE  5MG   TAB] 30 tablet 0    Sig: TAKE 1 TABLET BY MOUTH  DAILY IN THE EVENING     Cardiovascular:  Calcium Channel Blockers Failed - 08/10/2020 10:15 PM      Failed - Valid encounter within last 6 months    Recent Outpatient Visits          7  months ago Annual physical exam   [facility], [name], [place]   1 year ago Annual physical exam   [place], [name], [place]   2 years ago Annual physical exam   [place], [name], [place]   2 years ago [place], [place], [place]   3 years ago Irritant contact dermatitis due to other agents   [place], [name], [place]      Future Appointments            In 6 months [name], MD [place] BP in normal range    BP Readings from Last 1 Encounters:  01/09/20 122/82         One month 30 day supply courtesy refill with reminder for patient to call office to schedule a 6  month follow-up appointment.

## 2020-08-12 ENCOUNTER — Other Ambulatory Visit: Payer: Self-pay | Admitting: Physician Assistant

## 2020-08-12 DIAGNOSIS — I150 Renovascular hypertension: Secondary | ICD-10-CM

## 2020-08-12 MED ORDER — LOSARTAN POTASSIUM 50 MG PO TABS: 50.0000 mg | ORAL_TABLET | Freq: Every day | ORAL | 0 refills | Status: DC

## 2020-11-11 ENCOUNTER — Other Ambulatory Visit: Payer: Self-pay

## 2020-11-11 ENCOUNTER — Ambulatory Visit (INDEPENDENT_AMBULATORY_CARE_PROVIDER_SITE_OTHER): Payer: BC Managed Care – PPO | Admitting: Family Medicine

## 2020-11-11 ENCOUNTER — Encounter: Payer: Self-pay | Admitting: Family Medicine

## 2020-11-11 VITALS — BP 113/79 | HR 76 | Temp 99.0°F | Resp 16 | Ht 63.0 in | Wt 140.0 lb

## 2020-11-11 DIAGNOSIS — L0291 Cutaneous abscess, unspecified: Secondary | ICD-10-CM | POA: Diagnosis not present

## 2020-11-11 MED ORDER — DOXYCYCLINE HYCLATE 100 MG PO TABS: 100.0000 mg | ORAL_TABLET | Freq: Two times a day (BID) | ORAL | 0 refills | Status: AC

## 2020-11-11 NOTE — Progress Notes (Signed)
Established patient visit   [name]   DOB: 08/15/1961   59 y.o. Female  MRN: [id number] Visit Date: 11/11/2020  Today's healthcare provider: Lavon Paganini, MD   Chief Complaint  Patient presents with  . Foot Pain   Subjective    Foot Injury  The incident occurred 3 to 5 days ago (3 days). The incident occurred at home. The pain is present in the right foot. The quality of the pain is described as aching and shooting. The pain is moderate. The pain has been intermittent since onset. Associated symptoms include an inability to bear weight. Pertinent negatives include no loss of motion, loss of sensation, muscle weakness or numbness. The symptoms are aggravated by movement. She has tried nothing for the symptoms.     Patient reports that she was getting out of her car and hit the top of her right foot on the bottom of the car door. She has swelling and redness in the area where she is injured.   Social History   Tobacco Use  . Smoking status: Former Smoker    Years: 5.00  . Smokeless tobacco: Never Used  . Tobacco comment: Smoked for about 5 years, smoked about 1 pack per day. Quit in 1984  Vaping Use  . Vaping Use: Never used  Substance Use Topics  . Alcohol use: Yes    Comment: Occasional alcohol use; Drinks 2-4 glasses of wine per week.  . Drug use: No       Medications: Outpatient Medications Prior to Visit  Medication Sig  . amLODipine (NORVASC) 5 MG tablet TAKE 1 TABLET BY MOUTH  DAILY IN THE EVENING  . aspirin EC 81 MG EC tablet Take 1 tablet (81 mg total) by mouth daily.  . cholecalciferol (VITAMIN D) 1000 units tablet Take 1,000 Units by mouth daily.  Marland Kitchen losartan (COZAAR) 50 MG tablet Take 1 tablet (50 mg total) by mouth daily.  . Multiple Vitamins-Minerals (MULTIVITAMIN WITH MINERALS) tablet Take 1 tablet by mouth daily.  Marland Kitchen pyridOXINE (VITAMIN B-6) 100 MG tablet Take 100 mg daily by mouth.  . vitamin B-12 (CYANOCOBALAMIN) 1000 MCG tablet  Take 1,000 mcg by mouth daily.   No facility-administered medications prior to visit.    Review of Systems  Constitutional: Negative.   Musculoskeletal: Positive for myalgias.  Skin: Negative for color change, pallor, rash and wound.  Neurological: Negative for numbness.       Objective    BP 113/79   Pulse 76   Temp 99 F (37.2 C)   Resp 16   Ht 5\' 3"  (1.6 m)   Wt 140 lb (63.5 kg)   BMI 24.80 kg/m    Physical Exam Vitals reviewed.  Constitutional:      General: She is not in acute distress.    Appearance: Normal appearance. She is well-developed.  HENT:     Head: Normocephalic and atraumatic.  Eyes:     General: No scleral icterus.    Conjunctiva/sclera: Conjunctivae normal.  Cardiovascular:     Rate and Rhythm: Normal rate and regular rhythm.  Pulmonary:     Effort: Pulmonary effort is normal. No respiratory distress.  Musculoskeletal:     Comments: No bony tenderness. Tenderness and fluctuance on dorsal R mid-foot  Skin:    General: Skin is warm and dry.     Findings: Erythema present. No rash.  Neurological:     Mental Status: She is alert and oriented to person, place, and  time.  Psychiatric:        Behavior: Behavior normal.         No results found for any visits on 11/11/20.  Assessment & Plan     1. Abscess - no bony tenderness of foot/ankle - no need for imaging at this time -Erythema, warmth, tenderness, fluctuance suggestive of abscess on dorsal right foot -Treat with doxycycline twice daily x7 days -Advised on elevation and warm compresses -May use NSAIDs as needed for discomfort or swelling -Discussed return precautions -If not improving in 3 days, she will call and be reassessed  Meds ordered this encounter  Medications  . doxycycline (VIBRA-TABS) 100 MG tablet    Sig: Take 1 tablet (100 mg total) by mouth 2 (two) times daily for 7 days.    Dispense:  14 tablet    Refill:  0     Return if symptoms worsen or fail to improve.       I, Lavon Paganini, MD, have reviewed all documentation for this visit. The documentation on 11/11/20 for the exam, diagnosis, procedures, and orders are all accurate and complete.   Dinisha Cai, [name], MD, MPH [facility]

## 2020-11-11 NOTE — Patient Instructions (Signed)
Skin Abscess  A skin abscess is an infected area on or under your skin that contains a collection of pus and other material. An abscess may also be called a furuncle, carbuncle, or boil. An abscess can occur in or on almost any part of your body. Some abscesses break open (rupture) on their own. Most continue to get worse unless they are treated. The infection can spread deeper into the body and eventually into your blood, which can make you feel ill. Treatment usually involves draining the abscess. What are the causes? An abscess occurs when germs, like bacteria, pass through your skin and cause an infection. This may be caused by:  A scrape or cut on your skin.  A puncture wound through your skin, including a needle injection or insect bite.  Blocked oil or sweat glands.  Blocked and infected hair follicles.  A cyst that forms beneath your skin (sebaceous cyst) and becomes infected. What increases the risk? This condition is more likely to develop in people who:  Have a weak body defense system (immune system).  Have diabetes.  Have dry and irritated skin.  Get frequent injections or use illegal IV drugs.  Have a foreign body in a wound, such as a splinter.  Have problems with their lymph system or veins. What are the signs or symptoms? Symptoms of this condition include:  A painful, firm bump under the skin.  A bump with pus at the top. This may break through the skin and drain. Other symptoms include:  Redness surrounding the abscess site.  Warmth.  Swelling of the lymph nodes (glands) near the abscess.  Tenderness.  A sore on the skin. How is this diagnosed? This condition may be diagnosed based on:  A physical exam.  Your medical history.  A sample of pus. This may be used to find out what is causing the infection.  Blood tests.  Imaging tests, such as an ultrasound, CT scan, or MRI. How is this treated? A small abscess that drains on its own may  not need treatment. Treatment for larger abscesses may include:  Moist heat or heat pack applied to the area several times a day.  A procedure to drain the abscess (incision and drainage).  Antibiotic medicines. For a severe abscess, you may first get antibiotics through an IV and then change to antibiotics by mouth. Follow these instructions at home: Medicines   Take over-the-counter and prescription medicines only as told by your health care provider.  If you were prescribed an antibiotic medicine, take it as told by your health care provider. Do not stop taking the antibiotic even if you start to feel better. Abscess care   If you have an abscess that has not drained, apply heat to the affected area. Use the heat source that your health care provider recommends, such as a moist heat pack or a heating pad. ? Place a towel between your skin and the heat source. ? Leave the heat on for 20-30 minutes. ? Remove the heat if your skin turns bright red. This is especially important if you are unable to feel pain, heat, or cold. You may have a greater risk of getting burned.  Follow instructions from your health care provider about how to take care of your abscess. Make sure you: ? Cover the abscess with a bandage (dressing). ? Change your dressing or gauze as told by your health care provider. ? Wash your hands with soap and water before you change the   dressing or gauze. If soap and water are not available, use hand sanitizer.  Check your abscess every day for signs of a worsening infection. Check for: ? More redness, swelling, or pain. ? More fluid or blood. ? Warmth. ? More pus or a bad smell. General instructions  To avoid spreading the infection: ? Do not share personal care items, towels, or hot tubs with others. ? Avoid making skin contact with other people.  Keep all follow-up visits as told by your health care provider. This is important. Contact a health care provider if  you have:  More redness, swelling, or pain around your abscess.  More fluid or blood coming from your abscess.  Warm skin around your abscess.  More pus or a bad smell coming from your abscess.  A fever.  Muscle aches.  Chills or a general ill feeling. Get help right away if you:  Have severe pain.  See red streaks on your skin spreading away from the abscess. Summary  A skin abscess is an infected area on or under your skin that contains a collection of pus and other material.  A small abscess that drains on its own may not need treatment.  Treatment for larger abscesses may include having a procedure to drain the abscess and taking an antibiotic. This information is not intended to replace advice given to you by your health care provider. Make sure you discuss any questions you have with your health care provider. Document Revised: 04/06/2019 Document Reviewed: 01/27/2018 Elsevier Patient Education  2020 Elsevier Inc.  

## 2020-11-15 ENCOUNTER — Ambulatory Visit (INDEPENDENT_AMBULATORY_CARE_PROVIDER_SITE_OTHER): Payer: BC Managed Care – PPO | Admitting: Physician Assistant

## 2020-11-15 ENCOUNTER — Other Ambulatory Visit: Payer: Self-pay

## 2020-11-15 ENCOUNTER — Encounter: Payer: Self-pay | Admitting: Physician Assistant

## 2020-11-15 VITALS — BP 143/83 | HR 72 | Temp 98.0°F | Wt 139.0 lb

## 2020-11-15 DIAGNOSIS — M67471 Ganglion, right ankle and foot: Secondary | ICD-10-CM

## 2020-11-15 DIAGNOSIS — I150 Renovascular hypertension: Secondary | ICD-10-CM

## 2020-11-15 MED ORDER — AMLODIPINE BESYLATE 5 MG PO TABS: 5.0000 mg | ORAL_TABLET | Freq: Every evening | ORAL | 3 refills | Status: DC

## 2020-11-15 MED ORDER — LOSARTAN POTASSIUM 50 MG PO TABS: 50.0000 mg | ORAL_TABLET | Freq: Every day | ORAL | 3 refills | Status: DC

## 2020-11-15 NOTE — Progress Notes (Signed)
Established patient visit   [name]   DOB: May 16, 1961   59 y.o. Female  MRN: [id number] Visit Date: 11/15/2020  Today's healthcare provider: [name], PA-C   Chief Complaint  Patient presents with  . Foot Pain   Subjective    HPI  Follow up for abscess  The patient was last seen for this 4 days ago. Changes made at last visit include RX for Doxycycline.  She reports excellent compliance with treatment. She feels that condition is Unchanged. She is not having side effects.   Hit on a chair while moving furniture around. She had immediate pain after hitting the top of her foot but kept going and getting ready as they were getting ready to go to [facility] to visit her mother. She reports once she got to [facility] she noticed her foot was aching and tingling.   She was then evaluated in office by another provider and was felt it may be an abscess. She was started on Doxycycline. She reports there have been no changes in the "knot" on the foot. The bruising has moved down towards her toes as expected.  -----------------------------------------------------------------------------------------    Patient Active Problem List   Diagnosis Date Noted  . Renovascular hypertension 11/02/2017  . Pulsatile tinnitus 04/27/2017  . Acute renal artery occlusion (HCC)   . Renal infarction ([place])   . History of malignant hypertension 03/23/2017  . Renal artery aneurysm of native kidney ([place]) 12/29/2016  . Pulmonary granulomatosis ([place]) 12/29/2016  . Avitaminosis D 10/25/2015  . B12 deficiency 10/25/2015  . History of chicken pox 10/22/2015  . Uterine fibroid 10/22/2015  . Basal cell carcinoma of face 09/27/2013  . Cervical pain 07/19/2008  . Irregular bleeding 07/19/2008   Past Medical History:  Diagnosis Date  . Cancer ([place])    skin ca  . Hx of basal cell carcinoma 12/06/2013   Left sup forehead   Social History   Tobacco Use  . Smoking status: Former Smoker     Years: 5.00  . Smokeless tobacco: Never Used  . Tobacco comment: Smoked for about 5 years, smoked about 1 pack per day. Quit in 1984  Vaping Use  . Vaping Use: Never used  Substance Use Topics  . Alcohol use: Yes    Comment: Occasional alcohol use; Drinks 2-4 glasses of wine per week.  . Drug use: No   Allergies  Allergen Reactions  . Bee Venom Anaphylaxis  . Ivp Dye [Iodinated Diagnostic Agents] Rash       Medications: Outpatient Medications Prior to Visit  Medication Sig  . amLODipine (NORVASC) 5 MG tablet TAKE 1 TABLET BY MOUTH  DAILY IN THE EVENING  . aspirin EC 81 MG EC tablet Take 1 tablet (81 mg total) by mouth daily.  . cholecalciferol (VITAMIN D) 1000 units tablet Take 1,000 Units by mouth daily.  Marland Kitchen doxycycline (VIBRA-TABS) 100 MG tablet Take 1 tablet (100 mg total) by mouth 2 (two) times daily for 7 days.  Marland Kitchen losartan (COZAAR) 50 MG tablet Take 1 tablet (50 mg total) by mouth daily.  . Multiple Vitamins-Minerals (MULTIVITAMIN WITH MINERALS) tablet Take 1 tablet by mouth daily.  Marland Kitchen pyridOXINE (VITAMIN B-6) 100 MG tablet Take 100 mg daily by mouth.  . vitamin B-12 (CYANOCOBALAMIN) 1000 MCG tablet Take 1,000 mcg by mouth daily.   No facility-administered medications prior to visit.    Review of Systems  Constitutional: Negative.   Respiratory: Negative.   Cardiovascular: Negative.  Musculoskeletal: Positive for arthralgias.  Neurological: Negative.       Objective    BP (!) 143/83 (BP Location: Left Arm, Patient Position: Sitting, Cuff Size: Large)   Pulse 72   Temp 98 F (36.7 C) (Oral)   Wt 139 lb (63 kg)   BMI 24.62 kg/m    Physical Exam Vitals reviewed.  Constitutional:      General: She is not in acute distress.    Appearance: Normal appearance. She is well-developed and normal weight. She is not ill-appearing.  HENT:     Head: Normocephalic and atraumatic.  Cardiovascular:     Pulses: Normal pulses.  Pulmonary:     Effort: Pulmonary effort is  normal. No respiratory distress.  Musculoskeletal:     Cervical back: Normal range of motion and neck supple.     Right lower leg: No edema.     Left lower leg: No edema.       Feet:  Neurological:     Mental Status: She is alert.  Psychiatric:        Behavior: Behavior normal.        Thought Content: Thought content normal.        Judgment: Judgment normal.     No results found for any visits on 11/15/20.  Assessment & Plan     1. Ganglion cyst of right foot Suspect ganglion cyst of the midfoot probably exacerbated by the injury. Patient is worried of possible underlying fracture. There is some mild tenderness over the lateral cuneiform to cuboid bone. Intermediate and lateral cuneiform bones are prominent bilaterally. Discussed and decided that she will go to [facility] in the morning for imaging since there is no other imaging available. It is possible they may try to aspirate to see if cystic. Advised patient of this. Continue Doxycycline for now.   2. Renovascular hypertension Stable. Diagnosis pulled for medication refill. Continue current medical treatment plan. - losartan (COZAAR) 50 MG tablet; Take 1 tablet (50 mg total) by mouth daily.  Dispense: 90 tablet; Refill: 3 - amLODipine (NORVASC) 5 MG tablet; Take 1 tablet (5 mg total) by mouth every evening.  Dispense: 90 tablet; Refill: 3  No follow-ups on file.      Reynolds Bowl, PA-C, have reviewed all documentation for this visit. The documentation on 11/15/20 for the exam, diagnosis, procedures, and orders are all accurate and complete.   Rubye Beach  [facility] [phone] (phone) [phone] (fax)  [place]

## 2020-11-15 NOTE — Patient Instructions (Signed)
Ganglion Cyst  A ganglion cyst is a non-cancerous, fluid-filled lump that occurs near a joint or tendon. The cyst grows out of a joint or the lining of a tendon. Ganglion cysts most often develop in the hand or wrist, but they can also develop in the shoulder, elbow, hip, knee, ankle, or foot. Ganglion cysts are ball-shaped or egg-shaped. Their size can range from the size of a pea to larger than a grape. Increased activity may cause the cyst to get bigger because more fluid starts to build up. What are the causes? The exact cause of this condition is not known, but it may be related to:  Inflammation or irritation around the joint.  An injury.  Repetitive movements or overuse.  Arthritis. What increases the risk? You are more likely to develop this condition if:  You are a woman.  You are 40-59 years old. What are the signs or symptoms? The main symptom of this condition is a lump. It most often appears on the hand or wrist. In many cases, there are no other symptoms, but a cyst can sometimes cause:  Tingling.  Pain.  Numbness.  Muscle weakness.  Weak grip.  Less range of motion in a joint. How is this diagnosed? Ganglion cysts are usually diagnosed based on a physical exam. Your health care provider will feel the lump and may shine a light next to it. If it is a ganglion cyst, the light will likely shine through it. Your health care provider may order an X-ray, ultrasound, or MRI to rule out other conditions. How is this treated? Ganglion cysts often go away on their own without treatment. If you have pain or other symptoms, treatment may be needed. Treatment is also needed if the ganglion cyst limits your movement or if it gets infected. Treatment may include:  Wearing a brace or splint on your wrist or finger.  Taking anti-inflammatory medicine.  Having fluid drained from the lump with a needle (aspiration).  Getting a steroid injected into the joint.  Having  surgery to remove the ganglion cyst.  Placing a pad on your shoe or wearing shoes that will not rub against the cyst if it is on your foot. Follow these instructions at home:  Do not press on the ganglion cyst, poke it with a needle, or hit it.  Take over-the-counter and prescription medicines only as told by your health care provider.  If you have a brace or splint: ? Wear it as told by your health care provider. ? Remove it as told by your health care provider. Ask if you need to remove it when you take a shower or a bath.  Watch your ganglion cyst for any changes.  Keep all follow-up visits as told by your health care provider. This is important. Contact a health care provider if:  Your ganglion cyst becomes larger or more painful.  You have pus coming from the lump.  You have weakness or numbness in the affected area.  You have a fever or chills. Get help right away if:  You have a fever and have any of these in the cyst area: ? Increased redness. ? Red streaks. ? Swelling. Summary  A ganglion cyst is a non-cancerous, fluid-filled lump that occurs near a joint or tendon.  Ganglion cysts most often develop in the hand or wrist, but they can also develop in the shoulder, elbow, hip, knee, ankle, or foot.  Ganglion cysts often go away on their own without treatment.  This information is not intended to replace advice given to you by your health care provider. Make sure you discuss any questions you have with your health care provider. Document Revised: 11/26/2017 Document Reviewed: 08/13/2017 Elsevier Patient Education  [place].

## 2020-11-16 ENCOUNTER — Encounter: Payer: Self-pay | Admitting: Physician Assistant

## 2020-11-16 DIAGNOSIS — M71371 Other bursal cyst, right ankle and foot: Secondary | ICD-10-CM | POA: Diagnosis not present

## 2020-11-16 DIAGNOSIS — M67479 Ganglion, unspecified ankle and foot: Secondary | ICD-10-CM

## 2020-11-18 ENCOUNTER — Telehealth: Payer: Self-pay | Admitting: *Deleted

## 2020-11-18 NOTE — Telephone Encounter (Signed)
Copied from [place] [id number]. Topic: Referral - Request for Referral >> Nov 18, 2020  8:54 AM Scherrie Gerlach wrote: Pt wants to know if Anderson Malta will refer her to a podiatrist?  Pt states she saw Sonia Baller Friday, got xrays of her foot, and recommended she see podiatrist. Pt wants to if OK to see Dr Cleda Mccreedy. She heard he was good. Please advise

## 2020-11-18 NOTE — Telephone Encounter (Signed)
Already ordered per [employer]

## 2020-11-18 NOTE — Telephone Encounter (Signed)
Pt calling again stating that she spoke with Provider and was not able to be seen until 01/03/20. Pt is requesting to speak with PCP directly. Please advise.

## 2020-11-19 ENCOUNTER — Ambulatory Visit (INDEPENDENT_AMBULATORY_CARE_PROVIDER_SITE_OTHER): Payer: BC Managed Care – PPO | Admitting: Podiatry

## 2020-11-19 ENCOUNTER — Ambulatory Visit (INDEPENDENT_AMBULATORY_CARE_PROVIDER_SITE_OTHER): Payer: BC Managed Care – PPO

## 2020-11-19 ENCOUNTER — Other Ambulatory Visit: Payer: Self-pay

## 2020-11-19 DIAGNOSIS — M67471 Ganglion, right ankle and foot: Secondary | ICD-10-CM | POA: Diagnosis not present

## 2020-11-19 DIAGNOSIS — S9031XA Contusion of right foot, initial encounter: Secondary | ICD-10-CM

## 2020-11-19 NOTE — Telephone Encounter (Signed)
Please review. Thanks!  

## 2020-11-19 NOTE — Telephone Encounter (Signed)
See mychart messages. Duplicate information.

## 2020-11-19 NOTE — Progress Notes (Signed)
   HPI: 59 y.o. female presenting today as a new patient for evaluation of a large lump that developed to the dorsal aspect of the right foot secondary to an injury.  Patient states that she hit the top of her foot with a chair while at home.  DOI: 11/08/2020.  She had x-rays performed at [facility] and presents with printed copies of the x-rays today.  She states that she was not diagnosed with a fracture.  She presents for further treatment evaluation  Past Medical History:  Diagnosis Date  . Cancer ([place])    skin ca  . Hx of basal cell carcinoma 12/06/2013   Left sup forehead     Physical Exam: General: The patient is alert and oriented x3 in no acute distress.  Dermatology: Skin is warm, dry and supple bilateral lower extremities. Negative for open lesions or macerations.  Vascular: Palpable pedal pulses bilaterally. No edema or erythema noted. Capillary refill within normal limits.  Neurological: Epicritic and protective threshold grossly intact bilaterally.   Musculoskeletal Exam: Range of motion within normal limits to all pedal and ankle joints bilateral. Muscle strength 5/5 in all groups bilateral.  Large palpable fluctuant mass noted to the dorsal aspect of the right foot findings consistent with a ganglion cyst secondary to injury.  There is some tenderness associated to palpation as well.  Assessment: 1.  Contusion right dorsum foot secondary to injury 2.  Ganglion cyst right dorsal foot   Plan of Care:  1. Patient evaluated.  2.  Decision was made today to drain the ganglion cyst.  The foot was prepped in aseptic manner and 2 mL of lidocaine plain was infiltrated to numb the area.  After appropriate numbing an 18-gauge needle was utilized to aspirate the ganglion cyst.  Light dressing was applied 3.  Compression ankle sleeves dispensed.  Wear daily x4 weeks 4.  Continue wearing good supportive shoes 5.  Return to clinic in 3-4 weeks for follow-up  *Works at [employer] in  [place].  Shipping and receiving.      Edrick Kins, DPM [facility]  Dr. Edrick Kins, DPM    [address], [place] [address]                Office [phone]  Fax [phone]

## 2020-12-04 ENCOUNTER — Other Ambulatory Visit: Payer: Self-pay | Admitting: Physician Assistant

## 2020-12-10 ENCOUNTER — Encounter: Payer: Self-pay | Admitting: Podiatry

## 2020-12-10 ENCOUNTER — Other Ambulatory Visit: Payer: Self-pay

## 2020-12-10 ENCOUNTER — Ambulatory Visit (INDEPENDENT_AMBULATORY_CARE_PROVIDER_SITE_OTHER): Payer: BC Managed Care – PPO | Admitting: Podiatry

## 2020-12-10 DIAGNOSIS — S9031XA Contusion of right foot, initial encounter: Secondary | ICD-10-CM | POA: Diagnosis not present

## 2020-12-10 DIAGNOSIS — M67471 Ganglion, right ankle and foot: Secondary | ICD-10-CM | POA: Diagnosis not present

## 2020-12-10 NOTE — Progress Notes (Signed)
   HPI: 59 y.o. female presenting today for follow-up evaluation of drainage of a ganglion cyst.  Patient was last seen in the office on 11/19/2020 at which time we drained a ganglion cyst to the dorsal aspect of the right foot.  Patient states that since that time she is feeling much better.  She has noticed significant improvement.  She does have some minimal tenderness to the area but otherwise significant improvement with no new complaints at this time  Past Medical History:  Diagnosis Date  . Cancer ([place])    skin ca  . Hx of basal cell carcinoma 12/06/2013   Left sup forehead     Physical Exam: General: The patient is alert and oriented x3 in no acute distress.  Dermatology: Skin is warm, dry and supple bilateral lower extremities. Negative for open lesions or macerations.  Vascular: Palpable pedal pulses bilaterally. No edema or erythema noted. Capillary refill within normal limits.  Neurological: Epicritic and protective threshold grossly intact bilaterally.   Musculoskeletal Exam: Range of motion within normal limits to all pedal and ankle joints bilateral. Muscle strength 5/5 in all groups bilateral.  There are some very mild edema and some tenderness around the lateral midfoot.  Assessment: 1.  Status post drainage of ganglion cyst dorsum of right foot   Plan of Care:  1. Patient evaluated.  2.  Continue compression ankle sleeve as needed 3.  Recommend good supportive shoes 4.  Patient may resume full activity no restrictions 5.  Return to clinic as needed      Edrick Kins, DPM [facility]  Dr. Edrick Kins, DPM    [address], [place] [address]                Office [phone]  Fax [phone]

## 2020-12-26 ENCOUNTER — Other Ambulatory Visit: Payer: Self-pay

## 2020-12-26 ENCOUNTER — Encounter: Payer: Self-pay | Admitting: Physician Assistant

## 2020-12-26 ENCOUNTER — Ambulatory Visit (INDEPENDENT_AMBULATORY_CARE_PROVIDER_SITE_OTHER): Payer: BC Managed Care – PPO | Admitting: Physician Assistant

## 2020-12-26 VITALS — BP 118/67 | HR 70 | Temp 98.5°F | Resp 16 | Wt 140.9 lb

## 2020-12-26 DIAGNOSIS — E559 Vitamin D deficiency, unspecified: Secondary | ICD-10-CM

## 2020-12-26 DIAGNOSIS — I150 Renovascular hypertension: Secondary | ICD-10-CM

## 2020-12-26 DIAGNOSIS — H938X2 Other specified disorders of left ear: Secondary | ICD-10-CM

## 2020-12-26 DIAGNOSIS — M79641 Pain in right hand: Secondary | ICD-10-CM

## 2020-12-26 DIAGNOSIS — Z0000 Encounter for general adult medical examination without abnormal findings: Secondary | ICD-10-CM

## 2020-12-26 DIAGNOSIS — J8410 Pulmonary fibrosis, unspecified: Secondary | ICD-10-CM

## 2020-12-26 DIAGNOSIS — R0981 Nasal congestion: Secondary | ICD-10-CM

## 2020-12-26 DIAGNOSIS — N280 Ischemia and infarction of kidney: Secondary | ICD-10-CM | POA: Diagnosis not present

## 2020-12-26 DIAGNOSIS — E538 Deficiency of other specified B group vitamins: Secondary | ICD-10-CM | POA: Diagnosis not present

## 2020-12-26 DIAGNOSIS — Z1239 Encounter for other screening for malignant neoplasm of breast: Secondary | ICD-10-CM | POA: Diagnosis not present

## 2020-12-26 NOTE — Progress Notes (Signed)
Complete physical exam   [name]   DOB: July 01, 1961   58 y.o. Female  MRN: [id number] Visit Date: 12/26/2020  Today's healthcare provider: [name], PA-C   Chief Complaint  Patient presents with  . Annual Exam   Subjective    Natalie Dickerson is a 59 y.o. female who presents today for a complete physical exam.  She reports consuming a general diet. The patient does not participate in regular exercise at present. She generally feels well. She reports sleeping well. She does have additional problems to discuss today.  HPI  Right hand bothering her mostly at night. Had left carpal tunnel repaired by Dr. Peggye Ley. Is having pain and numbness through the hand. Also having stiffness in her fingers, mostly the 2nd and 5th fingers. Does have h/o fracture to the 5th finger, so chronic deformity present (unable to straighten fully).  Nasal congestion moving side to side at night when she lies down. Has been going on for over a month.  Also having weird sensation in left ear. This is side that was affected by head injury a few years ago. States when she puts that ear flat on the pillow it almost feels like there may be a "hole in it, or something."  Does notice back and neck stiffness as well. Reports she knows she feels better when she exercises. Going to start back soon.  Past Medical History:  Diagnosis Date  . Cancer ([place])    skin ca  . Hx of basal cell carcinoma 12/06/2013   Left sup forehead   Past Surgical History:  Procedure Laterality Date  . EMBOLIZATION N/A 03/24/2017   Procedure: Embolization;  Surgeon: Algernon Huxley, MD;  Location: [facility];  Service: Cardiovascular;  Laterality: N/A;  . FINGER SURGERY    . PERIPHERAL VASCULAR CATHETERIZATION N/A 01/18/2017   Procedure: Renal Angiography;  Surgeon: Algernon Huxley, MD;  Location: [facility];  Service: Cardiovascular;  Laterality: N/A;  . PERIPHERAL VASCULAR CATHETERIZATION  01/18/2017    Procedure: Renal Intervention;  Surgeon: Algernon Huxley, MD;  Location: [facility];  Service: Cardiovascular;;  . RENAL ANGIOGRAPHY N/A 03/24/2017   Procedure: Renal Angiography;  Surgeon: Algernon Huxley, MD;  Location: [facility];  Service: Cardiovascular;  Laterality: N/A;   Social History   Socioeconomic History  . Marital status: Married    Spouse name: Not on file  . Number of children: 2  . Years of education: Not on file  . Highest education level: Not on file  Occupational History  . Not on file  Tobacco Use  . Smoking status: Former Smoker    Years: 5.00  . Smokeless tobacco: Never Used  . Tobacco comment: Smoked for about 5 years, smoked about 1 pack per day. Quit in 1984  Vaping Use  . Vaping Use: Never used  Substance and Sexual Activity  . Alcohol use: Yes    Comment: Occasional alcohol use; Drinks 2-4 glasses of wine per week.  . Drug use: No  . Sexual activity: Not on file  Other Topics Concern  . Not on file  Social History Narrative  . Not on file   Social Determinants of Health   Financial Resource Strain: Not on file  Food Insecurity: Not on file  Transportation Needs: Not on file  Physical Activity: Not on file  Stress: Not on file  Social Connections: Not on file  Intimate Partner  Violence: Not on file   Family Status  Relation Name Status  . Mother  Alive  . Father  Deceased at age 60       died from breast cancer   . MGM  Deceased  . MGF  Deceased  . PGM  Deceased  . PGF  Deceased  . Sister  Alive  . Brother  Alive  . Brother  Alive  . Son  Alive  . Son  Alive   Family History  Problem Relation Age of Onset  . Hypertension Mother   . Breast cancer Mother 72  . Cancer Mother        breast cancer  . Breast cancer Father 88  . Hypertension Brother    Allergies  Allergen Reactions  . Bee Venom Anaphylaxis  . Ivp Dye [Iodinated Diagnostic Agents] Rash    Patient Care Team: Reine Just as PCP - General  (Family Medicine)   Medications: Outpatient Medications Prior to Visit  Medication Sig  . amLODipine (NORVASC) 5 MG tablet Take 1 tablet (5 mg total) by mouth every evening.  Marland Kitchen aspirin EC 81 MG EC tablet Take 1 tablet (81 mg total) by mouth daily.  . cholecalciferol (VITAMIN D) 1000 units tablet Take 1,000 Units by mouth daily.  Marland Kitchen losartan (COZAAR) 50 MG tablet Take 1 tablet (50 mg total) by mouth daily.  . Multiple Vitamins-Minerals (MULTIVITAMIN WITH MINERALS) tablet Take 1 tablet by mouth daily.  Marland Kitchen pyridOXINE (VITAMIN B-6) 100 MG tablet Take 100 mg daily by mouth.  . vitamin B-12 (CYANOCOBALAMIN) 1000 MCG tablet Take 1,000 mcg by mouth daily.   No facility-administered medications prior to visit.    Review of Systems  Constitutional: Negative.   HENT: Positive for congestion, ear pain, sinus pressure and tinnitus.   Eyes: Negative.   Respiratory: Negative.   Cardiovascular: Negative.   Gastrointestinal: Negative.   Endocrine: Negative.   Genitourinary: Negative.   Musculoskeletal: Positive for arthralgias, back pain and neck pain.  Skin: Negative.   Allergic/Immunologic: Negative.   Neurological: Positive for numbness.  Hematological: Negative.   Psychiatric/Behavioral: Negative.     Last CBC Lab Results  Component Value Date   WBC 6.0 12/26/2020   HGB 14.4 12/26/2020   HCT 42.8 12/26/2020   MCV 90 12/26/2020   MCH 30.4 12/26/2020   RDW 11.7 12/26/2020   PLT 342 12/26/2020   Last metabolic panel Lab Results  Component Value Date   GLUCOSE 86 12/26/2020   NA 143 12/26/2020   K 5.0 12/26/2020   CL 105 12/26/2020   CO2 22 12/26/2020   BUN 16 12/26/2020   CREATININE 0.88 12/26/2020   GFRNONAA 73 12/26/2020   GFRAA 84 12/26/2020   CALCIUM 9.7 12/26/2020   PROT 7.5 12/26/2020   ALBUMIN 4.7 12/26/2020   LABGLOB 2.8 12/26/2020   AGRATIO 1.7 12/26/2020   BILITOT 0.4 12/26/2020   ALKPHOS 91 12/26/2020   AST 25 12/26/2020   ALT 22 12/26/2020   ANIONGAP 7  03/24/2017      Objective    BP 118/67 (BP Location: Right Arm, Patient Position: Sitting, Cuff Size: Large)   Pulse 70   Temp 98.5 F (36.9 C) (Oral)   Resp 16   Wt 140 lb 14.4 oz (63.9 kg)   BMI 24.96 kg/m  BP Readings from Last 3 Encounters:  12/26/20 118/67  11/15/20 (!) 143/83  11/11/20 113/79   Wt Readings from Last 3 Encounters:  12/26/20 140 lb 14.4 oz (63.9  kg)  11/15/20 139 lb (63 kg)  11/11/20 140 lb (63.5 kg)      Physical Exam Vitals reviewed.  Constitutional:      General: She is not in acute distress.    Appearance: Normal appearance. She is well-developed, normal weight and well-nourished. She is not ill-appearing or diaphoretic.  HENT:     Head: Normocephalic and atraumatic.     Right Ear: Tympanic membrane, ear canal and external ear normal.     Left Ear: Tympanic membrane, ear canal and external ear normal.     Nose: Nose normal.     Mouth/Throat:     Mouth: Oropharynx is clear and moist. Mucous membranes are moist.     Pharynx: Oropharynx is clear. No oropharyngeal exudate.  Eyes:     General: No scleral icterus.       Right eye: No discharge.        Left eye: No discharge.     Extraocular Movements: Extraocular movements intact and EOM normal.     Conjunctiva/sclera: Conjunctivae normal.     Pupils: Pupils are equal, round, and reactive to light.  Neck:     Thyroid: No thyromegaly.     Vascular: No carotid bruit or JVD.     Trachea: No tracheal deviation.  Cardiovascular:     Rate and Rhythm: Normal rate and regular rhythm.     Pulses: Normal pulses and intact distal pulses.     Heart sounds: Normal heart sounds. No murmur heard. No friction rub. No gallop.   Pulmonary:     Effort: Pulmonary effort is normal. No respiratory distress.     Breath sounds: Normal breath sounds. No wheezing or rales.  Chest:     Chest wall: No tenderness.  Abdominal:     General: Abdomen is flat. Bowel sounds are normal. There is no distension.      Palpations: Abdomen is soft. There is no mass.     Tenderness: There is no abdominal tenderness. There is no guarding or rebound.  Musculoskeletal:        General: No tenderness or edema. Normal range of motion.     Cervical back: Normal range of motion and neck supple. No tenderness.     Right lower leg: No edema.     Left lower leg: No edema.  Lymphadenopathy:     Cervical: No cervical adenopathy.  Skin:    General: Skin is warm and dry.     Capillary Refill: Capillary refill takes less than 2 seconds.     Findings: No rash.  Neurological:     General: No focal deficit present.     Mental Status: She is alert and oriented to person, place, and time. Mental status is at baseline.  Psychiatric:        Mood and Affect: Mood and affect and mood normal.        Behavior: Behavior normal.        Thought Content: Thought content normal.        Judgment: Judgment normal.     Last depression screening scores PHQ 2/9 Scores 12/26/2020 12/20/2019 11/04/2018  PHQ - 2 Score 0 0 0  PHQ- 9 Score - 3 -   Last fall risk screening Fall Risk  12/20/2019  Falls in the past year? 0  Number falls in past yr: 0  Injury with Fall? 0  Follow up Falls evaluation completed   Last Audit-C alcohol use screening Alcohol Use Disorder Test (AUDIT) 12/20/2019  1.  How often do you have a drink containing alcohol? 3  2. How many drinks containing alcohol do you have on a typical day when you are drinking? 0  3. How often do you have six or more drinks on one occasion? 0  AUDIT-C Score 3  Alcohol Brief Interventions/Follow-up -   A score of 3 or more in women, and 4 or more in men indicates increased risk for alcohol abuse, EXCEPT if all of the points are from question 1   No results found for any visits on 12/26/20.  Assessment & Plan    Routine Health Maintenance and Physical Exam  Exercise Activities and Dietary recommendations Goals   None     Immunization History  Administered Date(s)  Administered  . Td 07/13/2016  . Tdap 08/16/2008    Health Maintenance  Topic Date Due  . COVID-19 Vaccine (1) Never done  . COLONOSCOPY (Pts 45-38yrs Insurance coverage will need to be confirmed)  08/04/2019  . INFLUENZA VACCINE  Never done  . PAP SMEAR-Modifier  10/28/2021  . MAMMOGRAM  12/04/2021  . TETANUS/TDAP  07/13/2026  . Hepatitis C Screening  Completed  . HIV Screening  Completed    Discussed health benefits of physical activity, and encouraged her to engage in regular exercise appropriate for her age and condition.  1. Annual physical exam Normal physical exam today. Will check labs as below and f/u pending lab results. If labs are stable and WNL she will not need to have these rechecked for one year at her next annual physical exam. She is to call the office in the meantime if she has any acute issue, questions or concerns.  2. Encounter for breast cancer screening using non-mammogram modality Breast exam today was normal. There is no family history of breast cancer. She does perform regular self breast exams. Mammogram was ordered as below. Information for [facility] was given to patient so she may schedule her mammogram at her convenience. - MM 3D SCREEN BREAST BILATERAL; Future  3. Nasal congestion Discussed adding saline nasal rinses or flonase for nasal congestion.   4. Right hand pain H/O carpal tunnel in the left/. Similar symptoms with some overlying arthritic symptoms as well. Referral placed back to Dr. Peggye Ley at [facility].  - Ambulatory referral to Orthopedic Surgery  5. Ear fullness, left Could respond to flonase as suggested for nasal congestion.   6. 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 Followed annually by vascular surgery. Will check labs as below and f/u pending results. - CBC with Differential/Platelet - Comprehensive metabolic panel - Hemoglobin A1c - Lipid panel  7. Pulmonary granulomatosis  ([place]) H/O this. Stable. Noted on a CXR incidentally. Will check labs as below and f/u pending results. - CBC with Differential/Platelet - Comprehensive metabolic panel - Hemoglobin A1c - Lipid panel  8. Renovascular hypertension Stable. Continue amlodipine 5mg , losartan 50mg . Will check labs as below and f/u pending results. - CBC with Differential/Platelet - Comprehensive metabolic panel - Hemoglobin A1c - Lipid panel  9. B12 deficiency H/O this. Will check labs as below and f/u pending results. - B12  10. Avitaminosis D H/O this. Will check labs as below and f/u pending results. - Vitamin D (25 hydroxy)   No follow-ups on file.     Reynolds Bowl, PA-C, have reviewed all documentation for this visit. The documentation on 12/30/20 for the exam, diagnosis, procedures, and orders are all accurate and complete.   Anderson Malta  Dorothy Puffer, Hershal Coria  [facility] [phone] (phone) [phone] (fax)  [place]

## 2020-12-26 NOTE — Patient Instructions (Signed)
Flonase sensimist for nasal congestion  Preventive Care 70-58 Years Old, Female Preventive care refers to visits with your health care provider and lifestyle choices that can promote health and wellness. This includes:  A yearly physical exam. This may also be called an annual well check.  Regular dental visits and eye exams.  Immunizations.  Screening for certain conditions.  Healthy lifestyle choices, such as eating a healthy diet, getting regular exercise, not using drugs or products that contain nicotine and tobacco, and limiting alcohol use. What can I expect for my preventive care visit? Physical exam Your health care provider will check your:  Height and weight. This may be used to calculate body mass index (BMI), which tells if you are at a healthy weight.  Heart rate and blood pressure.  Skin for abnormal spots. Counseling Your health care provider may ask you questions about your:  Alcohol, tobacco, and drug use.  Emotional well-being.  Home and relationship well-being.  Sexual activity.  Eating habits.  Work and work Statistician.  Method of birth control.  Menstrual cycle.  Pregnancy history. What immunizations do I need?  Influenza (flu) vaccine  This is recommended every year. Tetanus, diphtheria, and pertussis (Tdap) vaccine  You may need a Td booster every 10 years. Varicella (chickenpox) vaccine  You may need this if you have not been vaccinated. Zoster (shingles) vaccine  You may need this after age 26. Measles, mumps, and rubella (MMR) vaccine  You may need at least one dose of MMR if you were born in 1957 or later. You may also need a second dose. Pneumococcal conjugate (PCV13) vaccine  You may need this if you have certain conditions and were not previously vaccinated. Pneumococcal polysaccharide (PPSV23) vaccine  You may need one or two doses if you smoke cigarettes or if you have certain conditions. Meningococcal conjugate  (MenACWY) vaccine  You may need this if you have certain conditions. Hepatitis A vaccine  You may need this if you have certain conditions or if you travel or work in places where you may be exposed to hepatitis A. Hepatitis B vaccine  You may need this if you have certain conditions or if you travel or work in places where you may be exposed to hepatitis B. Haemophilus influenzae type b (Hib) vaccine  You may need this if you have certain conditions. Human papillomavirus (HPV) vaccine  If recommended by your health care provider, you may need three doses over 6 months. You may receive vaccines as individual doses or as more than one vaccine together in one shot (combination vaccines). Talk with your health care provider about the risks and benefits of combination vaccines. What tests do I need? Blood tests  Lipid and cholesterol levels. These may be checked every 5 years, or more frequently if you are over 73 years old.  Hepatitis C test.  Hepatitis B test. Screening  Lung cancer screening. You may have this screening every year starting at age 83 if you have a 30-pack-year history of smoking and currently smoke or have quit within the past 15 years.  Colorectal cancer screening. All adults should have this screening starting at age 77 and continuing until age 62. Your health care provider may recommend screening at age 24 if you are at increased risk. You will have tests every 1-10 years, depending on your results and the type of screening test.  Diabetes screening. This is done by checking your blood sugar (glucose) after you have not eaten for a  while (fasting). You may have this done every 1-3 years.  Mammogram. This may be done every 1-2 years. Talk with your health care provider about when you should start having regular mammograms. This may depend on whether you have a family history of breast cancer.  BRCA-related cancer screening. This may be done if you have a family  history of breast, ovarian, tubal, or peritoneal cancers.  Pelvic exam and Pap test. This may be done every 3 years starting at age 36. Starting at age 59, this may be done every 5 years if you have a Pap test in combination with an HPV test. Other tests  Sexually transmitted disease (STD) testing.  Bone density scan. This is done to screen for osteoporosis. You may have this scan if you are at high risk for osteoporosis. Follow these instructions at home: Eating and drinking  Eat a diet that includes fresh fruits and vegetables, whole grains, lean protein, and low-fat dairy.  Take vitamin and mineral supplements as recommended by your health care provider.  Do not drink alcohol if: ? Your health care provider tells you not to drink. ? You are pregnant, may be pregnant, or are planning to become pregnant.  If you drink alcohol: ? Limit how much you have to 0-1 drink a day. ? Be aware of how much alcohol is in your drink. In the [place] one drink equals one 12 oz bottle of beer (355 mL), one 5 oz glass of wine (148 mL), or one 1 oz glass of hard liquor (44 mL). Lifestyle  Take daily care of your teeth and gums.  Stay active. Exercise for at least 30 minutes on 5 or more days each week.  Do not use any products that contain nicotine or tobacco, such as cigarettes, e-cigarettes, and chewing tobacco. If you need help quitting, ask your health care provider.  If you are sexually active, practice safe sex. Use a condom or other form of birth control (contraception) in order to prevent pregnancy and STIs (sexually transmitted infections).  If told by your health care provider, take low-dose aspirin daily starting at age 29. What's next?  Visit your health care provider once a year for a well check visit.  Ask your health care provider how often you should have your eyes and teeth checked.  Stay up to date on all vaccines. This information is not intended to replace advice given to you  by your health care provider. Make sure you discuss any questions you have with your health care provider. Document Revised: 08/25/2018 Document Reviewed: 08/25/2018 Elsevier Patient Education  2020 [employer].

## 2020-12-27 LAB — LIPID PANEL
Chol/HDL Ratio: 2.5 ratio (ref 0.0–4.4)
Cholesterol, Total: 234 mg/dL — ABNORMAL HIGH (ref 100–199)
HDL: 95 mg/dL (ref >39)
LDL Chol Calc (NIH): 127 mg/dL — ABNORMAL HIGH (ref 0–99)
Triglycerides: 68 mg/dL (ref 0–149)
VLDL Cholesterol Cal: 12 mg/dL (ref 5–40)

## 2020-12-27 LAB — CBC WITH DIFFERENTIAL/PLATELET
Basophils Absolute: 0.1 10*3/uL (ref 0.0–0.2)
Basos: 1 %
EOS (ABSOLUTE): 0.1 10*3/uL (ref 0.0–0.4)
Eos: 1 %
Hematocrit: 42.8 % (ref 34.0–46.6)
Hemoglobin: 14.4 g/dL (ref 11.1–15.9)
Immature Grans (Abs): 0 10*3/uL (ref 0.0–0.1)
Immature Granulocytes: 1 %
Lymphocytes Absolute: 1.6 10*3/uL (ref 0.7–3.1)
Lymphs: 26 %
MCH: 30.4 pg (ref 26.6–33.0)
MCHC: 33.6 g/dL (ref 31.5–35.7)
MCV: 90 fL (ref 79–97)
Monocytes Absolute: 0.4 10*3/uL (ref 0.1–0.9)
Monocytes: 7 %
Neutrophils Absolute: 3.9 10*3/uL (ref 1.4–7.0)
Neutrophils: 64 %
Platelets: 342 10*3/uL (ref 150–450)
RBC: 4.74 x10E6/uL (ref 3.77–5.28)
RDW: 11.7 % (ref 11.7–15.4)
WBC: 6 10*3/uL (ref 3.4–10.8)

## 2020-12-27 LAB — COMPREHENSIVE METABOLIC PANEL
ALT: 22 IU/L (ref 0–32)
AST: 25 IU/L (ref 0–40)
Albumin/Globulin Ratio: 1.7 (ref 1.2–2.2)
Albumin: 4.7 g/dL (ref 3.8–4.9)
Alkaline Phosphatase: 91 IU/L (ref 44–121)
BUN/Creatinine Ratio: 18 (ref 9–23)
BUN: 16 mg/dL (ref 6–24)
Bilirubin Total: 0.4 mg/dL (ref 0.0–1.2)
CO2: 22 mmol/L (ref 20–29)
Calcium: 9.7 mg/dL (ref 8.7–10.2)
Chloride: 105 mmol/L (ref 96–106)
Creatinine, Ser: 0.88 mg/dL (ref 0.57–1.00)
GFR calc Af Amer: 84 mL/min/{1.73_m2} (ref >59)
GFR calc non Af Amer: 73 mL/min/{1.73_m2} (ref >59)
Globulin, Total: 2.8 g/dL (ref 1.5–4.5)
Glucose: 86 mg/dL (ref 65–99)
Potassium: 5 mmol/L (ref 3.5–5.2)
Sodium: 143 mmol/L (ref 134–144)
Total Protein: 7.5 g/dL (ref 6.0–8.5)

## 2020-12-27 LAB — TSH: TSH: 1.19 u[IU]/mL (ref 0.450–4.500)

## 2020-12-27 LAB — VITAMIN D 25 HYDROXY (VIT D DEFICIENCY, FRACTURES): Vit D, 25-Hydroxy: 52.2 ng/mL (ref 30.0–100.0)

## 2020-12-27 LAB — ABO AND RH: Rh Factor: POSITIVE

## 2020-12-27 LAB — HEMOGLOBIN A1C
Est. average glucose Bld gHb Est-mCnc: 117 mg/dL
Hgb A1c MFr Bld: 5.7 % — ABNORMAL HIGH (ref 4.8–5.6)

## 2020-12-27 LAB — VITAMIN B12: Vitamin B-12: 931 pg/mL (ref 232–1245)

## 2020-12-30 ENCOUNTER — Encounter: Payer: Self-pay | Admitting: Physician Assistant

## 2021-01-07 ENCOUNTER — Ambulatory Visit (INDEPENDENT_AMBULATORY_CARE_PROVIDER_SITE_OTHER): Payer: BC Managed Care – PPO

## 2021-01-07 ENCOUNTER — Encounter (INDEPENDENT_AMBULATORY_CARE_PROVIDER_SITE_OTHER): Payer: Self-pay | Admitting: Vascular Surgery

## 2021-01-07 ENCOUNTER — Other Ambulatory Visit: Payer: Self-pay

## 2021-01-07 ENCOUNTER — Ambulatory Visit (INDEPENDENT_AMBULATORY_CARE_PROVIDER_SITE_OTHER): Payer: BC Managed Care – PPO | Admitting: Vascular Surgery

## 2021-01-07 VITALS — BP 134/81 | HR 63 | Ht 63.0 in | Wt 141.0 lb

## 2021-01-07 DIAGNOSIS — I722 Aneurysm of renal artery: Secondary | ICD-10-CM

## 2021-01-07 DIAGNOSIS — I150 Renovascular hypertension: Secondary | ICD-10-CM | POA: Diagnosis not present

## 2021-01-07 DIAGNOSIS — H93A9 Pulsatile tinnitus, unspecified ear: Secondary | ICD-10-CM | POA: Diagnosis not present

## 2021-01-07 NOTE — Assessment & Plan Note (Signed)
Duplex today shows occlusion of the right renal artery with no hemodynamically significant stenosis or aneurysm seen in the left renal artery.  This is unchanged from her study last year.  Clinically she is doing well.  At this point, we will continue to follow her on an annual basis.  Blood pressure regimen as per primary care physician at this point.

## 2021-01-07 NOTE — Progress Notes (Signed)
MRN : [id number]  Natalie Dickerson is a 60 y.o. (11-15-1961) female who presents with chief complaint of  Chief Complaint  Patient presents with  . Follow-up  .  History of Present Illness: Patient returns today in follow up of renal artery aneurysm and renovascular hypertension.  She underwent endovascular therapy for a peripheral renal artery aneurysm few years ago.  Her right renal artery occluded following this, but she had marked improvement in her blood pressure and has had good blood pressure for the last couple of years.  Her renal function is fine.  She has no complaints today.  Duplex today shows occlusion of the right renal artery with no hemodynamically significant stenosis or aneurysm seen in the left renal artery.  Current Outpatient Medications  Medication Sig Dispense Refill  . amLODipine (NORVASC) 5 MG tablet Take 1 tablet (5 mg total) by mouth every evening. 90 tablet 3  . aspirin EC 81 MG EC tablet Take 1 tablet (81 mg total) by mouth daily. 90 tablet 3  . cholecalciferol (VITAMIN D) 1000 units tablet Take 1,000 Units by mouth daily.    Marland Kitchen losartan (COZAAR) 50 MG tablet Take 1 tablet (50 mg total) by mouth daily. 90 tablet 3  . Multiple Vitamins-Minerals (MULTIVITAMIN WITH MINERALS) tablet Take 1 tablet by mouth daily.    Marland Kitchen pyridOXINE (VITAMIN B-6) 100 MG tablet Take 100 mg daily by mouth.    . vitamin B-12 (CYANOCOBALAMIN) 1000 MCG tablet Take 1,000 mcg by mouth daily.     No current facility-administered medications for this visit.    Past Medical History:  Diagnosis Date  . Cancer ([place])    skin ca  . Hx of basal cell carcinoma 12/06/2013   Left sup forehead    Past Surgical History:  Procedure Laterality Date  . EMBOLIZATION N/A 03/24/2017   Procedure: Embolization;  Surgeon: Algernon Huxley, MD;  Location: [facility];  Service: Cardiovascular;  Laterality: N/A;  . FINGER SURGERY    . PERIPHERAL VASCULAR CATHETERIZATION N/A 01/18/2017   Procedure: Renal  Angiography;  Surgeon: Algernon Huxley, MD;  Location: [facility];  Service: Cardiovascular;  Laterality: N/A;  . PERIPHERAL VASCULAR CATHETERIZATION  01/18/2017   Procedure: Renal Intervention;  Surgeon: Algernon Huxley, MD;  Location: [facility];  Service: Cardiovascular;;  . RENAL ANGIOGRAPHY N/A 03/24/2017   Procedure: Renal Angiography;  Surgeon: Algernon Huxley, MD;  Location: [facility];  Service: Cardiovascular;  Laterality: N/A;     Family History  Problem Relation Age of Onset  . Hypertension Mother   . Breast cancer Mother   . Cancer Mother   . Breast cancer Father   No bleeding disorders or clotting disorders  Social History        Social History   Substance Use Topics   . Smoking status: Former Research scientist (life sciences)   . Smokeless tobacco: Never Used      Comment: Smoked for about 5 years, smoked about 1 pack per day. Quit in 1984   . Alcohol use Yes     Comment: Occasional alcohol use; Drinks 2-4 glasses of wine per week.   No IVDU  No Known Allergies     REVIEW OF SYSTEMS(Negative unless checked)  Constitutional: '[]' ??Weight loss'[]' ??Fever'[]' ??Chills Cardiac:'[]' ??Chest pain'[]' ??Chest pressure'[]' ??Palpitations '[]' ??Shortness of breath when laying flat '[]' ??Shortness of breath at rest '[]' ??Shortness of breath with exertion. Vascular: '[]' ??Pain in legs with walking'[]' ??Pain in legsat rest'[]' ??Pain in legs when laying flat '[]' ??Claudication '[]' ??Pain  in feet when walking '[]' ??Pain in feet at rest '[]' ??Pain in feet when laying flat '[]' ??History of DVT '[]' ??Phlebitis '[]' ??Swelling in legs '[]' ??Varicose veins '[]' ??Non-healing ulcers Pulmonary: '[]' ??Uses home oxygen '[]' ??Productive cough'[]' ??Hemoptysis '[]' ??Wheeze '[]' ??COPD '[]' ??Asthma Neurologic: '[]' ??Dizziness '[]' ??Blackouts '[]' ??Seizures '[]' ??History of stroke '[]' ??History of TIA'[]' ??Aphasia '[]' ??Temporary blindness'[]' ??Dysphagia '[]' ??Weaknessor numbness  in arms '[]' ??Weakness or numbnessin legs Musculoskeletal: '[x]' ??Arthritis '[]' ??Joint swelling '[]' ??Joint pain '[x]' ??Low back pain Hematologic:'[]' ??Easy bruising'[]' ??Easy bleeding '[]' ??Hypercoagulable state '[]' ??Anemic '[]' ??Hepatitis Gastrointestinal:'[]' ??Blood in stool'[]' ??Vomiting blood'[]' ??Gastroesophageal reflux/heartburn'[]' ??Abdominal pain Genitourinary: '[]' ??Chronic kidney disease '[]' ??Difficulturination '[]' ??Frequenturination '[]' ??Burning with urination'[]' ??Hematuria Skin: '[]' ??Rashes '[]' ??Ulcers '[]' ??Wounds Psychological: '[]' ??History of anxiety'[]' ??History of major depression.     Physical Examination  BP 134/81   Pulse 63   Ht '5\' 3"'  (1.6 m)   Wt 141 lb (64 kg)   BMI 24.98 kg/m  Gen:  WD/WN, NAD Head: [place]/AT, No temporalis wasting. Ear/Nose/Throat: Hearing grossly intact, nares w/o erythema or drainage Eyes: Conjunctiva clear. Sclera non-icteric Neck: Supple.  Trachea midline Pulmonary:  Good air movement, no use of accessory muscles.  Cardiac: RRR, no JVD Vascular:  Vessel Right Left  Radial Palpable Palpable       Musculoskeletal: M/S 5/5 throughout.  No deformity or atrophy. No edema. Neurologic: Sensation grossly intact in extremities.  Symmetrical.  Speech is fluent.  Psychiatric: Judgment intact, Mood & affect appropriate for pt's clinical situation. Dermatologic: No rashes or ulcers noted.  No cellulitis or open wounds.       Labs Recent Results (from the past 2160 hour(s))  CBC with Differential/Platelet     Status: None   Collection Time: 12/26/20  3:32 PM  Result Value Ref Range   WBC 6.0 3.4 - 10.8 x10E3/uL   RBC 4.74 3.77 - 5.28 x10E6/uL   Hemoglobin 14.4 11.1 - 15.9 g/dL   Hematocrit 42.8 34.0 - 46.6 %   MCV 90 79 - 97 fL   MCH 30.4 26.6 - 33.0 pg   MCHC 33.6 31.5 - 35.7 g/dL   RDW 11.7 11.7 - 15.4 %   Platelets 342 150 - 450 x10E3/uL   Neutrophils 64 Not Estab. %   Lymphs 26 Not Estab. %   Monocytes 7 Not Estab. %    Eos 1 Not Estab. %   Basos 1 Not Estab. %   Neutrophils Absolute 3.9 1.4 - 7.0 x10E3/uL   Lymphocytes Absolute 1.6 0.7 - 3.1 x10E3/uL   Monocytes Absolute 0.4 0.1 - 0.9 x10E3/uL   EOS (ABSOLUTE) 0.1 0.0 - 0.4 x10E3/uL   Basophils Absolute 0.1 0.0 - 0.2 x10E3/uL   Immature Granulocytes 1 Not Estab. %   Immature Grans (Abs) 0.0 0.0 - 0.1 x10E3/uL  Comprehensive metabolic panel     Status: None   Collection Time: 12/26/20  3:32 PM  Result Value Ref Range   Glucose 86 65 - 99 mg/dL   BUN 16 6 - 24 mg/dL   Creatinine, Ser 0.88 0.57 - 1.00 mg/dL   GFR calc non Af Amer 73 >59 mL/min/1.73   GFR calc Af Amer 84 >59 mL/min/1.73    Comment: **In accordance with recommendations from the NKF-ASN Task force,**   [facility] is in the process of updating its eGFR calculation to the   2021 CKD-EPI creatinine equation that estimates kidney function   without a race variable.    BUN/Creatinine Ratio 18 9 - 23   Sodium 143 134 - 144 mmol/L   Potassium 5.0 3.5 - 5.2 mmol/L   Chloride 105 96 - 106 mmol/L   CO2 22 20 - 29 mmol/L   Calcium 9.7 8.7 -  10.2 mg/dL   Total Protein 7.5 6.0 - 8.5 g/dL   Albumin 4.7 3.8 - 4.9 g/dL   Globulin, Total 2.8 1.5 - 4.5 g/dL   Albumin/Globulin Ratio 1.7 1.2 - 2.2   Bilirubin Total 0.4 0.0 - 1.2 mg/dL   Alkaline Phosphatase 91 44 - 121 IU/L    Comment:               **Please note reference interval change**   AST 25 0 - 40 IU/L   ALT 22 0 - 32 IU/L  Hemoglobin A1c     Status: Abnormal   Collection Time: 12/26/20  3:32 PM  Result Value Ref Range   Hgb A1c MFr Bld 5.7 (H) 4.8 - 5.6 %    Comment:          Prediabetes: 5.7 - 6.4          Diabetes: >6.4          Glycemic control for adults with diabetes: <7.0    Est. average glucose Bld gHb Est-mCnc 117 mg/dL  Lipid panel     Status: Abnormal   Collection Time: 12/26/20  3:32 PM  Result Value Ref Range   Cholesterol, Total 234 (H) 100 - 199 mg/dL   Triglycerides 68 0 - 149 mg/dL   HDL 95 >39 mg/dL   VLDL  Cholesterol Cal 12 5 - 40 mg/dL   LDL Chol Calc (NIH) 127 (H) 0 - 99 mg/dL   Chol/HDL Ratio 2.5 0.0 - 4.4 ratio    Comment:                                   T. Chol/HDL Ratio                                             Men  Women                               1/2 Avg.Risk  3.4    3.3                                   Avg.Risk  5.0    4.4                                2X Avg.Risk  9.6    7.1                                3X Avg.Risk 23.4   11.0   TSH     Status: None   Collection Time: 12/26/20  3:32 PM  Result Value Ref Range   TSH 1.190 0.450 - 4.500 uIU/mL  ABO AND RH      Status: None   Collection Time: 12/26/20  3:32 PM  Result Value Ref Range   ABO Grouping O    Rh Factor Positive     Comment: Please note: Prior records for this patient's ABO / Rh type are not available for additional verification.   B12     Status: None  Collection Time: 12/26/20  3:32 PM  Result Value Ref Range   Vitamin B-12 931 232 - 1,245 pg/mL  Vitamin D (25 hydroxy)     Status: None   Collection Time: 12/26/20  3:32 PM  Result Value Ref Range   Vit D, 25-Hydroxy 52.2 30.0 - 100.0 ng/mL    Comment: Vitamin D deficiency has been defined by the [place] practice guideline as a level of serum 25-OH vitamin D less than 20 ng/mL (1,2). The Endocrine Society went on to further define vitamin D insufficiency as a level between 21 and 29 ng/mL (2). 1. IOM (Institute of Medicine). 2010. Dietary reference    intakes for calcium and D. [place]: The    [employer]. 2. Holick MF, Binkley T[place], Bischoff-Ferrari HA, et al.    Evaluation, treatment, and prevention of vitamin D    deficiency: an Endocrine Society clinical practice    guideline. JCEM. 2011 Jul; 96(7):1911-30.     Radiology No results found.  Assessment/Plan Pulsatile tinnitus Carotid duplex was normal. Recheck as needed  Renovascular hypertension Better after treatment for her  renal artery aneurysm.  Has been backing off her medicine and will discuss this with her primary care physician.  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  This is unchanged from her study last year.  Clinically she is doing well.  At this point, we will continue to follow her on an annual basis.  Blood pressure regimen as per primary care physician at this point.    Leotis Pain, MD  01/07/2021 9:39 AM    This note was created with Dragon medical transcription system.  Any errors from dictation are purely unintentional

## 2021-01-14 DIAGNOSIS — G5601 Carpal tunnel syndrome, right upper limb: Secondary | ICD-10-CM | POA: Diagnosis not present

## 2021-01-20 ENCOUNTER — Other Ambulatory Visit: Payer: Self-pay

## 2021-01-20 ENCOUNTER — Ambulatory Visit
Admission: RE | Admit: 2021-01-20 | Discharge: 2021-01-20 | Disposition: A | Discharge status: Home / Self Care | Payer: BC Managed Care – PPO | Source: Ambulatory Visit | Attending: Physician Assistant | Admitting: Physician Assistant

## 2021-01-20 DIAGNOSIS — Z1239 Encounter for other screening for malignant neoplasm of breast: Secondary | ICD-10-CM

## 2021-01-20 DIAGNOSIS — Z1231 Encounter for screening mammogram for malignant neoplasm of breast: Secondary | ICD-10-CM | POA: Diagnosis not present

## 2021-02-13 ENCOUNTER — Ambulatory Visit (INDEPENDENT_AMBULATORY_CARE_PROVIDER_SITE_OTHER): Payer: BC Managed Care – PPO | Admitting: Dermatology

## 2021-02-13 ENCOUNTER — Other Ambulatory Visit: Payer: Self-pay

## 2021-02-13 ENCOUNTER — Encounter: Payer: Self-pay | Admitting: Dermatology

## 2021-02-13 DIAGNOSIS — L814 Other melanin hyperpigmentation: Secondary | ICD-10-CM

## 2021-02-13 DIAGNOSIS — D229 Melanocytic nevi, unspecified: Secondary | ICD-10-CM

## 2021-02-13 DIAGNOSIS — Z1283 Encounter for screening for malignant neoplasm of skin: Secondary | ICD-10-CM

## 2021-02-13 DIAGNOSIS — L820 Inflamed seborrheic keratosis: Secondary | ICD-10-CM | POA: Diagnosis not present

## 2021-02-13 DIAGNOSIS — Z85828 Personal history of other malignant neoplasm of skin: Secondary | ICD-10-CM | POA: Diagnosis not present

## 2021-02-13 DIAGNOSIS — L570 Actinic keratosis: Secondary | ICD-10-CM

## 2021-02-13 DIAGNOSIS — L578 Other skin changes due to chronic exposure to nonionizing radiation: Secondary | ICD-10-CM | POA: Diagnosis not present

## 2021-02-13 DIAGNOSIS — D1800 Hemangioma unspecified site: Secondary | ICD-10-CM

## 2021-02-13 DIAGNOSIS — L821 Other seborrheic keratosis: Secondary | ICD-10-CM

## 2021-02-13 NOTE — Progress Notes (Signed)
Follow-Up Visit   Subjective  Natalie Dickerson is a 60 y.o. female who presents for the following: Actinic Keratosis (Face, chest, R forehead - check for new or persistent skin leisons), Lesion (Of the L forehead - new, patient is concerned and would like it checked), and Annual Exam (Hx BCC ). The patient presents for Total-Body Skin Exam (TBSE) for skin cancer screening and mole check.  The following portions of the chart were reviewed this encounter and updated as appropriate:   Tobacco  Allergies  Meds  Problems  Med Hx  Surg Hx  Fam Hx     Review of Systems:  No other skin or systemic complaints except as noted in HPI or Assessment and Plan.  Objective  Well appearing patient in no apparent distress; mood and affect are within normal limits.  A full examination was performed including scalp, head, eyes, ears, nose, lips, neck, chest, axillae, abdomen, back, buttocks, bilateral upper extremities, bilateral lower extremities, hands, feet, fingers, toes, fingernails, and toenails. All findings within normal limits unless otherwise noted below.  Objective  R forehead x 1, L cheek x 2, L chest x 1 (4): Erythematous thin papules/macules with gritty scale.   Objective  L lat forehead x 1,B/L hand x 4, legs x 11, chest x 3 (19): Erythematous keratotic or waxy stuck-on papule or plaque.   Assessment & Plan  AK (actinic keratosis) (4) R forehead x 1, L cheek x 2, L chest x 1  Destruction of lesion - R forehead x 1, L cheek x 2, L chest x 1 Complexity: simple   Destruction method: cryotherapy   Informed consent: discussed and consent obtained   Timeout:  patient name, date of birth, surgical site, and procedure verified Lesion destroyed using liquid nitrogen: Yes   Region frozen until ice ball extended beyond lesion: Yes   Outcome: patient tolerated procedure well with no complications   Post-procedure details: wound care instructions given    Inflamed seborrheic keratosis  (19) L lat forehead x 1,B/L hand x 4, legs x 11, chest x 3  Destruction of lesion - L lat forehead x 1,B/L hand x 4, legs x 11, chest x 3 Complexity: simple   Destruction method: cryotherapy   Informed consent: discussed and consent obtained   Timeout:  patient name, date of birth, surgical site, and procedure verified Lesion destroyed using liquid nitrogen: Yes   Region frozen until ice ball extended beyond lesion: Yes   Outcome: patient tolerated procedure well with no complications   Post-procedure details: wound care instructions given    Skin cancer screening   Lentigines - Scattered tan macules - Discussed due to sun exposure - Benign, observe - Call for any changes  Seborrheic Keratoses - Stuck-on, waxy, tan-brown papules and plaques  - Discussed benign etiology and prognosis. - Observe - Call for any changes  Melanocytic Nevi - Tan-brown and/or pink-flesh-colored symmetric macules and papules - Benign appearing on exam today - Observation - Call clinic for new or changing moles - Recommend daily use of broad spectrum spf 30+ sunscreen to sun-exposed areas.   Hemangiomas - Red papules - Discussed benign nature - Observe - Call for any changes  Actinic Damage - Chronic, secondary to cumulative UV/sun exposure - diffuse scaly erythematous macules with underlying dyspigmentation - Recommend daily broad spectrum sunscreen SPF 30+ to sun-exposed areas, reapply every 2 hours as needed.  - Call for new or changing lesions.  History of Basal Cell Carcinoma of the  Skin - No evidence of recurrence today - Recommend regular full body skin exams - Recommend daily broad spectrum sunscreen SPF 30+ to sun-exposed areas, reapply every 2 hours as needed.  - Call if any new or changing lesions are noted between office visits  Return in about 6 months (around 08/13/2021) for sun exposed areas.  Luther Redo, CMA, am acting as scribe for Sarina Ser, MD  .  Documentation: I have reviewed the above documentation for accuracy and completeness, and I agree with the above.  Sarina Ser, MD

## 2021-02-16 ENCOUNTER — Encounter: Payer: Self-pay | Admitting: Dermatology

## 2021-03-25 IMAGING — MG MM DIGITAL SCREENING BILAT W/ TOMO AND CAD
8 series · 9 of 24 positions shown · non-contrast
Comparison: Previous exam(s).

CLINICAL DATA: Screening.

EXAM:
DIGITAL SCREENING BILATERAL MAMMOGRAM WITH TOMO AND CAD

[L CC synth-2D]
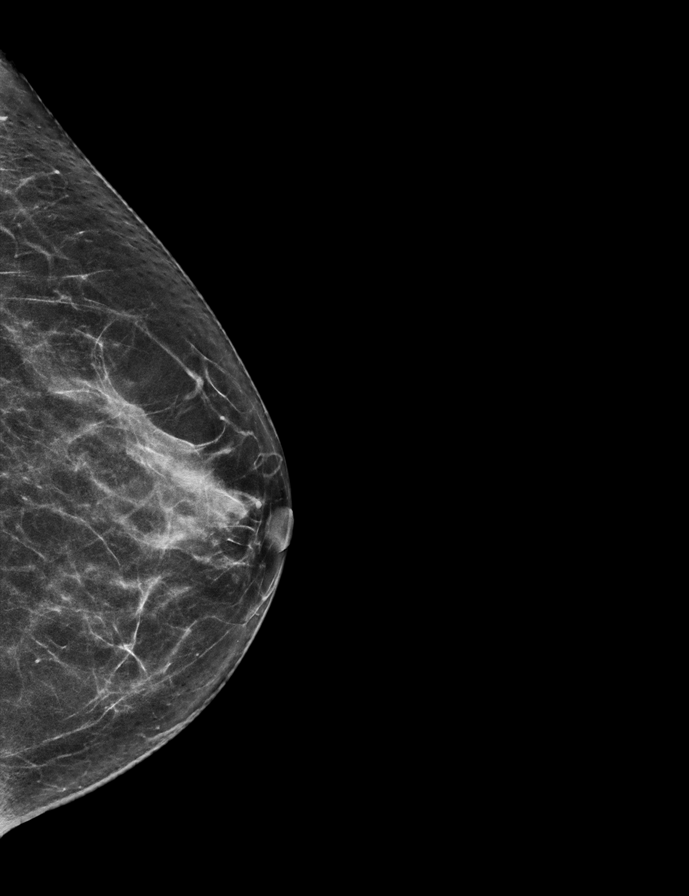

[R CC synth-2D]
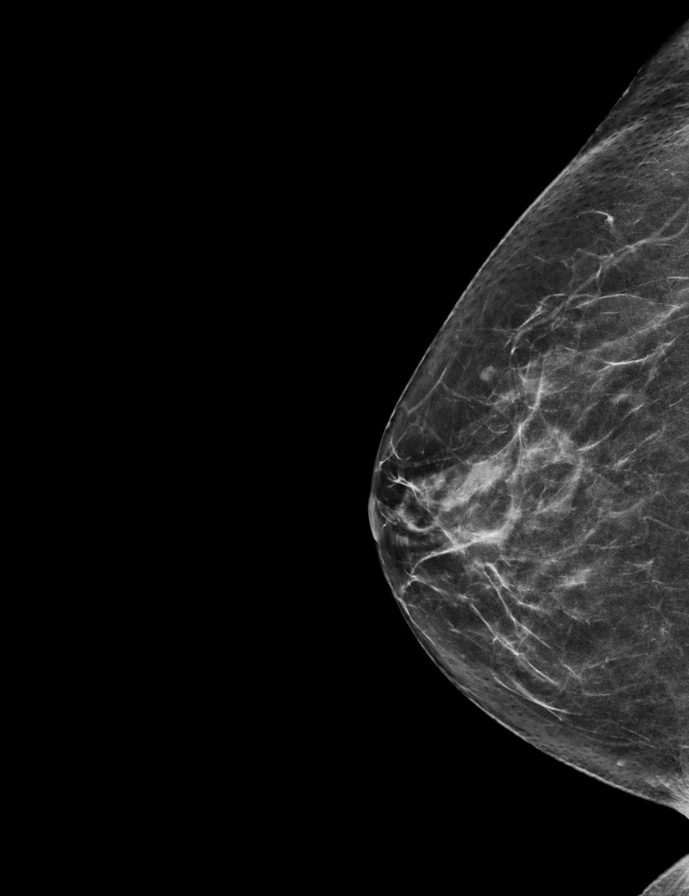

[L MLO synth-2D]
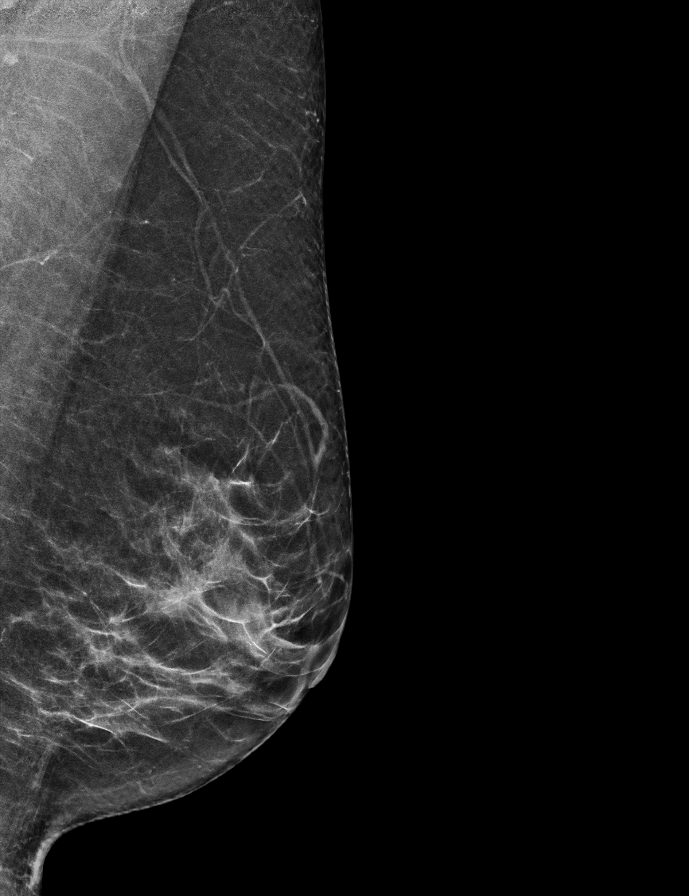

[R MLO synth-2D]
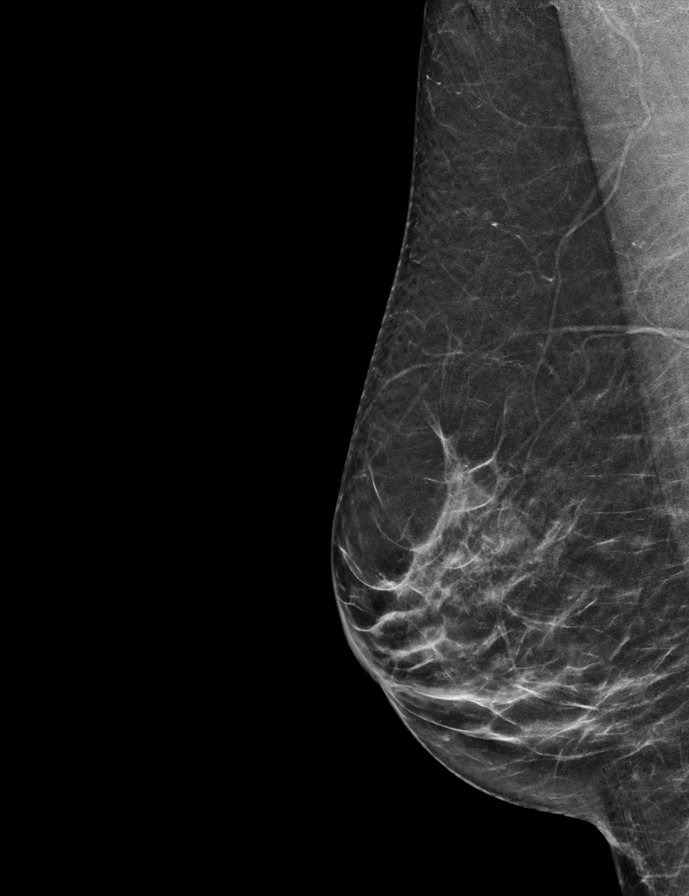

[L MLO tomo · 2 of 54 frames shown]
[frame 18/54]
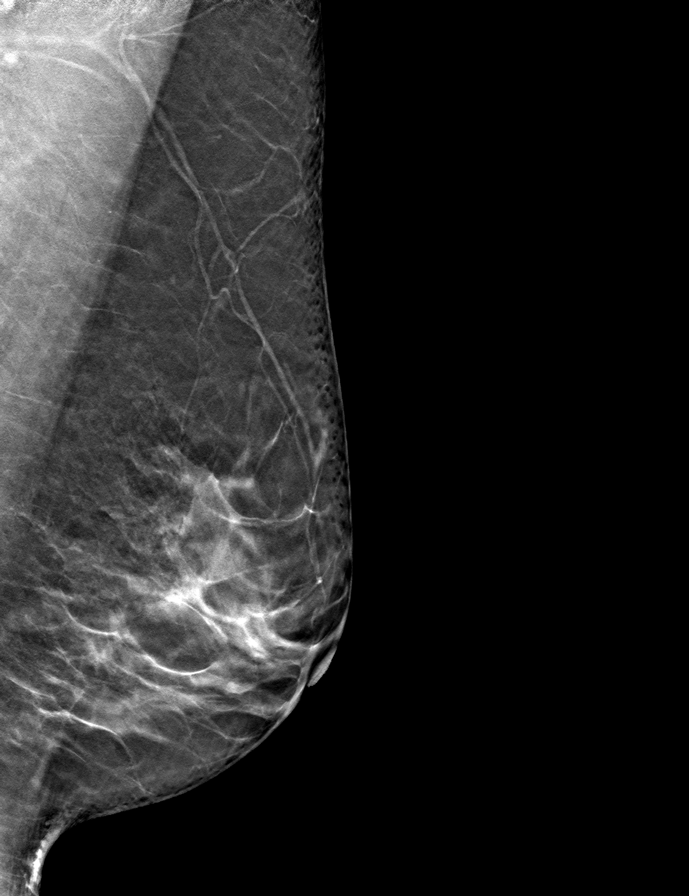
[frame 27/54]
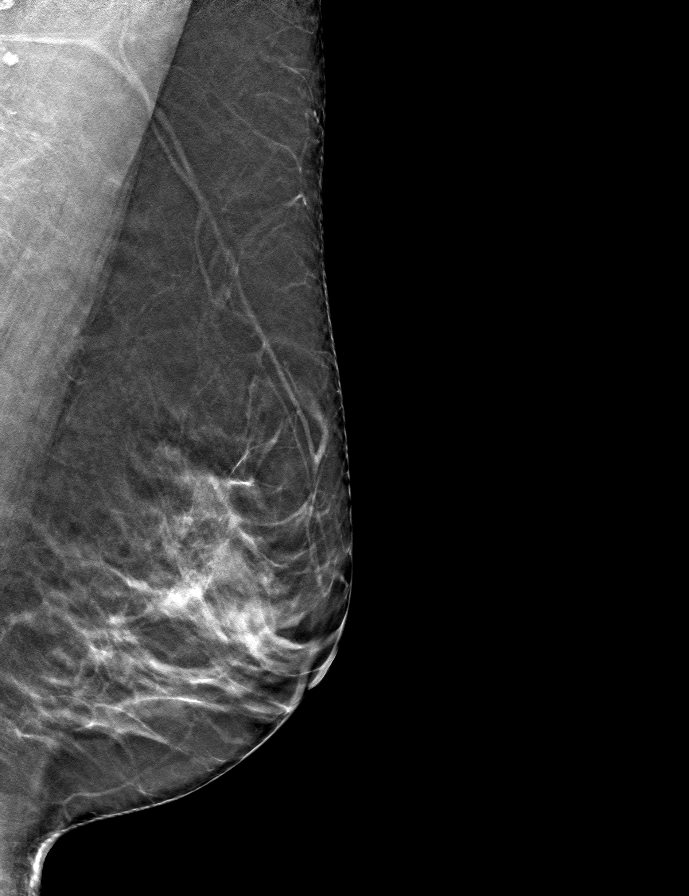

[R CC tomo · tomo slice 29/58.0]
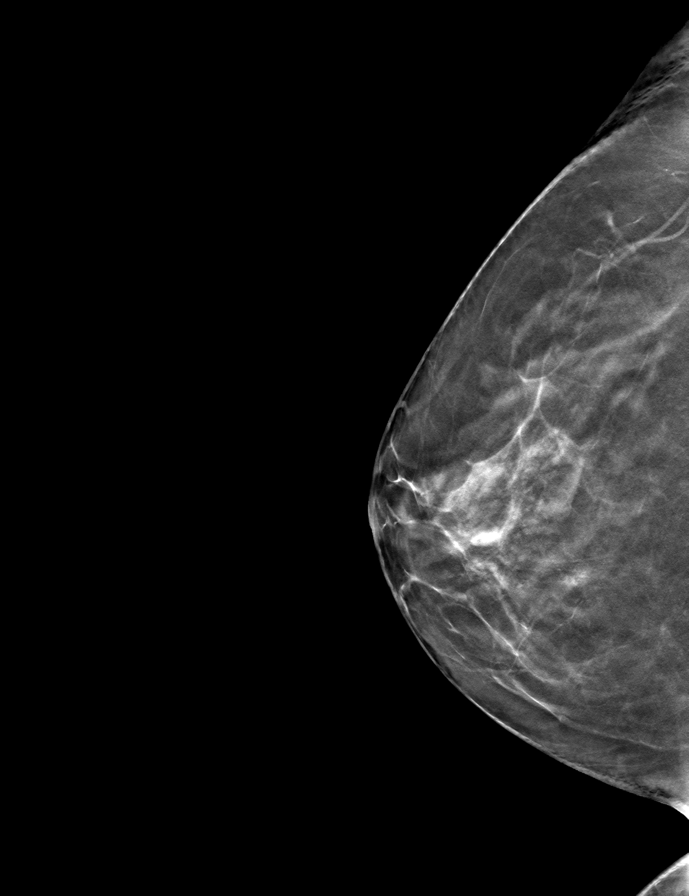

[L CC tomo · tomo slice 29/58.0]
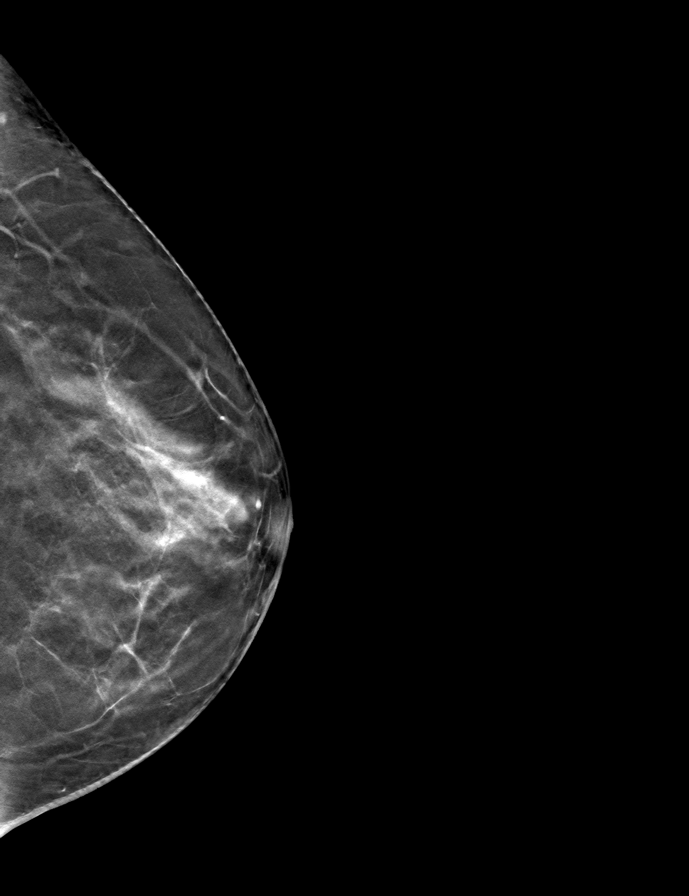

[R MLO tomo · tomo slice 27/53.0]
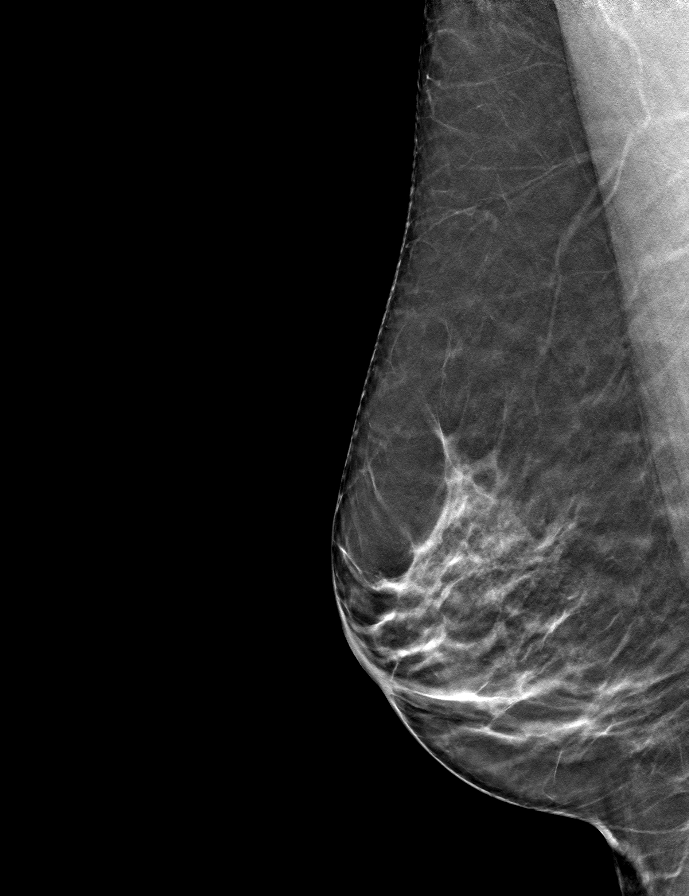

[9 of 24 positions shown; findings below may reference images not displayed]

ACR Breast Density Category c: The breast tissue is heterogeneously
dense, which may obscure small masses.
FINDINGS: There are no findings suspicious for malignancy. The images were
evaluated with computer-aided detection.
IMPRESSION: No mammographic evidence of malignancy. A result letter of this
screening mammogram will be mailed directly to the patient.

RECOMMENDATION:
Screening mammogram in one year. (Code:[id number]-[id number])

BI-RADS CATEGORY  1: Negative.

## 2021-08-21 ENCOUNTER — Ambulatory Visit: Payer: BC Managed Care – PPO | Admitting: Dermatology

## 2021-09-10 ENCOUNTER — Other Ambulatory Visit: Payer: Self-pay | Admitting: Physician Assistant

## 2021-09-10 DIAGNOSIS — I150 Renovascular hypertension: Secondary | ICD-10-CM

## 2021-10-06 ENCOUNTER — Other Ambulatory Visit: Payer: Self-pay | Admitting: Family Medicine

## 2021-10-06 DIAGNOSIS — I150 Renovascular hypertension: Secondary | ICD-10-CM

## 2021-10-16 ENCOUNTER — Ambulatory Visit: Payer: BC Managed Care – PPO | Admitting: Dermatology

## 2021-10-21 DIAGNOSIS — M25511 Pain in right shoulder: Secondary | ICD-10-CM | POA: Diagnosis not present

## 2021-10-23 ENCOUNTER — Other Ambulatory Visit: Payer: Self-pay | Admitting: Physician Assistant

## 2021-10-23 NOTE — Progress Notes (Signed)
error 

## 2021-11-12 DIAGNOSIS — M25511 Pain in right shoulder: Secondary | ICD-10-CM | POA: Diagnosis not present

## 2021-11-16 ENCOUNTER — Other Ambulatory Visit: Payer: Self-pay | Admitting: Family Medicine

## 2021-11-16 DIAGNOSIS — I150 Renovascular hypertension: Secondary | ICD-10-CM

## 2021-11-17 ENCOUNTER — Other Ambulatory Visit: Payer: Self-pay | Admitting: Family Medicine

## 2021-11-17 DIAGNOSIS — I150 Renovascular hypertension: Secondary | ICD-10-CM

## 2021-11-17 NOTE — Telephone Encounter (Signed)
I called pt to schedule her yearly check up.   Left voicemail for her to call back and schedule her yearly physical. I returned the amlodipine refill request to [facility] for provider to review for refills since a 30 day courtesy supply has already been given.

## 2021-11-18 NOTE — Telephone Encounter (Signed)
Requested medications are due for refill today yes  Requested medications are on the active medication list yes  Last refill 11/03/21  Last visit 12/26/21  Future visit scheduled no  Notes to clinic failed protocol of labs within 180 days, visit within 6 months, no upcoming visit, please schedule. Requested Prescriptions  Pending Prescriptions Disp Refills   losartan (COZAAR) 50 MG tablet [Pharmacy Med Name: Losartan Potassium 50 MG Oral Tablet] 30 tablet 11    Sig: TAKE 1 TABLET BY MOUTH  DAILY     Cardiovascular:  Angiotensin Receptor Blockers Failed - 11/17/2021 10:35 PM      Failed - Cr in normal range and within 180 days    Creat  Date Value Ref Range Status  10/12/2017 1.01 0.50 - 1.05 mg/dL Final    Comment:    For patients >50 years of age, the reference limit for Creatinine is approximately 13% higher for people identified as African-American. .    Creatinine, Ser  Date Value Ref Range Status  12/26/2020 0.88 0.57 - 1.00 mg/dL Final          Failed - K in normal range and within 180 days    Potassium  Date Value Ref Range Status  12/26/2020 5.0 3.5 - 5.2 mmol/L Final          Failed - Valid encounter within last 6 months    Recent Outpatient Visits           10 months ago Annual physical exam   [place], Natalie Dickerson, [place]   1 year ago Ganglion cyst of right foot   [facility] [place], Natalie Dickerson, [place]   1 year ago [place], Natalie Bucy, [place]   1 year ago Annual physical exam   [facility] Natalie Dickerson, [place]   3 years ago Annual physical exam   [place], Natalie Dickerson, [place]       Future Appointments             In 2 months [name], MD [place] - Patient is not pregnant      Passed - Last BP in normal range    BP Readings from Last 1 Encounters:  01/07/21 134/81

## 2021-12-01 ENCOUNTER — Ambulatory Visit (INDEPENDENT_AMBULATORY_CARE_PROVIDER_SITE_OTHER): Payer: BC Managed Care – PPO | Admitting: Dermatology

## 2021-12-01 ENCOUNTER — Other Ambulatory Visit: Payer: Self-pay

## 2021-12-01 DIAGNOSIS — L820 Inflamed seborrheic keratosis: Secondary | ICD-10-CM

## 2021-12-01 DIAGNOSIS — L578 Other skin changes due to chronic exposure to nonionizing radiation: Secondary | ICD-10-CM

## 2021-12-01 DIAGNOSIS — L570 Actinic keratosis: Secondary | ICD-10-CM

## 2021-12-01 DIAGNOSIS — L304 Erythema intertrigo: Secondary | ICD-10-CM | POA: Diagnosis not present

## 2021-12-01 DIAGNOSIS — L308 Other specified dermatitis: Secondary | ICD-10-CM | POA: Diagnosis not present

## 2021-12-01 MED ORDER — MOMETASONE FUROATE 0.1 % EX CREA
TOPICAL_CREAM | CUTANEOUS | 1 refills | Status: DC
Start: 2021-12-01 — End: 2022-05-12

## 2021-12-01 MED ORDER — FLUOROURACIL 5 % EX CREA: TOPICAL_CREAM | Freq: Two times a day (BID) | CUTANEOUS | 2 refills | Status: DC

## 2021-12-01 NOTE — Patient Instructions (Addendum)
Start 5-fluorouracil/calcipotriene cream twice a day for 4-7 days to affected areas including forehead, chest. Prescription sent to [facility]. Patient provided with contact information for pharmacy and advised the pharmacy will mail the prescription to their home. Patient provided with handout reviewing treatment course and side effects and advised to call or message [place] on MyChart with any concerns.  Cryotherapy Aftercare  Wash gently with soap and water everyday.   Apply Vaseline and Band-Aid daily until healed.    Recommend daily broad spectrum sunscreen SPF 30+ to sun-exposed areas, reapply every 2 hours as needed. Call for new or changing lesions.  Staying in the shade or wearing long sleeves, sun glasses (UVA+UVB protection) and wide brim hats (4-inch brim around the entire circumference of the hat) are also recommended for sun protection.    If You Need Anything After Your Visit  If you have any questions or concerns for your doctor, please call our main line at [phone] and press option 4 to reach your doctor's medical assistant. If no one answers, please leave a voicemail as directed and we will return your call as soon as possible. Messages left after 4 pm will be answered the following business day.   You may also send [place] a message via [place]. We typically respond to MyChart messages within 1-2 business days.  For prescription refills, please ask your pharmacy to contact our office. Our fax number is [phone].  If you have an urgent issue when the clinic is closed that cannot wait until the next business day, you can page your doctor at the number below.    Please note that while we do our best to be available for urgent issues outside of office hours, we are not available 24/7.   If you have an urgent issue and are unable to reach [place], you may choose to seek medical care at your doctor's office, retail clinic, urgent care center, or emergency room.  If you have a  medical emergency, please immediately call 911 or go to the emergency department.  Pager Numbers  - Dr. Nehemiah Massed: [phone]  - Dr. Laurence Ferrari: [phone]  - Dr. Nicole Kindred: [phone]  In the event of inclement weather, please call our main line at [phone] for an update on the status of any delays or closures.  Dermatology Medication Tips: Please keep the boxes that topical medications come in in order to help keep track of the instructions about where and how to use these. Pharmacies typically print the medication instructions only on the boxes and not directly on the medication tubes.   If your medication is too expensive, please contact our office at [phone] option 4 or send [place] a message through [place].   We are unable to tell what your co-pay for medications will be in advance as this is different depending on your insurance coverage. However, we may be able to find a substitute medication at lower cost or fill out paperwork to get insurance to cover a needed medication.   If a prior authorization is required to get your medication covered by your insurance company, please allow [place] 1-2 business days to complete this process.  Drug prices often vary depending on where the prescription is filled and some pharmacies may offer cheaper prices.  The website www.goodrx.com contains coupons for medications through different pharmacies. The prices here do not account for what the cost may be with help from insurance (it may be cheaper with your insurance), but the website can give you the  price if you did not use any insurance.  - You can print the associated coupon and take it with your prescription to the pharmacy.  - You may also stop by our office during regular business hours and pick up a GoodRx coupon card.  - If you need your prescription sent electronically to a different pharmacy, notify our office through [facility] or by phone at [phone] option 4.     Si  Usted Necesita Algo Despus de Su Visita  Tambin puede enviarnos un mensaje a travs de Pharmacist, community. Por lo general respondemos a los mensajes de MyChart en el transcurso de 1 a 2 das hbiles.  Para renovar recetas, por favor pida a su farmacia que se ponga en contacto con nuestra oficina. Harland Dingwall de fax es [place] [phone].  Si tiene un asunto urgente cuando la clnica est cerrada y que no puede esperar hasta el siguiente da hbil, puede llamar/localizar a su doctor(a) al nmero que aparece a continuacin.   Por favor, tenga en cuenta que aunque hacemos todo lo posible para estar disponibles para asuntos urgentes fuera del horario de [place], no estamos disponibles las 24 horas del da, los 7 das de la [place].   Si tiene un problema urgente y no puede comunicarse con nosotros, puede optar por buscar atencin mdica  en el consultorio de su doctor(a), en una clnica privada, en un centro de atencin urgente o en una sala de emergencias.  Si tiene Engineering geologist, por favor llame inmediatamente al 911 o vaya a la sala de emergencias.  Nmeros de bper  - Dr. Nehemiah Massed: [phone]  - Dra. Moye: [phone]  - Dra. Nicole Kindred: [phone]  En caso de inclemencias del [place], por favor llame a Johnsie Kindred principal al [phone] para una actualizacin sobre el [place] de cualquier retraso o cierre.  Consejos para la medicacin en dermatologa: Por favor, guarde las cajas en las que vienen los medicamentos de uso tpico para ayudarle a seguir las instrucciones sobre dnde y cmo usarlos. Las farmacias generalmente imprimen las instrucciones del medicamento slo en las cajas y no directamente en los tubos del [place].   Si su medicamento es muy caro, por favor, pngase en contacto con Zigmund Daniel llamando al [phone] y presione la opcin 4 o envenos un mensaje a travs de Pharmacist, community.   No podemos decirle cul ser su copago por los medicamentos por adelantado ya que  esto es diferente dependiendo de la cobertura de su seguro. Sin embargo, es posible que podamos encontrar un medicamento sustituto a Electrical engineer un formulario para que el seguro cubra el medicamento que se considera necesario.   Si se requiere una autorizacin previa para que su compaa de seguros [place] su medicamento, por favor permtanos de 1 a 2 das hbiles para completar este proceso.  Los precios de los medicamentos varan con frecuencia dependiendo del Environmental consultant de dnde se surte la receta y alguna farmacias pueden ofrecer precios ms baratos.  El sitio web www.goodrx.com tiene cupones para medicamentos de Airline pilot. Los precios aqu no tienen en cuenta lo que podra costar con la ayuda del seguro (puede ser ms barato con su seguro), pero el sitio web puede darle el precio si no utiliz Research scientist (physical sciences).  - Puede imprimir el cupn correspondiente y llevarlo con su receta a la farmacia.  - Tambin puede pasar por nuestra oficina durante el horario de atencin regular y Charity fundraiser una tarjeta de cupones de GoodRx.  - Si necesita que  su receta se enve electrnicamente a una farmacia diferente, informe a nuestra oficina a travs de MyChart de [place] o por telfono llamando al [phone] y presione la opcin 4.

## 2021-12-01 NOTE — Progress Notes (Signed)
Follow-Up Visit   Subjective  Natalie Dickerson is a 60 y.o. female who presents for the following: Skin Problem (Patient here today for red spots at face that are burning. Patient advises last time she did PDT her entire face got red but this time she only had red spots come up. We have no record of PDT in Epic but patient feels like she has had once since March of 2021. Patient also with a spot at right ankle and a few spots at chest that she would like looked at. Patient missed her annual exam and was asked to be worked in today for the spots at her face. ). She has an appointment in January for her annual exam.   The following portions of the chart were reviewed this encounter and updated as appropriate:   Tobacco  Allergies  Meds  Problems  Med Hx  Surg Hx  Fam Hx     Review of Systems:  No other skin or systemic complaints except as noted in HPI or Assessment and Plan.  Objective  Well appearing patient in no apparent distress; mood and affect are within normal limits.  A focused examination was performed including face, chest, back, right leg. Relevant physical exam findings are noted in the Assessment and Plan.  face Erythematous thin papules/macules with gritty scale.   right calf Erythematous keratotic or waxy stuck-on papule or plaque.   inframammary Pinkness and maceration  Back Scaly pink papules   Assessment & Plan  AK (actinic keratosis) face Patient advised areas are so minimal it would not be ideal to treat with LN2. Recommend patient use topicals at home to treat at home.  fluorouracil (EFUDEX) 5 % cream - face Apply topically 2 (two) times daily. Twice daily as directed Actinic Damage - Severe, confluent actinic changes with pre-cancerous actinic keratoses  - Severe, chronic, not at goal, secondary to cumulative UV radiation exposure over time - diffuse scaly erythematous macules and papules with underlying dyspigmentation - Discussed Prescription  "Field Treatment" for Severe, Chronic Confluent Actinic Changes with Pre-Cancerous Actinic Keratoses Field treatment involves treatment of an entire area of skin that has confluent Actinic Changes (Sun/ Ultraviolet light damage) and PreCancerous Actinic Keratoses by method of PhotoDynamic Therapy (PDT) and/or prescription Topical Chemotherapy agents such as 5-fluorouracil, 5-fluorouracil/calcipotriene, and/or imiquimod.  The purpose is to decrease the number of clinically evident and subclinical PreCancerous lesions to prevent progression to development of skin cancer by chemically destroying early precancer changes that may or may not be visible.  It has been shown to reduce the risk of developing skin cancer in the treated area. As a result of treatment, redness, scaling, crusting, and open sores may occur during treatment course. One or more than one of these methods may be used and may have to be used several times to control, suppress and eliminate the PreCancerous changes. Discussed treatment course, expected reaction, and possible side effects. - Recommend daily broad spectrum sunscreen SPF 30+ to sun-exposed areas, reapply every 2 hours as needed.  - Staying in the shade or wearing long sleeves, sun glasses (UVA+UVB protection) and wide brim hats (4-inch brim around the entire circumference of the hat) are also recommended. - Call for new or changing lesions. Start 5-fluorouracil/calcipotriene cream twice a day for 4-7 days to affected areas including forehead, chest. Prescription sent to [facility]. Patient provided with contact information for pharmacy and advised the pharmacy will mail the prescription to their home. Patient provided with handout reviewing  treatment course and side effects and advised to call or message [place] on MyChart with any concerns.  Inflamed seborrheic keratosis right calf Destruction of lesion - right calf Complexity: simple   Destruction method: cryotherapy    Informed consent: discussed and consent obtained   Timeout:  patient name, date of birth, surgical site, and procedure verified Lesion destroyed using liquid nitrogen: Yes   Region frozen until ice ball extended beyond lesion: Yes   Outcome: patient tolerated procedure well with no complications   Post-procedure details: wound care instructions given    Erythema intertrigo inframammary Intertrigo is a chronic recurrent rash that occurs in skin fold areas that may be associated with friction; heat; moisture; yeast; fungus; and bacteria.  It is exacerbated by increased movement / activity; sweating; and higher atmospheric temperature.  Start mometasone cream twice daily for up to 5 days a week as needed. Avoid applying to face, groin, and axilla. Use as directed. Long-term use can cause thinning of the skin.  Topical steroids (such as triamcinolone, fluocinolone, fluocinonide, mometasone, clobetasol, halobetasol, betamethasone, hydrocortisone) can cause thinning and lightening of the skin if they are used for too long in the same area. Your physician has selected the right strength medicine for your problem and area affected on the body. Please use your medication only as directed by your physician to prevent side effects.   mometasone (ELOCON) 0.1 % cream - inframammary Twice daily for up to 5 days a week as needed for rash. Avoid applying to face, groin, and axilla. Use as directed. Long-term use can cause thinning of the skin.  Other eczema Back Start mometasone twice daily for up to 5 days a week as needed for rash. Avoid applying to face, groin, and axilla. Use as directed. Long-term use can cause thinning of the skin.  Atopic dermatitis (eczema) is a chronic, relapsing, pruritic condition that can significantly affect quality of life. It is often associated with allergic rhinitis and/or asthma and can require treatment with topical medications, phototherapy, or in severe cases biologic  injectable medication (Dupixent; Adbry) or Oral JAK inhibitors.  Topical steroids (such as triamcinolone, fluocinolone, fluocinonide, mometasone, clobetasol, halobetasol, betamethasone, hydrocortisone) can cause thinning and lightening of the skin if they are used for too long in the same area. Your physician has selected the right strength medicine for your problem and area affected on the body. Please use your medication only as directed by your physician to prevent side effects.   Return for TBSE, as scheduled.  Graciella Belton, RMA, am acting as scribe for Sarina Ser, MD . Documentation: I have reviewed the above documentation for accuracy and completeness, and I agree with the above.  Sarina Ser, MD

## 2021-12-04 NOTE — Progress Notes (Signed)
Established patient visit   [name]   DOB: 1961-07-09   60 y.o. Female  MRN: [id number] Visit Date: 12/05/2021  Today's healthcare provider: Gwyneth Sprout, FNP   Chief Complaint  Patient presents with   Hypertension   Hand Pain    Patient reports for the past 30 days she has been experiencing tightness in her right hand.    Subjective    HPI HPI     Hand Pain    Additional comments: Patient reports for the past 30 days she has been experiencing tightness in her right hand.       Last edited by Minette Headland, CMA on 12/05/2021  2:44 PM.      Hypertension, follow-up  BP Readings from Last 3 Encounters:  12/05/21 128/60  01/07/21 134/81  12/26/20 118/67   Wt Readings from Last 3 Encounters:  12/05/21 139 lb 14.4 oz (63.5 kg)  01/07/21 141 lb (64 kg)  12/26/20 140 lb 14.4 oz (63.9 kg)     She was last seen for hypertension 1 years ago. (12/26/20) BP at that visit was 118/67. Management since that visit includes continue amlodipine 5mg , losartan 50mg .  She reports excellent compliance with treatment. She is not having side effects.  She is following a  poor  diet. She is not exercising. She does not smoke.  Use of agents associated with hypertension: NSAIDS.   Outside blood pressures are not being checked. Symptoms: No chest pain No chest pressure  No palpitations No syncope  No dyspnea No orthopnea  No paroxysmal nocturnal dyspnea No lower extremity edema   Pertinent labs: Lab Results  Component Value Date   CHOL 234 (H) 12/26/2020   HDL 95 12/26/2020   LDLCALC 127 (H) 12/26/2020   TRIG 68 12/26/2020   CHOLHDL 2.5 12/26/2020   Lab Results  Component Value Date   NA 143 12/26/2020   K 5.0 12/26/2020   CREATININE 0.88 12/26/2020   GFRNONAA 73 12/26/2020   GLUCOSE 86 12/26/2020   TSH 1.190 12/26/2020     The 10-year ASCVD risk score (Arnett DK, et al., 2019) is: 2.7%    ---------------------------------------------------------------------------------------------------   Medications: Outpatient Medications Prior to Visit  Medication Sig   aspirin EC 81 MG EC tablet Take 1 tablet (81 mg total) by mouth daily.   cholecalciferol (VITAMIN D) 1000 units tablet Take 1,000 Units by mouth daily.   fluorouracil (EFUDEX) 5 % cream Apply topically 2 (two) times daily. Twice daily as directed   mometasone (ELOCON) 0.1 % cream Twice daily for up to 5 days a week as needed for rash. Avoid applying to face, groin, and axilla. Use as directed. Long-term use can cause thinning of the skin.   Multiple Vitamins-Minerals (MULTIVITAMIN WITH MINERALS) tablet Take 1 tablet by mouth daily.   [DISCONTINUED] amLODipine (NORVASC) 5 MG tablet TAKE 1 TABLET BY MOUTH IN  THE EVENING   [DISCONTINUED] losartan (COZAAR) 50 MG tablet TAKE 1 TABLET BY MOUTH  DAILY   [DISCONTINUED] pyridOXINE (VITAMIN B-6) 100 MG tablet Take 100 mg daily by mouth.   [DISCONTINUED] vitamin B-12 (CYANOCOBALAMIN) 1000 MCG tablet Take 1,000 mcg by mouth daily.   No facility-administered medications prior to visit.    Review of Systems  Last CBC Lab Results  Component Value Date   WBC 6.0 12/26/2020   HGB 14.4 12/26/2020   HCT 42.8 12/26/2020   MCV 90 12/26/2020   MCH 30.4 12/26/2020   RDW 11.7  12/26/2020   PLT 342 16/12/930   Last metabolic panel Lab Results  Component Value Date   GLUCOSE 86 12/26/2020   NA 143 12/26/2020   K 5.0 12/26/2020   CL 105 12/26/2020   CO2 22 12/26/2020   BUN 16 12/26/2020   CREATININE 0.88 12/26/2020   GFRNONAA 73 12/26/2020   CALCIUM 9.7 12/26/2020   PROT 7.5 12/26/2020   ALBUMIN 4.7 12/26/2020   LABGLOB 2.8 12/26/2020   AGRATIO 1.7 12/26/2020   BILITOT 0.4 12/26/2020   ALKPHOS 91 12/26/2020   AST 25 12/26/2020   ALT 22 12/26/2020   ANIONGAP 7 03/24/2017   Last lipids Lab Results  Component Value Date   CHOL 234 (H) 12/26/2020   HDL 95 12/26/2020    LDLCALC 127 (H) 12/26/2020   TRIG 68 12/26/2020   CHOLHDL 2.5 12/26/2020   Last hemoglobin A1c Lab Results  Component Value Date   HGBA1C 5.7 (H) 12/26/2020   Last thyroid functions Lab Results  Component Value Date   TSH 1.190 12/26/2020   Last vitamin D Lab Results  Component Value Date   VD25OH 52.2 12/26/2020   Last vitamin B12 and Folate Lab Results  Component Value Date   VITAMINB12 931 12/26/2020       Objective    BP 128/60   Pulse 66   Resp 16   Wt 139 lb 14.4 oz (63.5 kg)   SpO2 100%   BMI 24.78 kg/m  BP Readings from Last 3 Encounters:  12/05/21 128/60  01/07/21 134/81  12/26/20 118/67   Wt Readings from Last 3 Encounters:  12/05/21 139 lb 14.4 oz (63.5 kg)  01/07/21 141 lb (64 kg)  12/26/20 140 lb 14.4 oz (63.9 kg)      Physical Exam Vitals and nursing note reviewed.  Constitutional:      General: She is not in acute distress.    Appearance: Normal appearance. She is normal weight. She is not ill-appearing, toxic-appearing or diaphoretic.  HENT:     Head: Normocephalic and atraumatic.  Cardiovascular:     Rate and Rhythm: Normal rate and regular rhythm.     Pulses: Normal pulses.     Heart sounds: Normal heart sounds. No murmur heard.   No friction rub. No gallop.  Pulmonary:     Effort: Pulmonary effort is normal. No respiratory distress.     Breath sounds: Normal breath sounds. No stridor. No wheezing, rhonchi or rales.  Chest:     Chest wall: No tenderness.  Abdominal:     General: Bowel sounds are normal.     Palpations: Abdomen is soft.  Musculoskeletal:        General: No swelling, tenderness, deformity or signs of injury. Normal range of motion.     Right lower leg: No edema.     Left lower leg: No edema.  Skin:    General: Skin is warm and dry.     Capillary Refill: Capillary refill takes less than 2 seconds.     Coloration: Skin is not jaundiced or pale.     Findings: No bruising, erythema, lesion or rash.   Neurological:     General: No focal deficit present.     Mental Status: She is alert and oriented to person, place, and time. Mental status is at baseline.     Cranial Nerves: No cranial nerve deficit.     Sensory: No sensory deficit.     Motor: No weakness.     Coordination: Coordination normal.  Psychiatric:        Mood and Affect: Mood normal.        Behavior: Behavior normal.        Thought Content: Thought content normal.        Judgment: Judgment normal.     No results found for any visits on 12/05/21.  Assessment & Plan     Problem List Items Addressed This Visit       Cardiovascular and Mediastinum   Renovascular hypertension - Primary    Refills for upcoming CPE      Relevant Medications   amLODipine (NORVASC) 5 MG tablet   losartan (COZAAR) 50 MG tablet     Other   Pain of right hand    Upon AM wakening- improves as the day goes on      Right hand paresthesia    Intermittent in nature; no pain on exam         Return for annual examination.      Vonna Kotyk, FNP, have reviewed all documentation for this visit. The documentation on 12/05/21 for the exam, diagnosis, procedures, and orders are all accurate and complete.    Gwyneth Sprout, [place] [phone] (phone) [phone] (fax)  [place]

## 2021-12-05 ENCOUNTER — Other Ambulatory Visit: Payer: Self-pay

## 2021-12-05 ENCOUNTER — Encounter: Payer: Self-pay | Admitting: Family Medicine

## 2021-12-05 ENCOUNTER — Ambulatory Visit (INDEPENDENT_AMBULATORY_CARE_PROVIDER_SITE_OTHER): Payer: BC Managed Care – PPO | Admitting: Family Medicine

## 2021-12-05 VITALS — BP 128/60 | HR 66 | Resp 16 | Wt 139.9 lb

## 2021-12-05 DIAGNOSIS — M79641 Pain in right hand: Secondary | ICD-10-CM | POA: Insufficient documentation

## 2021-12-05 DIAGNOSIS — I150 Renovascular hypertension: Secondary | ICD-10-CM | POA: Diagnosis not present

## 2021-12-05 DIAGNOSIS — R202 Paresthesia of skin: Secondary | ICD-10-CM

## 2021-12-05 MED ORDER — AMLODIPINE BESYLATE 5 MG PO TABS: 5.0000 mg | ORAL_TABLET | Freq: Every evening | ORAL | 3 refills | Status: DC

## 2021-12-05 MED ORDER — LOSARTAN POTASSIUM 50 MG PO TABS: 50.0000 mg | ORAL_TABLET | Freq: Every day | ORAL | 3 refills | Status: DC

## 2021-12-05 NOTE — Assessment & Plan Note (Signed)
Refills for upcoming CPE

## 2021-12-05 NOTE — Assessment & Plan Note (Signed)
Intermittent in nature; no pain on exam

## 2021-12-05 NOTE — Assessment & Plan Note (Signed)
Upon AM wakening- improves as the day goes on

## 2021-12-08 ENCOUNTER — Ambulatory Visit: Payer: BC Managed Care – PPO | Admitting: Dermatology

## 2021-12-10 ENCOUNTER — Encounter: Payer: Self-pay | Admitting: Dermatology

## 2022-01-01 ENCOUNTER — Ambulatory Visit: Payer: BC Managed Care – PPO | Admitting: Family Medicine

## 2022-01-06 ENCOUNTER — Ambulatory Visit (INDEPENDENT_AMBULATORY_CARE_PROVIDER_SITE_OTHER): Payer: BC Managed Care – PPO | Admitting: Vascular Surgery

## 2022-01-06 ENCOUNTER — Ambulatory Visit (INDEPENDENT_AMBULATORY_CARE_PROVIDER_SITE_OTHER): Payer: BC Managed Care – PPO

## 2022-01-06 ENCOUNTER — Other Ambulatory Visit: Payer: Self-pay

## 2022-01-06 ENCOUNTER — Encounter (INDEPENDENT_AMBULATORY_CARE_PROVIDER_SITE_OTHER): Payer: Self-pay | Admitting: Vascular Surgery

## 2022-01-06 VITALS — BP 119/75 | HR 65 | Resp 16 | Wt 139.0 lb

## 2022-01-06 DIAGNOSIS — I150 Renovascular hypertension: Secondary | ICD-10-CM

## 2022-01-06 DIAGNOSIS — H93A9 Pulsatile tinnitus, unspecified ear: Secondary | ICD-10-CM

## 2022-01-06 DIAGNOSIS — I722 Aneurysm of renal artery: Secondary | ICD-10-CM

## 2022-01-06 NOTE — Progress Notes (Signed)
MRN : [id number]  Natalie Dickerson is a 61 y.o. ([birth year]) female who presents with chief complaint of  Chief Complaint  Patient presents with   Follow-up    Ultrasound follow up  .  History of Present Illness: Patient returns today in follow up of her right renal artery aneurysm.  She is doing well.  Her blood pressure control is good.  She has been able to back off of her medicines over the years and her control is now excellent.  She had to get a new primary care provider and has not had a metabolic panel and will get one until her new visit in May, but plans to get one at that time.  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  Current Outpatient Medications  Medication Sig Dispense Refill   amLODipine (NORVASC) 5 MG tablet Take 1 tablet (5 mg total) by mouth every evening. 90 tablet 3   aspirin EC 81 MG EC tablet Take 1 tablet (81 mg total) by mouth daily. 90 tablet 3   cholecalciferol (VITAMIN D) 1000 units tablet Take 1,000 Units by mouth daily.     fluorouracil (EFUDEX) 5 % cream Apply topically 2 (two) times daily. Twice daily as directed 15 g 2   losartan (COZAAR) 50 MG tablet Take 1 tablet (50 mg total) by mouth daily. 90 tablet 3   mometasone (ELOCON) 0.1 % cream Twice daily for up to 5 days a week as needed for rash. Avoid applying to face, groin, and axilla. Use as directed. Long-term use can cause thinning of the skin. 45 g 1   Multiple Vitamins-Minerals (MULTIVITAMIN WITH MINERALS) tablet Take 1 tablet by mouth daily.     No current facility-administered medications for this visit.    Past Medical History:  Diagnosis Date   Cancer ([place])    skin ca   Hx of basal cell carcinoma 12/06/2013   Left sup forehead    Past Surgical History:  Procedure Laterality Date   EMBOLIZATION N/A 03/24/2017   Procedure: Embolization;   Surgeon: Algernon Huxley, MD;  Location: [facility];  Service: Cardiovascular;  Laterality: N/A;   FINGER SURGERY     PERIPHERAL VASCULAR CATHETERIZATION N/A 01/18/2017   Procedure: Renal Angiography;  Surgeon: Algernon Huxley, MD;  Location: [facility];  Service: Cardiovascular;  Laterality: N/A;   PERIPHERAL VASCULAR CATHETERIZATION  01/18/2017   Procedure: Renal Intervention;  Surgeon: Algernon Huxley, MD;  Location: [facility];  Service: Cardiovascular;;   RENAL ANGIOGRAPHY N/A 03/24/2017   Procedure: Renal Angiography;  Surgeon: Algernon Huxley, MD;  Location: [facility];  Service: Cardiovascular;  Laterality: N/A;    Family History  Problem Relation Age of Onset   Hypertension Mother     Breast cancer Mother     Cancer Mother     Breast cancer Father    No bleeding disorders or clotting disorders   Social History              Social History    Substance Use Topics     Smoking status: Former Smoker     Smokeless tobacco: Never Used          Comment: Smoked for about 5 years, smoked about 1 pack per day. Quit in 1984  Alcohol use Yes        Comment: Occasional alcohol use; Drinks 2-4 glasses of wine per week.    No IVDU   No Known Allergies         REVIEW OF SYSTEMS (Negative unless checked)   Constitutional: [] Weight loss  [] Fever  [] Chills Cardiac: [] Chest pain   [] Chest pressure   [] Palpitations   [] Shortness of breath when laying flat   [] Shortness of breath at rest   [] Shortness of breath with exertion. Vascular:  [] Pain in legs with walking   [] Pain in legs at rest   [] Pain in legs when laying flat   [] Claudication   [] Pain in feet when walking  [] Pain in feet at rest  [] Pain in feet when laying flat   [] History of DVT   [] Phlebitis   [] Swelling in legs   [] Varicose veins   [] Non-healing ulcers Pulmonary:   [] Uses home oxygen   [] Productive cough   [] Hemoptysis   [] Wheeze  [] COPD   [] Asthma Neurologic:  [] Dizziness  [] Blackouts   [] Seizures    [] History of stroke   [] History of TIA  [] Aphasia   [] Temporary blindness   [] Dysphagia   [] Weakness or numbness in arms   [] Weakness or numbness in legs Musculoskeletal:  [x] Arthritis   [] Joint swelling   [] Joint pain   [x] Low back pain Hematologic:  [] Easy bruising  [] Easy bleeding   [] Hypercoagulable state   [] Anemic  [] Hepatitis Gastrointestinal:  [] Blood in stool   [] Vomiting blood  [] Gastroesophageal reflux/heartburn   [] Abdominal pain Genitourinary:  [] Chronic kidney disease   [] Difficult urination  [] Frequent urination  [] Burning with urination   [] Hematuria Skin:  [] Rashes   [] Ulcers   [] Wounds Psychological:  [] History of anxiety   []  History of major depression.   Physical Examination  BP 119/75 (BP Location: Left Arm)    Pulse 65    Resp 16    Wt 139 lb (63 kg)    BMI 24.62 kg/m  Gen:  WD/WN, NAD. Appears younger than stated age. Head: [place]/AT, No temporalis wasting. Ear/Nose/Throat: Hearing grossly intact, nares w/o erythema or drainage Eyes: Conjunctiva clear. Sclera non-icteric Neck: Supple.  Trachea midline Pulmonary:  Good air movement, no use of accessory muscles.  Cardiac: RRR, no JVD Vascular:  Vessel Right Left  Radial Palpable Palpable                          PT Palpable Palpable  DP Palpable Palpable   Gastrointestinal: soft, non-tender/non-distended. No guarding/reflex.  Musculoskeletal: M/S 5/5 throughout.  No deformity or atrophy. No edema. Neurologic: Sensation grossly intact in extremities.  Symmetrical.  Speech is fluent.  Psychiatric: Judgment intact, Mood & affect appropriate for pt's clinical situation. Dermatologic: No rashes or ulcers noted.  No cellulitis or open wounds.      Labs No results found for this or any previous visit (from the past 2160 hour(s)).  Radiology No results found.  Assessment/Plan Pulsatile tinnitus Carotid duplex was normal.  Recheck as needed   Renovascular hypertension Better after treatment for her renal  artery aneurysm.  Has been backing off her medicine and will discuss this with her primary care physician.  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  Blood pressure control is much better.  Renal  function has been fine, but she will need a metabolic panel checked with her new patient visit in May.  I will continue to follow her annually with duplex.    Leotis Pain, MD  01/06/2022 9:25 AM    This note was created with Dragon medical transcription system.  Any errors from dictation are purely unintentional

## 2022-01-06 NOTE — Assessment & Plan Note (Signed)
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  Blood pressure control is much better.  Renal function has been fine, but she will need a metabolic panel checked with her new patient visit in May.  I will continue to follow her annually with duplex.

## 2022-01-19 ENCOUNTER — Ambulatory Visit: Payer: BC Managed Care – PPO | Admitting: Dermatology

## 2022-01-19 ENCOUNTER — Telehealth: Payer: Self-pay

## 2022-01-19 DIAGNOSIS — Z1231 Encounter for screening mammogram for malignant neoplasm of breast: Secondary | ICD-10-CM

## 2022-01-19 NOTE — Addendum Note (Signed)
Addended by: Minette Headland on: 01/19/2022 04:06 PM   Modules accepted: Orders

## 2022-01-19 NOTE — Telephone Encounter (Signed)
Copied from [place] [id number]. Topic: General - Other >> Jan 19, 2022  9:25 AM Natalie Dickerson A wrote: Reason for CRM: The patient has called to request orders for a regular mammogram   Please contact further if needed

## 2022-01-28 ENCOUNTER — Ambulatory Visit (INDEPENDENT_AMBULATORY_CARE_PROVIDER_SITE_OTHER): Payer: BC Managed Care – PPO | Admitting: Dermatology

## 2022-01-28 ENCOUNTER — Other Ambulatory Visit: Payer: Self-pay

## 2022-01-28 DIAGNOSIS — Z85828 Personal history of other malignant neoplasm of skin: Secondary | ICD-10-CM | POA: Diagnosis not present

## 2022-01-28 DIAGNOSIS — L814 Other melanin hyperpigmentation: Secondary | ICD-10-CM

## 2022-01-28 DIAGNOSIS — L570 Actinic keratosis: Secondary | ICD-10-CM

## 2022-01-28 DIAGNOSIS — Z1283 Encounter for screening for malignant neoplasm of skin: Secondary | ICD-10-CM

## 2022-01-28 DIAGNOSIS — L111 Transient acantholytic dermatosis [Grover]: Secondary | ICD-10-CM

## 2022-01-28 DIAGNOSIS — D1800 Hemangioma unspecified site: Secondary | ICD-10-CM

## 2022-01-28 DIAGNOSIS — L821 Other seborrheic keratosis: Secondary | ICD-10-CM

## 2022-01-28 DIAGNOSIS — D229 Melanocytic nevi, unspecified: Secondary | ICD-10-CM

## 2022-01-28 DIAGNOSIS — L578 Other skin changes due to chronic exposure to nonionizing radiation: Secondary | ICD-10-CM

## 2022-01-28 NOTE — Patient Instructions (Addendum)
If You Need Anything After Your Visit ° °If you have any questions or concerns for your doctor, please call our main line at [phone] and press option 4 to reach your doctor's medical assistant. If no one answers, please leave a voicemail as directed and we will return your call as soon as possible. Messages left after 4 pm will be answered the following business day.  ° °You may also send us a message via MyChart. We typically respond to MyChart messages within 1-2 business days. ° °For prescription refills, please ask your pharmacy to contact our office. Our fax number is [phone]. ° °If you have an urgent issue when the clinic is closed that cannot wait until the next business day, you can page your doctor at the number below.   ° °Please note that while we do our best to be available for urgent issues outside of office hours, we are not available 24/7.  ° °If you have an urgent issue and are unable to reach us, you may choose to seek medical care at your doctor's office, retail clinic, urgent care center, or emergency room. ° °If you have a medical emergency, please immediately call 911 or go to the emergency department. ° °Pager Numbers ° °- Dr. Kowalski: [phone] ° °- Dr. Moye: [phone] ° °- Dr. Stewart: [phone] ° °In the event of inclement weather, please call our main line at [phone] for an update on the status of any delays or closures. ° °Dermatology Medication Tips: °Please keep the boxes that topical medications come in in order to help keep track of the instructions about where and how to use these. Pharmacies typically print the medication instructions only on the boxes and not directly on the medication tubes.  ° °If your medication is too expensive, please contact our office at [phone] option 4 or send us a message through MyChart.  ° °We are unable to tell what your co-pay for medications will be in advance as this is different depending on your insurance coverage.  However, we may be able to find a substitute medication at lower cost or fill out paperwork to get insurance to cover a needed medication.  ° °If a prior authorization is required to get your medication covered by your insurance company, please allow us 1-2 business days to complete this process. ° °Drug prices often vary depending on where the prescription is filled and some pharmacies may offer cheaper prices. ° °The website www.goodrx.com contains coupons for medications through different pharmacies. The prices here do not account for what the cost may be with help from insurance (it may be cheaper with your insurance), but the website can give you the price if you did not use any insurance.  °- You can print the associated coupon and take it with your prescription to the pharmacy.  °- You may also stop by our office during regular business hours and pick up a GoodRx coupon card.  °- If you need your prescription sent electronically to a different pharmacy, notify our office through [facility] MyChart or by phone at [phone] option 4. ° ° ° ° °Si Usted Necesita Algo Después de Su Visita ° °También puede enviarnos un mensaje a través de MyChart. Por lo general respondemos a los mensajes de MyChart en el transcurso de 1 a 2 días hábiles. ° °Para renovar recetas, por favor pida a su farmacia que se ponga en contacto con nuestra oficina. Nuestro número de fax es el [phone]. ° °Si tiene   un asunto urgente cuando la clnica est cerrada y que no puede esperar hasta el siguiente da hbil, puede llamar/localizar a su doctor(a) al nmero que aparece a continuacin.   Por favor, tenga en cuenta que aunque hacemos todo lo posible para estar disponibles para asuntos urgentes fuera del horario de [place], no estamos disponibles las 24 horas del da, los 7 das de la [place].   Si tiene un problema urgente y no puede comunicarse con nosotros, puede optar por buscar atencin mdica  en el consultorio de su  doctor(a), en una clnica privada, en un centro de atencin urgente o en una sala de emergencias.  Si tiene Engineering geologist, por favor llame inmediatamente al 911 o vaya a la sala de emergencias.  Nmeros de bper  - Dr. Nehemiah Massed: [phone]  - Dra. Moye: [phone]  - Dra. Nicole Kindred: [phone]  En caso de inclemencias del [place], por favor llame a Johnsie Kindred principal al [phone] para una actualizacin sobre el [place] de cualquier retraso o cierre.  Consejos para la medicacin en dermatologa: Por favor, guarde las cajas en las que vienen los medicamentos de uso tpico para ayudarle a seguir las instrucciones sobre dnde y cmo usarlos. Las farmacias generalmente imprimen las instrucciones del medicamento slo en las cajas y no directamente en los tubos del [place].   Si su medicamento es muy caro, por favor, pngase en contacto con Zigmund Daniel llamando al [phone] y presione la opcin 4 o envenos un mensaje a travs de Pharmacist, community.   No podemos decirle cul ser su copago por los medicamentos por adelantado ya que esto es diferente dependiendo de la cobertura de su seguro. Sin embargo, es posible que podamos encontrar un medicamento sustituto a Electrical engineer un formulario para que el seguro cubra el medicamento que se considera necesario.   Si se requiere una autorizacin previa para que su compaa de seguros [place] su medicamento, por favor permtanos de 1 a 2 das hbiles para completar este proceso.  Los precios de los medicamentos varan con frecuencia dependiendo del Environmental consultant de dnde se surte la receta y alguna farmacias pueden ofrecer precios ms baratos.  El sitio web www.goodrx.com tiene cupones para medicamentos de Airline pilot. Los precios aqu no tienen en cuenta lo que podra costar con la ayuda del seguro (puede ser ms barato con su seguro), pero el sitio web puede darle el precio si no utiliz Research scientist (physical sciences).  - Puede imprimir el cupn  correspondiente y llevarlo con su receta a la farmacia.  - Tambin puede pasar por nuestra oficina durante el horario de atencin regular y Charity fundraiser una tarjeta de cupones de GoodRx.  - Si necesita que su receta se enve electrnicamente a Chiropodist, informe a nuestra oficina a travs de MyChart de [place] o por telfono llamando al [phone] y presione la opcin 4.   -Start 5-fluorouracil/calcipotriene cream twice a day for 7 days to affected areas including nose, cheeks, forehead (patient treating forehead again) and chest. Patient has medication at home.   5-fluorouracil/calcipotriene cream is is a type of field treatment used to treat precancers, thin skin cancers, and areas of sun damage. Expected reaction includes irritation and mild inflammation potentially progressing to more severe inflammation including redness, scaling, crusting and open sores/erosions.  If too much irritation occurs, ensure application of only a thin layer and decrease frequency of use to achieve a tolerable level of inflammation. Recommend applying Vaseline ointment to open sores as needed.  Minimize sun  exposure while under treatment. Recommend daily broad spectrum sunscreen SPF 30+ to sun-exposed areas, reapply every 2 hours as needed.

## 2022-01-28 NOTE — Progress Notes (Signed)
Follow-Up Visit   Subjective  Natalie Dickerson is a 61 y.o. female who presents for the following: Total body skin exam (Hx of BCC L sup forehead), Actinic Keratosis (Forehead, used 5FU/Caclipotriene cr bid x 7 days finished ~01/12/22), and Intertrigo (Inframammary, mometasone cream made it worse so now uses otc HC cream prn flares). The patient presents for Total-Body Skin Exam (TBSE) for skin cancer screening and mole check.  The patient has spots, moles and lesions to be evaluated, some may be new or changing and the patient has concerns that these could be cancer.  The following portions of the chart were reviewed this encounter and updated as appropriate:   Tobacco   Allergies   Meds   Problems   Med Hx   Surg Hx   Fam Hx      Review of Systems:  No other skin or systemic complaints except as noted in HPI or Assessment and Plan.  Objective  Well appearing patient in no apparent distress; mood and affect are within normal limits.  A full examination was performed including scalp, head, eyes, ears, nose, lips, neck, chest, axillae, abdomen, back, buttocks, bilateral upper extremities, bilateral lower extremities, hands, feet, fingers, toes, fingernails, and toenails. All findings within normal limits unless otherwise noted below.  L sup forehead Well healed scar with no evidence of recurrence.   cheeks, forehead Pink scaly macules cheeks, forehead, chest  chest, abdomen, back Small patches of erythema and scale chest, abdomen, back   Assessment & Plan   Lentigines - Scattered tan macules - Due to sun exposure - Benign-appearing, observe - Recommend daily broad spectrum sunscreen SPF 30+ to sun-exposed areas, reapply every 2 hours as needed. - Call for any changes  Seborrheic Keratoses - Stuck-on, waxy, tan-brown papules and/or plaques  - Benign-appearing - Discussed benign etiology and prognosis. - Observe - Call for any changes  Melanocytic Nevi - Tan-brown and/or  pink-flesh-colored symmetric macules and papules - Benign appearing on exam today - Observation - Call clinic for new or changing moles - Recommend daily use of broad spectrum spf 30+ sunscreen to sun-exposed areas.   Hemangiomas - Red papules - Discussed benign nature - Observe - Call for any changes  Actinic Damage - Severe, confluent actinic changes with pre-cancerous actinic keratoses  - Severe, chronic, not at goal, secondary to cumulative UV radiation exposure over time - diffuse scaly erythematous macules and papules with underlying dyspigmentation - Discussed Prescription "Field Treatment" for Severe, Chronic Confluent Actinic Changes with Pre-Cancerous Actinic Keratoses Field treatment involves treatment of an entire area of skin that has confluent Actinic Changes (Sun/ Ultraviolet light damage) and PreCancerous Actinic Keratoses by method of PhotoDynamic Therapy (PDT) and/or prescription Topical Chemotherapy agents such as 5-fluorouracil, 5-fluorouracil/calcipotriene, and/or imiquimod.  The purpose is to decrease the number of clinically evident and subclinical PreCancerous lesions to prevent progression to development of skin cancer by chemically destroying early precancer changes that may or may not be visible.  It has been shown to reduce the risk of developing skin cancer in the treated area. As a result of treatment, redness, scaling, crusting, and open sores may occur during treatment course. One or more than one of these methods may be used and may have to be used several times to control, suppress and eliminate the PreCancerous changes. Discussed treatment course, expected reaction, and possible side effects. - Recommend daily broad spectrum sunscreen SPF 30+ to sun-exposed areas, reapply every 2 hours as needed.  - Staying in  the shade or wearing long sleeves, sun glasses (UVA+UVB protection) and wide brim hats (4-inch brim around the entire circumference of the hat) are also  recommended. - Call for new or changing lesions.  -Start 5-fluorouracil/calcipotriene cream twice a day for 7 days to affected areas including nose, cheeks, forehead (patient treating forehead again) and chest. Patient has medication at home. Patient provided with contact information for pharmacy and advised the pharmacy will mail the prescription to their home. Patient provided with handout reviewing treatment course and side effects and advised to call or message [place] on MyChart with any concerns.   Skin cancer screening performed today.  History of basal cell carcinoma (BCC) L sup forehead Clear. Observe for recurrence. Call clinic for new or changing lesions.  Recommend regular skin exams, daily broad-spectrum spf 30+ sunscreen use, and photoprotection.    AK (actinic keratosis) cheeks, forehead Pt will start 5FU/Calcipotriene cr bid for 7 days to forehead, cheeks, nose, chest Related Medications fluorouracil (EFUDEX) 5 % cream Apply topically 2 (two) times daily. Twice daily as directed  Grover's disease chest, abdomen, back Chronic and persistent.  Not improving. Cont otc HC cream qd prn flares  Discussed Opzelura cream, Clindamycin lotion, pt can call if she wants [place] to send in.  Skin cancer screening  Return in about 9 months (around 10/28/2022) for Hx of AKs face, chest.  I, Sonya Hupman, RMA, am acting as scribe for Sarina Ser, MD . Documentation: I have reviewed the above documentation for accuracy and completeness, and I agree with the above.  Sarina Ser, MD

## 2022-01-31 ENCOUNTER — Encounter: Payer: Self-pay | Admitting: Dermatology

## 2022-03-10 ENCOUNTER — Other Ambulatory Visit: Payer: Self-pay

## 2022-03-10 ENCOUNTER — Ambulatory Visit
Admission: RE | Admit: 2022-03-10 | Discharge: 2022-03-10 | Disposition: A | Discharge status: Home / Self Care | Payer: BC Managed Care – PPO | Source: Ambulatory Visit | Attending: Family Medicine | Admitting: Family Medicine

## 2022-03-10 DIAGNOSIS — Z1231 Encounter for screening mammogram for malignant neoplasm of breast: Secondary | ICD-10-CM | POA: Insufficient documentation

## 2022-03-18 ENCOUNTER — Encounter: Payer: Self-pay | Admitting: Dermatology

## 2022-04-29 ENCOUNTER — Ambulatory Visit: Payer: BC Managed Care – PPO | Admitting: Dermatology

## 2022-05-01 ENCOUNTER — Encounter: Payer: BC Managed Care – PPO | Admitting: Family Medicine

## 2022-05-12 ENCOUNTER — Encounter: Payer: Self-pay | Admitting: Family Medicine

## 2022-05-12 ENCOUNTER — Ambulatory Visit (INDEPENDENT_AMBULATORY_CARE_PROVIDER_SITE_OTHER): Payer: BC Managed Care – PPO | Admitting: Family Medicine

## 2022-05-12 ENCOUNTER — Other Ambulatory Visit (HOSPITAL_COMMUNITY)
Admission: RE | Admit: 2022-05-12 | Discharge: 2022-05-12 | Disposition: A | Discharge status: Home / Self Care | Payer: BC Managed Care – PPO | Source: Ambulatory Visit | Attending: Family Medicine | Admitting: Family Medicine

## 2022-05-12 VITALS — BP 96/78 | HR 72 | Temp 98.5°F | Resp 16 | Ht 63.0 in | Wt 137.2 lb

## 2022-05-12 DIAGNOSIS — Z0000 Encounter for general adult medical examination without abnormal findings: Secondary | ICD-10-CM

## 2022-05-12 DIAGNOSIS — Z1211 Encounter for screening for malignant neoplasm of colon: Secondary | ICD-10-CM | POA: Diagnosis not present

## 2022-05-12 DIAGNOSIS — Z124 Encounter for screening for malignant neoplasm of cervix: Secondary | ICD-10-CM | POA: Insufficient documentation

## 2022-05-12 DIAGNOSIS — E559 Vitamin D deficiency, unspecified: Secondary | ICD-10-CM | POA: Diagnosis not present

## 2022-05-12 DIAGNOSIS — R7303 Prediabetes: Secondary | ICD-10-CM | POA: Insufficient documentation

## 2022-05-12 DIAGNOSIS — E538 Deficiency of other specified B group vitamins: Secondary | ICD-10-CM

## 2022-05-12 DIAGNOSIS — I150 Renovascular hypertension: Secondary | ICD-10-CM

## 2022-05-12 MED ORDER — LOSARTAN POTASSIUM 25 MG PO TABS: 25.0000 mg | ORAL_TABLET | Freq: Every day | ORAL | 1 refills | Status: DC

## 2022-05-12 NOTE — Assessment & Plan Note (Signed)
BP is low ?Already stopped amlodipine  ?Will decrease losartan to '25mg'$  daily and consider d/c'ing if continues to be low ?Recheck metabolic panel ?F/u in 6 months  ?

## 2022-05-12 NOTE — Progress Notes (Signed)
? ? ?I,Sulibeya S Dimas,acting as a scribe for Lavon Paganini, MD.,have documented all relevant documentation on the behalf of Lavon Paganini, MD,as directed by  Lavon Paganini, MD while in the presence of Lavon Paganini, MD. ? ? ?Complete physical exam ? ? ?[name]   DOB: 1961/01/07   61 y.o. Female  MRN: [id number] ?Visit Date: 05/12/2022 ? ?Today's healthcare provider: Lavon Paganini, MD  ? ?Chief Complaint  ?Patient presents with  ? Annual Exam  ? ?Subjective  ?  ?Natalie Dickerson is a 61 y.o. female who presents today for a complete physical exam.  ?She reports consuming a general diet. The patient does not participate in regular exercise at present. She generally feels well. She reports sleeping well. She does not have additional problems to discuss today.  ?HPI  ? ?BP has been running low. Has been off of amlodipine x2 months.  Started back on losartan after having high readings.   ? ?Past Medical History:  ?Diagnosis Date  ? Actinic keratosis 05/01/2014  ? Left superior forehead  ? Cancer [facility])   ? skin ca  ? Hx of basal cell carcinoma 12/06/2013  ? Left sup forehead  ? ?Past Surgical History:  ?Procedure Laterality Date  ? EMBOLIZATION N/A 03/24/2017  ? Procedure: Embolization;  Surgeon: Algernon Huxley, MD;  Location: [place] [facility];  Service: Cardiovascular;  Laterality: N/A;  ? FINGER SURGERY    ? PERIPHERAL VASCULAR CATHETERIZATION N/A 01/18/2017  ? Procedure: Renal Angiography;  Surgeon: Algernon Huxley, MD;  Location: [place] [facility];  Service: Cardiovascular;  Laterality: N/A;  ? PERIPHERAL VASCULAR CATHETERIZATION  01/18/2017  ? Procedure: Renal Intervention;  Surgeon: Algernon Huxley, MD;  Location: [place] [facility];  Service: Cardiovascular;;  ? RENAL ANGIOGRAPHY N/A 03/24/2017  ? Procedure: Renal Angiography;  Surgeon: Algernon Huxley, MD;  Location: [place] [facility];  Service: Cardiovascular;  Laterality: N/A;  ? ?Social History  ? ?Socioeconomic History  ? Marital  status: Married  ?  Spouse name: Not on file  ? Number of children: 2  ? Years of education: Not on file  ? Highest education level: Not on file  ?Occupational History  ? Not on file  ?Tobacco Use  ? Smoking status: Former  ?  Years: 5.00  ?  Types: Cigarettes  ? Smokeless tobacco: Never  ? Tobacco comments:  ?  Smoked for about 5 years, smoked about 1 pack per day. Quit in 1984  ?Vaping Use  ? Vaping Use: Never used  ?Substance and Sexual Activity  ? Alcohol use: Yes  ?  Comment: Occasional alcohol use; Drinks 2-4 glasses of wine per week.  ? Drug use: No  ? Sexual activity: Not on file  ?Other Topics Concern  ? Not on file  ?Social History Narrative  ? Not on file  ? ?Social Determinants of Health  ? ?Financial Resource Strain: Not on file  ?Food Insecurity: Not on file  ?Transportation Needs: Not on file  ?Physical Activity: Not on file  ?Stress: Not on file  ?Social Connections: Not on file  ?Intimate Partner Violence: Not on file  ? ?Family Status  ?Relation Name Status  ? Mother  Alive  ? Father  Deceased at age 58  ?     died from breast cancer   ? MGM  Deceased  ? MGF  Deceased  ? PGM  Deceased  ? PGF  Deceased  ? Sister  Alive  ?  Brother  Alive  ? Brother  Alive  ? Son  Alive  ? Son  Alive  ? ?Family History  ?Problem Relation Age of Onset  ? Hypertension Mother   ? Breast cancer Mother 73  ? Cancer Mother   ?     breast cancer  ? Breast cancer Father 58  ? Hypertension Brother   ? ?Allergies  ?Allergen Reactions  ? Bee Venom Anaphylaxis  ? Ivp Dye [Iodinated Contrast Media] Rash  ?  ?Patient Care Team: ?Virginia Crews, MD as PCP - General (Family Medicine)  ? ?Medications: ?Outpatient Medications Prior to Visit  ?Medication Sig  ? aspirin EC 81 MG EC tablet Take 1 tablet (81 mg total) by mouth daily.  ? cholecalciferol (VITAMIN D) 1000 units tablet Take 1,000 Units by mouth daily.  ? fluorouracil (EFUDEX) 5 % cream Apply topically 2 (two) times daily. Twice daily as directed  ? Multiple  Vitamins-Minerals (MULTIVITAMIN WITH MINERALS) tablet Take 1 tablet by mouth daily.  ? [DISCONTINUED] amLODipine (NORVASC) 5 MG tablet Take 1 tablet (5 mg total) by mouth every evening.  ? [DISCONTINUED] losartan (COZAAR) 50 MG tablet Take 1 tablet (50 mg total) by mouth daily.  ? [DISCONTINUED] mometasone (ELOCON) 0.1 % cream Twice daily for up to 5 days a week as needed for rash. Avoid applying to face, groin, and axilla. Use as directed. Long-term use can cause thinning of the skin. (Patient not taking: Reported on 05/12/2022)  ? ?No facility-administered medications prior to visit.  ? ? ?Review of Systems  ?HENT:  Positive for tinnitus.   ?Musculoskeletal:  Positive for neck pain.  ?All other systems reviewed and are negative. ? ?Last CBC ?Lab Results  ?Component Value Date  ? WBC 6.0 12/26/2020  ? HGB 14.4 12/26/2020  ? HCT 42.8 12/26/2020  ? MCV 90 12/26/2020  ? MCH 30.4 12/26/2020  ? RDW 11.7 12/26/2020  ? PLT 342 12/26/2020  ? ?Last metabolic panel ?Lab Results  ?Component Value Date  ? GLUCOSE 86 12/26/2020  ? NA 143 12/26/2020  ? K 5.0 12/26/2020  ? CL 105 12/26/2020  ? CO2 22 12/26/2020  ? BUN 16 12/26/2020  ? CREATININE 0.88 12/26/2020  ? GFRNONAA 73 12/26/2020  ? CALCIUM 9.7 12/26/2020  ? PROT 7.5 12/26/2020  ? ALBUMIN 4.7 12/26/2020  ? LABGLOB 2.8 12/26/2020  ? AGRATIO 1.7 12/26/2020  ? BILITOT 0.4 12/26/2020  ? ALKPHOS 91 12/26/2020  ? AST 25 12/26/2020  ? ALT 22 12/26/2020  ? ANIONGAP 7 03/24/2017  ? ?Last lipids ?Lab Results  ?Component Value Date  ? CHOL 234 (H) 12/26/2020  ? HDL 95 12/26/2020  ? LDLCALC 127 (H) 12/26/2020  ? TRIG 68 12/26/2020  ? CHOLHDL 2.5 12/26/2020  ? ?Last hemoglobin A1c ?Lab Results  ?Component Value Date  ? HGBA1C 5.7 (H) 12/26/2020  ? ?Last thyroid functions ?Lab Results  ?Component Value Date  ? TSH 1.190 12/26/2020  ? ?Last vitamin D ?Lab Results  ?Component Value Date  ? VD25OH 52.2 12/26/2020  ? ?Last vitamin B12 and Folate ?Lab Results  ?Component Value Date  ?  [id number] 931 12/26/2020  ? ?  ? Objective  ? ?  ?BP 96/78 (BP Location: Left Arm, Patient Position: Sitting, Cuff Size: Large)   Pulse 72   Temp 98.5 ?F (36.9 ?C) (Oral)   Resp 16   Ht '5\' 3"'$  (1.6 m)   Wt 137 lb 3.2 oz (62.2 kg)   BMI 24.30 kg/m?  ?BP Readings  from Last 3 Encounters:  ?05/12/22 96/78  ?01/06/22 119/75  ?12/05/21 128/60  ? ?Wt Readings from Last 3 Encounters:  ?05/12/22 137 lb 3.2 oz (62.2 kg)  ?01/06/22 139 lb (63 kg)  ?12/05/21 139 lb 14.4 oz (63.5 kg)  ? ?  ? ? ?Physical Exam ?Vitals reviewed.  ?Constitutional:   ?   General: She is not in acute distress. ?   Appearance: Normal appearance. She is well-developed. She is not diaphoretic.  ?HENT:  ?   Head: Normocephalic and atraumatic.  ?   Right Ear: Tympanic membrane, ear canal and external ear normal.  ?   Left Ear: Tympanic membrane, ear canal and external ear normal.  ?   Nose: Nose normal.  ?   Mouth/Throat:  ?   Mouth: Mucous membranes are moist.  ?   Pharynx: Oropharynx is clear. No oropharyngeal exudate.  ?Eyes:  ?   General: No scleral icterus. ?   Conjunctiva/sclera: Conjunctivae normal.  ?   Pupils: Pupils are equal, round, and reactive to light.  ?Neck:  ?   Thyroid: No thyromegaly.  ?Cardiovascular:  ?   Rate and Rhythm: Normal rate and regular rhythm.  ?   Pulses: Normal pulses.  ?   Heart sounds: Normal heart sounds. No murmur heard. ?Pulmonary:  ?   Effort: Pulmonary effort is normal. No respiratory distress.  ?   Breath sounds: Normal breath sounds. No wheezing or rales.  ?Chest:  ?   Comments: Breasts: breasts appear normal, no suspicious masses, no skin or nipple changes or axillary nodes  ?Abdominal:  ?   General: There is no distension.  ?   Palpations: Abdomen is soft.  ?   Tenderness: There is no abdominal tenderness.  ?Genitourinary: ?   Comments: GYN:  External genitalia within normal limits.  Vaginal mucosa pink, moist, normal rugae.  Nonfriable cervix without lesions, no discharge or bleeding noted on speculum  exam.  ?Musculoskeletal:     ?   General: No deformity.  ?   Cervical back: Neck supple.  ?   Right lower leg: No edema.  ?   Left lower leg: No edema.  ?   Comments: Tightness of R trazepius muscle. Normal neck ROM  ?Lymphadenopathy:

## 2022-05-12 NOTE — Assessment & Plan Note (Signed)
Recommend low carb diet °Recheck A1c  °

## 2022-05-12 NOTE — Patient Instructions (Addendum)
[name] PA if needed ?

## 2022-05-13 ENCOUNTER — Other Ambulatory Visit: Payer: Self-pay

## 2022-05-13 ENCOUNTER — Telehealth: Payer: Self-pay

## 2022-05-13 DIAGNOSIS — Z86010 Personal history of colonic polyps: Secondary | ICD-10-CM

## 2022-05-13 LAB — LIPID PANEL WITH LDL/HDL RATIO
Cholesterol, Total: 192 mg/dL (ref 100–199)
HDL: 81 mg/dL (ref >39)
LDL Chol Calc (NIH): 94 mg/dL (ref 0–99)
LDL/HDL Ratio: 1.2 ratio (ref 0.0–3.2)
Triglycerides: 95 mg/dL (ref 0–149)
VLDL Cholesterol Cal: 17 mg/dL (ref 5–40)

## 2022-05-13 LAB — HEMOGLOBIN A1C
Est. average glucose Bld gHb Est-mCnc: 114 mg/dL
Hgb A1c MFr Bld: 5.6 % (ref 4.8–5.6)

## 2022-05-13 LAB — COMPREHENSIVE METABOLIC PANEL
ALT: 18 IU/L (ref 0–32)
AST: 22 IU/L (ref 0–40)
Albumin/Globulin Ratio: 1.7 (ref 1.2–2.2)
Albumin: 4.6 g/dL (ref 3.8–4.9)
Alkaline Phosphatase: 89 IU/L (ref 44–121)
BUN/Creatinine Ratio: 18 (ref 9–23)
BUN: 18 mg/dL (ref 6–24)
Bilirubin Total: 0.5 mg/dL (ref 0.0–1.2)
CO2: 23 mmol/L (ref 20–29)
Calcium: 9.8 mg/dL (ref 8.7–10.2)
Chloride: 104 mmol/L (ref 96–106)
Creatinine, Ser: 0.99 mg/dL (ref 0.57–1.00)
Globulin, Total: 2.7 g/dL (ref 1.5–4.5)
Glucose: 83 mg/dL (ref 70–99)
Potassium: 4.8 mmol/L (ref 3.5–5.2)
Sodium: 142 mmol/L (ref 134–144)
Total Protein: 7.3 g/dL (ref 6.0–8.5)
eGFR: 66 mL/min/{1.73_m2} (ref >59)

## 2022-05-13 LAB — VITAMIN B12: Vitamin B-12: 748 pg/mL (ref 232–1245)

## 2022-05-13 LAB — VITAMIN D 25 HYDROXY (VIT D DEFICIENCY, FRACTURES): Vit D, 25-Hydroxy: 56.4 ng/mL (ref 30.0–100.0)

## 2022-05-13 MED ORDER — NA SULFATE-K SULFATE-MG SULF 17.5-3.13-1.6 GM/177ML PO SOLN: 1.0000 | Freq: Once | ORAL | 0 refills | Status: AC

## 2022-05-13 NOTE — Progress Notes (Signed)
Gastroenterology Pre-Procedure Review ? ?Request Date: 08/14/2022 ?Requesting Physician: Dr. Allen Norris ? ?PATIENT REVIEW QUESTIONS: The patient responded to the following health history questions as indicated:   ? ?1. Are you having any GI issues? no ?2. Do you have a personal history of Polyps? yes (LAST COLONOSCOPY ) ?3. Do you have a family history of Colon Cancer or Polyps? no ?4. Diabetes Mellitus? no ?5. Joint replacements in the past 12 months?no ?6. Major health problems in the past 3 months?no ?7. Any artificial heart valves, MVP, or defibrillator?no ?   ?MEDICATIONS & ALLERGIES:    ?Patient reports the following regarding taking any anticoagulation/antiplatelet therapy:   ?Plavix, Coumadin, Eliquis, Xarelto, Lovenox, Pradaxa, Brilinta, or Effient? no ?Aspirin? yes (81 MG) ? ?Patient confirms/reports the following medications:  ?Current Outpatient Medications  ?Medication Sig Dispense Refill  ? aspirin EC 81 MG EC tablet Take 1 tablet (81 mg total) by mouth daily. 90 tablet 3  ? cholecalciferol (VITAMIN D) 1000 units tablet Take 1,000 Units by mouth daily.    ? fluorouracil (EFUDEX) 5 % cream Apply topically 2 (two) times daily. Twice daily as directed 15 g 2  ? losartan (COZAAR) 25 MG tablet Take 1 tablet (25 mg total) by mouth daily. 90 tablet 1  ? Multiple Vitamins-Minerals (MULTIVITAMIN WITH MINERALS) tablet Take 1 tablet by mouth daily.    ? ?No current facility-administered medications for this visit.  ? ? ?Patient confirms/reports the following allergies:  ?Allergies  ?Allergen Reactions  ? Bee Venom Anaphylaxis  ? Ivp Dye [Iodinated Contrast Media] Rash  ? ? ?No orders of the defined types were placed in this encounter. ? ? ?AUTHORIZATION INFORMATION ?Primary Insurance: ?1D#: ?Group #: ? ?Secondary Insurance: ?1D#: ?Group #: ? ?SCHEDULE INFORMATION: ?Date: 08/14/2022 ?Time: ?Location: ?[place] ? ?

## 2022-05-13 NOTE — Telephone Encounter (Signed)
CALLED PATIENT NO ANSWER LEFT VOICEMAIL FOR A CALL BACK ? ?

## 2022-05-15 LAB — CYTOLOGY - PAP
Comment: NEGATIVE
Diagnosis: NEGATIVE
High risk HPV: NEGATIVE

## 2022-07-08 ENCOUNTER — Telehealth: Payer: Self-pay

## 2022-07-08 NOTE — Telephone Encounter (Signed)
Call has been returned.  Colonoscopy has been rescheduled to 07/28/22 with Dr. Allen Norris.  Natalie Dickerson will be notified tomorrow of new procedure date.  Thanks, [place], [place]

## 2022-07-08 NOTE — Telephone Encounter (Signed)
Patient is wanting to reschedule her colonoscopy

## 2022-07-10 ENCOUNTER — Telehealth: Payer: Self-pay

## 2022-07-10 NOTE — Telephone Encounter (Signed)
Patient was called back to ask if we could reschedule to 08/07/22 because Dr. Allen Norris is at [facility] and that date is full.  She agreed to move to 08/07/22 still at [facility].  Thanks, [place], [place]

## 2022-08-06 ENCOUNTER — Encounter: Payer: Self-pay | Admitting: Gastroenterology

## 2022-08-07 ENCOUNTER — Ambulatory Visit
Admission: RE | Admit: 2022-08-07 | Discharge: 2022-08-07 | Disposition: A | Discharge status: Home / Self Care | Payer: BC Managed Care – PPO | Attending: Gastroenterology | Admitting: Gastroenterology

## 2022-08-07 ENCOUNTER — Encounter: Payer: Self-pay | Admitting: Gastroenterology

## 2022-08-07 ENCOUNTER — Encounter: Payer: Self-pay | Admitting: General Practice

## 2022-08-07 ENCOUNTER — Encounter
Admission: RE | Disposition: A | Discharge status: Home / Self Care | Payer: Self-pay | Source: Home / Self Care | Attending: Gastroenterology

## 2022-08-07 ENCOUNTER — Ambulatory Visit: Payer: Self-pay | Admitting: General Practice

## 2022-08-07 DIAGNOSIS — K621 Rectal polyp: Secondary | ICD-10-CM

## 2022-08-07 DIAGNOSIS — K635 Polyp of colon: Secondary | ICD-10-CM | POA: Diagnosis not present

## 2022-08-07 DIAGNOSIS — Z86010 Personal history of colonic polyps: Secondary | ICD-10-CM

## 2022-08-07 DIAGNOSIS — D128 Benign neoplasm of rectum: Secondary | ICD-10-CM | POA: Insufficient documentation

## 2022-08-07 DIAGNOSIS — K640 First degree hemorrhoids: Secondary | ICD-10-CM | POA: Insufficient documentation

## 2022-08-07 DIAGNOSIS — Z1211 Encounter for screening for malignant neoplasm of colon: Secondary | ICD-10-CM | POA: Insufficient documentation

## 2022-08-07 HISTORY — PX: COLONOSCOPY WITH PROPOFOL: SHX5780

## 2022-08-07 SURGERY — COLONOSCOPY WITH PROPOFOL
Anesthesia: General

## 2022-08-07 MED ORDER — PROPOFOL 10 MG/ML IV BOLUS
INTRAVENOUS | Status: DC | PRN
  Administered 2022-08-07: 70 mg via INTRAVENOUS

## 2022-08-07 MED ORDER — SODIUM CHLORIDE 0.9 % IV SOLN
INTRAVENOUS | Status: DC
  Administered 2022-08-07: 20 mL/h via INTRAVENOUS

## 2022-08-07 MED ORDER — PROPOFOL 500 MG/50ML IV EMUL
INTRAVENOUS | Status: DC | PRN
  Administered 2022-08-07: 140 ug/kg/min via INTRAVENOUS

## 2022-08-07 MED ORDER — PROPOFOL 1000 MG/100ML IV EMUL
INTRAVENOUS | Status: AC
  Filled 2022-08-07: qty 200

## 2022-08-07 NOTE — Transfer of Care (Signed)
Immediate Anesthesia Transfer of Care Note  [name]  Procedure(s) Performed: COLONOSCOPY WITH PROPOFOL  Patient Location: PACU  Anesthesia Type:General  Level of Consciousness: awake, alert  and oriented  Airway & Oxygen Therapy: Patient Spontanous Breathing  Post-op Assessment: Report given to RN and Post -op Vital signs reviewed and stable  Post vital signs: Reviewed and stable  Last Vitals:  Vitals Value Taken Time  BP 90/60 08/07/22 1023  Temp    Pulse 77 08/07/22 1023  Resp 22 08/07/22 1023  SpO2 96 % 08/07/22 1023  Vitals shown include unvalidated device data.  Last Pain:  Vitals:   08/07/22 0908  TempSrc: Temporal  PainSc: 0-No pain         Complications: No notable events documented.

## 2022-08-07 NOTE — Op Note (Signed)
[facility] Gastroenterology Patient Name: Natalie Dickerson Procedure Date: 08/07/2022 10:02 AM MRN: [id number] Account #: [id number] Date of Birth: 01-26-1961 Admit Type: Outpatient Age: 61 Room: [facility] ENDO ROOM 4 Gender: Female Note Status: Finalized Instrument Name: Jasper Riling [id number] Procedure:             Colonoscopy Indications:           High risk colon cancer surveillance: Personal history                         of colonic polyps Providers:             Lucilla Lame MD, MD Referring MD:          [name]. Bacigalupo (Referring MD) Medicines:             Propofol per Anesthesia Complications:         No immediate complications. Procedure:             Pre-Anesthesia Assessment:                        - Prior to the procedure, a History and Physical was                         performed, and patient medications and allergies were                         reviewed. The patient's tolerance of previous                         anesthesia was also reviewed. The risks and benefits                         of the procedure and the sedation options and risks                         were discussed with the patient. All questions were                         answered, and informed consent was obtained. Prior                         Anticoagulants: The patient has taken no previous                         anticoagulant or antiplatelet agents. ASA Grade                         Assessment: II - A patient with mild systemic disease.                         After reviewing the risks and benefits, the patient                         was deemed in satisfactory condition to undergo the                         procedure.  After obtaining informed consent, the colonoscope was                         passed under direct vision. Throughout the procedure,                         the patient's blood pressure, pulse, and oxygen                         saturations were  monitored continuously. The                         Colonoscope was introduced through the anus and                         advanced to the the cecum, identified by appendiceal                         orifice and ileocecal valve. The colonoscopy was                         performed without difficulty. The patient tolerated                         the procedure well. The quality of the bowel                         preparation was excellent. Findings:      The perianal and digital rectal examinations were normal.      A 5 mm polyp was found in the rectum. The polyp was sessile. The polyp       was removed with a cold snare. Resection and retrieval were complete.      Non-bleeding internal hemorrhoids were found during retroflexion. The       hemorrhoids were Grade I (internal hemorrhoids that do not prolapse). Impression:            - One 5 mm polyp in the rectum, removed with a cold                         snare. Resected and retrieved.                        - Non-bleeding internal hemorrhoids. Recommendation:        - Discharge patient to home.                        - Resume previous diet.                        - Continue present medications.                        - Await pathology results.                        - If the pathology report reveals adenomatous tissue,                         then repeat the colonoscopy for surveillance in 7  years. Procedure Code(s):     --- Professional ---                        [phone], Colonoscopy, flexible; with removal of                         tumor(s), polyp(s), or other lesion(s) by snare                         technique Diagnosis Code(s):     --- Professional ---                        Z86.010, Personal history of colonic polyps                        K62.1, Rectal polyp CPT copyright 2019 American Medical Association. All rights reserved. The codes documented in this report are preliminary and upon coder review may   be revised to meet current compliance requirements. Lucilla Lame MD, MD 08/07/2022 10:19:05 AM This report has been signed electronically. Number of Addenda: 0 Note Initiated On: 08/07/2022 10:02 AM Scope Withdrawal Time: 0 hours 7 minutes 12 seconds  Total Procedure Duration: 0 hours 10 minutes 53 seconds  Estimated Blood Loss:  Estimated blood loss: none.      [facility]

## 2022-08-07 NOTE — Anesthesia Preprocedure Evaluation (Signed)
Anesthesia Evaluation  Patient identified by MRN, date of birth, ID band Patient awake    Reviewed: Allergy & Precautions, NPO status , Patient's Chart, lab work & pertinent test results  History of Anesthesia Complications (+) PONV and history of anesthetic complications  Airway Mallampati: II  TM Distance: >3 FB Neck ROM: full    Dental  (+) Teeth Intact   Pulmonary neg pulmonary ROS, neg shortness of breath, neg COPD, former smoker,    Pulmonary exam normal        Cardiovascular hypertension, (-) angina(-) Past MI and (-) CABG Normal cardiovascular exam(-) pacemaker     Neuro/Psych negative neurological ROS  negative psych ROS   GI/Hepatic negative GI ROS, Neg liver ROS,   Endo/Other  negative endocrine ROS  Renal/GU negative Renal ROS  negative genitourinary   Musculoskeletal   Abdominal   Peds  Hematology negative hematology ROS (+)   Anesthesia Other Findings Past Medical History: 05/01/2014: Actinic keratosis     Comment:  Left superior forehead No date: Cancer ([place])     Comment:  skin ca 12/06/2013: Hx of basal cell carcinoma     Comment:  Left sup forehead  Past Surgical History: 03/24/2017: EMBOLIZATION; N/A     Comment:  Procedure: Embolization;  Surgeon: Algernon Huxley, MD;                Location: [place] [facility];  Service: Cardiovascular;              Laterality: N/A; No date: FINGER SURGERY 01/18/2017: PERIPHERAL VASCULAR CATHETERIZATION; N/A     Comment:  Procedure: Renal Angiography;  Surgeon: Algernon Huxley, MD;              Location: [place] [facility];  Service: Cardiovascular;              Laterality: N/A; 01/18/2017: PERIPHERAL VASCULAR CATHETERIZATION     Comment:  Procedure: Renal Intervention;  Surgeon: Algernon Huxley,               MD;  Location: [place] [facility];  Service:               Cardiovascular;; 03/24/2017: RENAL ANGIOGRAPHY; N/A     Comment:  Procedure: Renal  Angiography;  Surgeon: Algernon Huxley, MD;              Location: [place] [facility];  Service: Cardiovascular;              Laterality: N/A;  BMI    Body Mass Index: 24.80 kg/m      Reproductive/Obstetrics negative OB ROS                             Anesthesia Physical Anesthesia Plan  ASA: 2  Anesthesia Plan: General   Post-op Pain Management:    Induction: Intravenous  PONV Risk Score and Plan: Propofol infusion and TIVA  Airway Management Planned: Natural Airway and Nasal Cannula  Additional Equipment:   Intra-op Plan:   Post-operative Plan:   Informed Consent: I have reviewed the patients History and Physical, chart, labs and discussed the procedure including the risks, benefits and alternatives for the proposed anesthesia with the patient or authorized representative who has indicated his/her understanding and acceptance.     Dental Advisory Given  Plan Discussed with: Anesthesiologist, CRNA and Surgeon  Anesthesia Plan Comments: (Patient consented for risks of anesthesia including but not  limited to:  - adverse reactions to medications - risk of airway placement if required - damage to eyes, teeth, lips or other oral mucosa - nerve damage due to positioning  - sore throat or hoarseness - Damage to heart, brain, nerves, lungs, other parts of body or loss of life  Patient voiced understanding.)        Anesthesia Quick Evaluation

## 2022-08-07 NOTE — H&P (Signed)
Natalie Lame, MD [address] [place], [place] [address] Phone:[phone] Fax : [phone]  Primary Care Physician:  Virginia Crews, MD Primary Gastroenterologist:  Dr. Allen Norris  Pre-Procedure History & Physical: HPI:  Natalie Dickerson is a 61 y.o. female is here for an colonoscopy.   Past Medical History:  Diagnosis Date   Actinic keratosis 05/01/2014   Left superior forehead   Cancer ([place])    skin ca   Hx of basal cell carcinoma 12/06/2013   Left sup forehead    Past Surgical History:  Procedure Laterality Date   EMBOLIZATION N/A 03/24/2017   Procedure: Embolization;  Surgeon: Algernon Huxley, MD;  Location: [place] [facility];  Service: Cardiovascular;  Laterality: N/A;   FINGER SURGERY     PERIPHERAL VASCULAR CATHETERIZATION N/A 01/18/2017   Procedure: Renal Angiography;  Surgeon: Algernon Huxley, MD;  Location: [place] [facility];  Service: Cardiovascular;  Laterality: N/A;   PERIPHERAL VASCULAR CATHETERIZATION  01/18/2017   Procedure: Renal Intervention;  Surgeon: Algernon Huxley, MD;  Location: [place] [facility];  Service: Cardiovascular;;   RENAL ANGIOGRAPHY N/A 03/24/2017   Procedure: Renal Angiography;  Surgeon: Algernon Huxley, MD;  Location: [place] [facility];  Service: Cardiovascular;  Laterality: N/A;    Prior to Admission medications   Medication Sig Start Date End Date Taking? Authorizing Provider  aspirin EC 81 MG EC tablet Take 1 tablet (81 mg total) by mouth daily. 01/19/17  Yes Dew, Erskine Squibb, MD  cholecalciferol (VITAMIN D) 1000 units tablet Take 1,000 Units by mouth daily.   Yes [provider]  fluorouracil (EFUDEX) 5 % cream Apply topically 2 (two) times daily. Twice daily as directed 12/01/21  Yes [name], MD  losartan (COZAAR) 25 MG tablet Take 1 tablet (25 mg total) by mouth daily. 05/12/22  Yes Bacigalupo, [name], MD  Multiple Vitamins-Minerals (MULTIVITAMIN WITH MINERALS) tablet Take 1 tablet by mouth daily.   Yes  [provider]    Allergies as of 05/13/2022 - Review Complete 05/13/2022  Allergen Reaction Noted   Bee venom Anaphylaxis 01/11/2017   Ivp dye [iodinated contrast media] Rash 03/23/2017    Family History  Problem Relation Age of Onset   Hypertension Mother    Breast cancer Mother 62   Cancer Mother        breast cancer   Breast cancer Father 88   Hypertension Brother     Social History   Socioeconomic History   Marital status: Married    Spouse name: Not on file   Number of children: 2   Years of education: Not on file   Highest education level: Not on file  Occupational History   Not on file  Tobacco Use   Smoking status: Former    Years: 5.00    Types: Cigarettes   Smokeless tobacco: Never   Tobacco comments:    Smoked for about 5 years, smoked about 1 pack per day. Quit in 1984  Vaping Use   Vaping Use: Never used  Substance and Sexual Activity   Alcohol use: Yes    Comment: Occasional alcohol use; Drinks 2-4 glasses of wine per week.   Drug use: No   Sexual activity: Not on file  Other Topics Concern   Not on file  Social History Narrative   Not on file   Social Determinants of Health   Financial Resource Strain: Not on file  Food Insecurity: Not on file  Transportation Needs: Not on file  Physical Activity: Not on file  Stress: Not on file  Social Connections: Not on file  Intimate Partner Violence: Not on file    Review of Systems: See HPI, otherwise negative ROS  Physical Exam: BP 116/78   Pulse 78   Temp (!) 96.8 F (36 C) (Temporal)   Resp 20   Ht '5\' 3"'$  (1.6 m)   Wt 63.5 kg   SpO2 98%   BMI 24.80 kg/m  General:   Alert,  pleasant and cooperative in NAD Head:  Normocephalic and atraumatic. Neck:  Supple; no masses or thyromegaly. Lungs:  Clear throughout to auscultation.    Heart:  Regular rate and rhythm. Abdomen:  Soft, nontender and nondistended. Normal bowel sounds, without guarding, and without rebound.    Neurologic:  Alert and  oriented x4;  grossly normal neurologically.  Impression/Plan: Natalie Dickerson is here for an colonoscopy to be performed for a history of adenomatous polyps on 2015   Risks, benefits, limitations, and alternatives regarding  colonoscopy have been reviewed with the patient.  Questions have been answered.  All parties agreeable.   Natalie Lame, MD  08/07/2022, 9:54 AM

## 2022-08-08 NOTE — Anesthesia Postprocedure Evaluation (Signed)
Anesthesia Post Note  [name]  Procedure(s) Performed: COLONOSCOPY WITH PROPOFOL  Patient location during evaluation: Endoscopy Anesthesia Type: General Level of consciousness: awake and alert Pain management: pain level controlled Vital Signs Assessment: post-procedure vital signs reviewed and stable Respiratory status: spontaneous breathing, nonlabored ventilation, respiratory function stable and patient connected to nasal cannula oxygen Cardiovascular status: blood pressure returned to baseline and stable Postop Assessment: no apparent nausea or vomiting Anesthetic complications: no   No notable events documented.   Last Vitals:  Vitals:   08/07/22 1033 08/07/22 1043  BP: (!) 101/48 112/75  Pulse:    Resp:    Temp:    SpO2:      Last Pain:  Vitals:   08/07/22 1043  TempSrc:   PainSc: 0-No pain                 Dimas Millin

## 2022-08-10 LAB — SURGICAL PATHOLOGY

## 2022-08-17 ENCOUNTER — Encounter: Payer: Self-pay | Admitting: Gastroenterology

## 2022-09-07 ENCOUNTER — Ambulatory Visit (INDEPENDENT_AMBULATORY_CARE_PROVIDER_SITE_OTHER): Payer: BC Managed Care – PPO | Admitting: Dermatology

## 2022-09-07 DIAGNOSIS — Z5111 Encounter for antineoplastic chemotherapy: Secondary | ICD-10-CM

## 2022-09-07 DIAGNOSIS — I781 Nevus, non-neoplastic: Secondary | ICD-10-CM

## 2022-09-07 DIAGNOSIS — L570 Actinic keratosis: Secondary | ICD-10-CM

## 2022-09-07 DIAGNOSIS — L820 Inflamed seborrheic keratosis: Secondary | ICD-10-CM | POA: Diagnosis not present

## 2022-09-07 DIAGNOSIS — L578 Other skin changes due to chronic exposure to nonionizing radiation: Secondary | ICD-10-CM

## 2022-09-07 NOTE — Patient Instructions (Addendum)
If you feel any thing in a month use Start 5-fluorouracil/calcipotriene cream used as a spot treatment to affected areas at forehead and face twice a day for 7 days to affected areas including    5-Fluorouracil/Calcipotriene Patient Education   Actinic keratoses are the dry, red scaly spots on the skin caused by sun damage. A portion of these spots can turn into skin cancer with time, and treating them can help prevent development of skin cancer.   Treatment of these spots requires removal of the defective skin cells. There are various ways to remove actinic keratoses, including freezing with liquid nitrogen, treatment with creams, or treatment with a blue light procedure in the office.   5-fluorouracil cream is a topical cream used to treat actinic keratoses. It works by interfering with the growth of abnormal fast-growing skin cells, such as actinic keratoses. These cells peel off and are replaced by healthy ones.   5-fluorouracil/calcipotriene is a combination of the 5-fluorouracil cream with a vitamin D analog cream called calcipotriene. The calcipotriene alone does not treat actinic keratoses. However, when it is combined with 5-fluorouracil, it helps the 5-fluorouracil treat the actinic keratoses much faster so that the same results can be achieved with a much shorter treatment time.  INSTRUCTIONS FOR 5-FLUOROURACIL/CALCIPOTRIENE CREAM:   5-fluorouracil/calcipotriene cream typically only needs to be used for 4-7 days. A thin layer should be applied twice a day to the treatment areas recommended by your physician.   If your physician prescribed you separate tubes of 5-fluourouracil and calcipotriene, apply a thin layer of 5-fluorouracil followed by a thin layer of calcipotriene.   Avoid contact with your eyes, nostrils, and mouth. Do not use 5-fluorouracil/calcipotriene cream on infected or open wounds.   You will develop redness, irritation and some crusting at areas where you have  pre-cancer damage/actinic keratoses. IF YOU DEVELOP PAIN, BLEEDING, OR SIGNIFICANT CRUSTING, STOP THE TREATMENT EARLY - you have already gotten a good response and the actinic keratoses should clear up well.  Wash your hands after applying 5-fluorouracil 5% cream on your skin.   A moisturizer or sunscreen with a minimum SPF 30 should be applied each morning.   Once you have finished the treatment, you can apply a thin layer of Vaseline twice a day to irritated areas to soothe and calm the areas more quickly. If you experience significant discomfort, contact your physician.  For some patients it is necessary to repeat the treatment for best results.  SIDE EFFECTS: When using 5-fluorouracil/calcipotriene cream, you may have mild irritation, such as redness, dryness, swelling, or a mild burning sensation. This usually resolves within 2 weeks. The more actinic keratoses you have, the more redness and inflammation you can expect during treatment. Eye irritation has been reported rarely. If this occurs, please let us know.  If you have any trouble using this cream, please call the office. If you have any other questions about this information, please do not hesitate to ask me before you leave the office.  Actinic keratoses are precancerous spots that appear secondary to cumulative UV radiation exposure/sun exposure over time. They are chronic with expected duration over 1 year. A portion of actinic keratoses will progress to squamous cell carcinoma of the skin. It is not possible to reliably predict which spots will progress to skin cancer and so treatment is recommended to prevent development of skin cancer.  Recommend daily broad spectrum sunscreen SPF 30+ to sun-exposed areas, reapply every 2 hours as needed.  Recommend staying in the  shade or wearing long sleeves, sun glasses (UVA+UVB protection) and wide brim hats (4-inch brim around the entire circumference of the hat). Call for new or changing  lesions.      Seborrheic Keratosis  What causes seborrheic keratoses? Seborrheic keratoses are harmless, common skin growths that first appear during adult life.  As time goes by, more growths appear.  Some people may develop a large number of them.  Seborrheic keratoses appear on both covered and uncovered body parts.  They are not caused by sunlight.  The tendency to develop seborrheic keratoses can be inherited.  They vary in color from skin-colored to gray, brown, or even black.  They can be either smooth or have a rough, warty surface.   Seborrheic keratoses are superficial and look as if they were stuck on the skin.  Under the microscope this type of keratosis looks like layers upon layers of skin.  That is why at times the top layer may seem to fall off, but the rest of the growth remains and re-grows.    Treatment Seborrheic keratoses do not need to be treated, but can easily be removed in the office.  Seborrheic keratoses often cause symptoms when they rub on clothing or jewelry.  Lesions can be in the way of shaving.  If they become inflamed, they can cause itching, soreness, or burning.  Removal of a seborrheic keratosis can be accomplished by freezing, burning, or surgery. If any spot bleeds, scabs, or grows rapidly, please return to have it checked, as these can be an indication of a skin cancer.  Cryotherapy Aftercare  Wash gently with soap and water everyday.   Apply Vaseline and Band-Aid daily until healed.       Due to recent changes in healthcare laws, you may see results of your pathology and/or laboratory studies on MyChart before the doctors have had a chance to review them. We understand that in some cases there may be results that are confusing or concerning to you. Please understand that not all results are received at the same time and often the doctors may need to interpret multiple results in order to provide you with the best plan of care or course of treatment.  Therefore, we ask that you please give [place] 2 business days to thoroughly review all your results before contacting the office for clarification. Should we see a critical lab result, you will be contacted sooner.   If You Need Anything After Your Visit  If you have any questions or concerns for your doctor, please call our main line at [phone] and press option 4 to reach your doctor's medical assistant. If no one answers, please leave a voicemail as directed and we will return your call as soon as possible. Messages left after 4 pm will be answered the following business day.   You may also send [place] a message via [place]. We typically respond to MyChart messages within 1-2 business days.  For prescription refills, please ask your pharmacy to contact our office. Our fax number is [phone].  If you have an urgent issue when the clinic is closed that cannot wait until the next business day, you can page your doctor at the number below.    Please note that while we do our best to be available for urgent issues outside of office hours, we are not available 24/7.   If you have an urgent issue and are unable to reach [place], you may choose to seek medical care at  your doctor's office, retail clinic, urgent care center, or emergency room.  If you have a medical emergency, please immediately call 911 or go to the emergency department.  Pager Numbers  - Dr. Nehemiah Massed: [phone]  - Dr. Laurence Ferrari: [phone]  - Dr. Nicole Kindred: [phone]  In the event of inclement weather, please call our main line at [phone] for an update on the status of any delays or closures.  Dermatology Medication Tips: Please keep the boxes that topical medications come in in order to help keep track of the instructions about where and how to use these. Pharmacies typically print the medication instructions only on the boxes and not directly on the medication tubes.   If your medication is too expensive, please  contact our office at [phone] option 4 or send [place] a message through [place].   We are unable to tell what your co-pay for medications will be in advance as this is different depending on your insurance coverage. However, we may be able to find a substitute medication at lower cost or fill out paperwork to get insurance to cover a needed medication.   If a prior authorization is required to get your medication covered by your insurance company, please allow [place] 1-2 business days to complete this process.  Drug prices often vary depending on where the prescription is filled and some pharmacies may offer cheaper prices.  The website www.goodrx.com contains coupons for medications through different pharmacies. The prices here do not account for what the cost may be with help from insurance (it may be cheaper with your insurance), but the website can give you the price if you did not use any insurance.  - You can print the associated coupon and take it with your prescription to the pharmacy.  - You may also stop by our office during regular business hours and pick up a GoodRx coupon card.  - If you need your prescription sent electronically to a different pharmacy, notify our office through [facility] or by phone at [phone] option 4.     Si Usted Necesita Algo Despus de Su Visita  Tambin puede enviarnos un mensaje a travs de Pharmacist, community. Por lo general respondemos a los mensajes de MyChart en el transcurso de 1 a 2 das hbiles.  Para renovar recetas, por favor pida a su farmacia que se ponga en contacto con nuestra oficina. Harland Dingwall de fax es [place] [phone].  Si tiene un asunto urgente cuando la clnica est cerrada y que no puede esperar hasta el siguiente da hbil, puede llamar/localizar a su doctor(a) al nmero que aparece a continuacin.   Por favor, tenga en cuenta que aunque hacemos todo lo posible para estar disponibles para asuntos urgentes fuera del horario de  [place], no estamos disponibles las 24 horas del da, los 7 das de la [place].   Si tiene un problema urgente y no puede comunicarse con nosotros, puede optar por buscar atencin mdica  en el consultorio de su doctor(a), en una clnica privada, en un centro de atencin urgente o en una sala de emergencias.  Si tiene Engineering geologist, por favor llame inmediatamente al 911 o vaya a la sala de emergencias.  Nmeros de bper  - Dr. Nehemiah Massed: [phone]  - Dra. Moye: [phone]  - Dra. Nicole Kindred: [phone]  En caso de inclemencias del [place], por favor llame a Johnsie Kindred principal al [phone] para una actualizacin sobre el [place] de cualquier retraso o cierre.  Consejos para la medicacin en dermatologa:  Por favor, guarde las cajas en las que vienen los medicamentos de uso tpico para ayudarle a seguir las instrucciones sobre dnde y cmo usarlos. Las farmacias generalmente imprimen las instrucciones del medicamento slo en las cajas y no directamente en los tubos del [place].   Si su medicamento es muy caro, por favor, pngase en contacto con Zigmund Daniel llamando al [phone] y presione la opcin 4 o envenos un mensaje a travs de Pharmacist, community.   No podemos decirle cul ser su copago por los medicamentos por adelantado ya que esto es diferente dependiendo de la cobertura de su seguro. Sin embargo, es posible que podamos encontrar un medicamento sustituto a Electrical engineer un formulario para que el seguro cubra el medicamento que se considera necesario.   Si se requiere una autorizacin previa para que su compaa de seguros [place] su medicamento, por favor permtanos de 1 a 2 das hbiles para completar este proceso.  Los precios de los medicamentos varan con frecuencia dependiendo del Environmental consultant de dnde se surte la receta y alguna farmacias pueden ofrecer precios ms baratos.  El sitio web www.goodrx.com tiene cupones para medicamentos de Airline pilot. Los  precios aqu no tienen en cuenta lo que podra costar con la ayuda del seguro (puede ser ms barato con su seguro), pero el sitio web puede darle el precio si no utiliz Research scientist (physical sciences).  - Puede imprimir el cupn correspondiente y llevarlo con su receta a la farmacia.  - Tambin puede pasar por nuestra oficina durante el horario de atencin regular y Charity fundraiser una tarjeta de cupones de GoodRx.  - Si necesita que su receta se enve electrnicamente a una farmacia diferente, informe a nuestra oficina a travs de MyChart de [place] o por telfono llamando al [phone] y presione la opcin 4.

## 2022-09-07 NOTE — Progress Notes (Unsigned)
Follow-Up Visit   Subjective  Natalie Dickerson is a 61 y.o. female who presents for the following: Skin Problem (Patient reports a spot at left upper forehead states has been there since using 5 f/u cream that did not go away. Also reports some other spots at ankles. ). The patient has spots, moles and lesions to be evaluated, some may be new or changing and the patient has concerns that these could be cancer.  The following portions of the chart were reviewed this encounter and updated as appropriate:  Tobacco  Allergies  Meds  Problems  Med Hx  Surg Hx  Fam Hx     Review of Systems: No other skin or systemic complaints except as noted in HPI or Assessment and Plan.  Objective  Well appearing patient in no apparent distress; mood and affect are within normal limits.  A focused examination was performed including face, forehead, right hand, b/l ankles. Relevant physical exam findings are noted in the Assessment and Plan.  left superior forehead x 1, right cheek x 1 (2) Erythematous thin papules/macules with gritty scale.   right hand x 1 Erythematous stuck-on, waxy papule or plaque   Assessment & Plan  Actinic keratosis (2) left superior forehead x 1, right cheek x 1  Recheck at next follow up at left superior forehead  Actinic keratoses are precancerous spots that appear secondary to cumulative UV radiation exposure/sun exposure over time. They are chronic with expected duration over 1 year. A portion of actinic keratoses will progress to squamous cell carcinoma of the skin. It is not possible to reliably predict which spots will progress to skin cancer and so treatment is recommended to prevent development of skin cancer.  Recommend daily broad spectrum sunscreen SPF 30+ to sun-exposed areas, reapply every 2 hours as needed.  Recommend staying in the shade or wearing long sleeves, sun glasses (UVA+UVB protection) and wide brim hats (4-inch brim around the entire circumference  of the hat). Call for new or changing lesions.  Destruction of lesion - left superior forehead x 1, right cheek x 1 Complexity: simple   Destruction method: cryotherapy   Informed consent: discussed and consent obtained   Timeout:  patient name, date of birth, surgical site, and procedure verified Lesion destroyed using liquid nitrogen: Yes   Region frozen until ice ball extended beyond lesion: Yes   Outcome: patient tolerated procedure well with no complications   Post-procedure details: wound care instructions given   Additional details:  Prior to procedure, discussed risks of blister formation, small wound, skin dyspigmentation, or rare scar following cryotherapy. Recommend Vaseline ointment to treated areas while healing.  Inflamed seborrheic keratosis right hand x 1 Symptomatic, irritating, patient would like treated. Destruction of lesion - right hand x 1 Complexity: simple   Destruction method: cryotherapy   Informed consent: discussed and consent obtained   Timeout:  patient name, date of birth, surgical site, and procedure verified Lesion destroyed using liquid nitrogen: Yes   Region frozen until ice ball extended beyond lesion: Yes   Outcome: patient tolerated procedure well with no complications   Post-procedure details: wound care instructions given   Additional details:  Prior to procedure, discussed risks of blister formation, small wound, skin dyspigmentation, or rare scar following cryotherapy. Recommend Vaseline ointment to treated areas while healing.  Telangiectasia At right ankle - Dilated blood vessel - Benign appearing on exam - Call for changes  Actinic Damage - Severe, confluent actinic changes with pre-cancerous actinic keratoses  -  Severe, chronic, not at goal, secondary to cumulative UV radiation exposure over time - diffuse scaly erythematous macules and papules with underlying dyspigmentation - Discussed Prescription "Field Treatment" for Severe,  Chronic Confluent Actinic Changes with Pre-Cancerous Actinic Keratoses Field treatment involves treatment of an entire area of skin that has confluent Actinic Changes (Sun/ Ultraviolet light damage) and PreCancerous Actinic Keratoses by method of PhotoDynamic Therapy (PDT) and/or prescription Topical Chemotherapy agents such as 5-fluorouracil, 5-fluorouracil/calcipotriene, and/or imiquimod.  The purpose is to decrease the number of clinically evident and subclinical PreCancerous lesions to prevent progression to development of skin cancer by chemically destroying early precancer changes that may or may not be visible.  It has been shown to reduce the risk of developing skin cancer in the treated area. As a result of treatment, redness, scaling, crusting, and open sores may occur during treatment course. One or more than one of these methods may be used and may have to be used several times to control, suppress and eliminate the PreCancerous changes. Discussed treatment course, expected reaction, and possible side effects. - Recommend daily broad spectrum sunscreen SPF 30+ to sun-exposed areas, reapply every 2 hours as needed.  - Staying in the shade or wearing long sleeves, sun glasses (UVA+UVB protection) and wide brim hats (4-inch brim around the entire circumference of the hat) are also recommended. - Call for new or changing lesions.  Restart in a month at affected areas of forehead and face.  5-fluorouracil/calcipotriene cream use as a spot treatment twice a day for 7 days to affected areas including  Return for keep follow up as scheduled in november . IRuthell Rummage, CMA, am acting as scribe for Sarina Ser, MD. Documentation: I have reviewed the above documentation for accuracy and completeness, and I agree with the above.  Sarina Ser, MD

## 2022-09-09 ENCOUNTER — Encounter: Payer: Self-pay | Admitting: Dermatology

## 2022-09-29 ENCOUNTER — Other Ambulatory Visit: Payer: Self-pay | Admitting: Family Medicine

## 2022-09-29 DIAGNOSIS — I150 Renovascular hypertension: Secondary | ICD-10-CM

## 2022-09-30 NOTE — Telephone Encounter (Signed)
Requested Prescriptions  Pending Prescriptions Disp Refills  . losartan (COZAAR) 25 MG tablet [Pharmacy Med Name: Losartan Potassium 25 MG Oral Tablet] 90 tablet 3    Sig: TAKE 1 TABLET BY MOUTH DAILY     Cardiovascular:  Angiotensin Receptor Blockers Passed - 09/29/2022 10:46 PM      Passed - Cr in normal range and within 180 days    Creat  Date Value Ref Range Status  10/12/2017 1.01 0.50 - 1.05 mg/dL Final    Comment:    For patients >60 years of age, the reference limit for Creatinine is approximately 13% higher for people identified as African-American. .    Creatinine, Ser  Date Value Ref Range Status  05/12/2022 0.99 0.57 - 1.00 mg/dL Final         Passed - K in normal range and within 180 days    Potassium  Date Value Ref Range Status  05/12/2022 4.8 3.5 - 5.2 mmol/L Final         Passed - Patient is not pregnant      Passed - Last BP in normal range    BP Readings from Last 1 Encounters:  08/07/22 112/75         Passed - Valid encounter within last 6 months    Recent Outpatient Visits          4 months ago Encounter for annual health examination   [facility] [place], [name], MD   9 months ago Renovascular hypertension   [facility] Gwyneth Sprout, FNP   1 year ago Annual physical exam   [place], [name], [place]   1 year ago Ganglion cyst of right foot   [facility] [place], [name], [place]   1 year ago [place], [name], MD      Future Appointments            In 4 weeks [name], MD [place]   In 1 month [place], [name], MD [facility], [place]   In 7 months [place], [name], MD [facility], [place]

## 2022-10-26 ENCOUNTER — Encounter (INDEPENDENT_AMBULATORY_CARE_PROVIDER_SITE_OTHER): Payer: Self-pay

## 2022-10-28 ENCOUNTER — Ambulatory Visit (INDEPENDENT_AMBULATORY_CARE_PROVIDER_SITE_OTHER): Payer: BC Managed Care – PPO | Admitting: Dermatology

## 2022-10-28 DIAGNOSIS — L578 Other skin changes due to chronic exposure to nonionizing radiation: Secondary | ICD-10-CM | POA: Diagnosis not present

## 2022-10-28 DIAGNOSIS — Z872 Personal history of diseases of the skin and subcutaneous tissue: Secondary | ICD-10-CM | POA: Diagnosis not present

## 2022-10-28 DIAGNOSIS — I781 Nevus, non-neoplastic: Secondary | ICD-10-CM | POA: Diagnosis not present

## 2022-10-28 NOTE — Patient Instructions (Signed)
Due to recent changes in healthcare laws, you may see results of your pathology and/or laboratory studies on MyChart before the doctors have had a chance to review them. We understand that in some cases there may be results that are confusing or concerning to you. Please understand that not all results are received at the same time and often the doctors may need to interpret multiple results in order to provide you with the best plan of care or course of treatment. Therefore, we ask that you please give us 2 business days to thoroughly review all your results before contacting the office for clarification. Should we see a critical lab result, you will be contacted sooner.   If You Need Anything After Your Visit  If you have any questions or concerns for your doctor, please call our main line at [phone] and press option 4 to reach your doctor's medical assistant. If no one answers, please leave a voicemail as directed and we will return your call as soon as possible. Messages left after 4 pm will be answered the following business day.   You may also send us a message via MyChart. We typically respond to MyChart messages within 1-2 business days.  For prescription refills, please ask your pharmacy to contact our office. Our fax number is [phone].  If you have an urgent issue when the clinic is closed that cannot wait until the next business day, you can page your doctor at the number below.    Please note that while we do our best to be available for urgent issues outside of office hours, we are not available 24/7.   If you have an urgent issue and are unable to reach us, you may choose to seek medical care at your doctor's office, retail clinic, urgent care center, or emergency room.  If you have a medical emergency, please immediately call 911 or go to the emergency department.  Pager Numbers  - Dr. Kowalski: [phone]  - Dr. Moye: [phone]  - Dr. Stewart:  [phone]  In the event of inclement weather, please call our main line at [phone] for an update on the status of any delays or closures.  Dermatology Medication Tips: Please keep the boxes that topical medications come in in order to help keep track of the instructions about where and how to use these. Pharmacies typically print the medication instructions only on the boxes and not directly on the medication tubes.   If your medication is too expensive, please contact our office at [phone] option 4 or send us a message through MyChart.   We are unable to tell what your co-pay for medications will be in advance as this is different depending on your insurance coverage. However, we may be able to find a substitute medication at lower cost or fill out paperwork to get insurance to cover a needed medication.   If a prior authorization is required to get your medication covered by your insurance company, please allow us 1-2 business days to complete this process.  Drug prices often vary depending on where the prescription is filled and some pharmacies may offer cheaper prices.  The website www.goodrx.com contains coupons for medications through different pharmacies. The prices here do not account for what the cost may be with help from insurance (it may be cheaper with your insurance), but the website can give you the price if you did not use any insurance.  - You can print the associated coupon and take it with   your prescription to the pharmacy.  - You may also stop by our office during regular business hours and pick up a GoodRx coupon card.  - If you need your prescription sent electronically to a different pharmacy, notify our office through [facility] MyChart or by phone at [phone] option 4.     Si Usted Necesita Algo Despus de Su Visita  Tambin puede enviarnos un mensaje a travs de MyChart. Por lo general respondemos a los mensajes de MyChart en el transcurso de 1 a 2  das hbiles.  Para renovar recetas, por favor pida a su farmacia que se ponga en contacto con nuestra oficina. Nuestro nmero de fax es el [phone].  Si tiene un asunto urgente cuando la clnica est cerrada y que no puede esperar hasta el siguiente da hbil, puede llamar/localizar a su doctor(a) al nmero que aparece a continuacin.   Por favor, tenga en cuenta que aunque hacemos todo lo posible para estar disponibles para asuntos urgentes fuera del horario de oficina, no estamos disponibles las 24 horas del da, los 7 das de la semana.   Si tiene un problema urgente y no puede comunicarse con nosotros, puede optar por buscar atencin mdica  en el consultorio de su doctor(a), en una clnica privada, en un centro de atencin urgente o en una sala de emergencias.  Si tiene una emergencia mdica, por favor llame inmediatamente al 911 o vaya a la sala de emergencias.  Nmeros de bper  - Dr. Kowalski: [phone]  - Dra. Moye: [phone]  - Dra. Stewart: [phone]  En caso de inclemencias del tiempo, por favor llame a nuestra lnea principal al [phone] para una actualizacin sobre el estado de cualquier retraso o cierre.  Consejos para la medicacin en dermatologa: Por favor, guarde las cajas en las que vienen los medicamentos de uso tpico para ayudarle a seguir las instrucciones sobre dnde y cmo usarlos. Las farmacias generalmente imprimen las instrucciones del medicamento slo en las cajas y no directamente en los tubos del medicamento.   Si su medicamento es muy caro, por favor, pngase en contacto con nuestra oficina llamando al [phone] y presione la opcin 4 o envenos un mensaje a travs de MyChart.   No podemos decirle cul ser su copago por los medicamentos por adelantado ya que esto es diferente dependiendo de la cobertura de su seguro. Sin embargo, es posible que podamos encontrar un medicamento sustituto a menor costo o llenar un formulario para que el  seguro cubra el medicamento que se considera necesario.   Si se requiere una autorizacin previa para que su compaa de seguros cubra su medicamento, por favor permtanos de 1 a 2 das hbiles para completar este proceso.  Los precios de los medicamentos varan con frecuencia dependiendo del lugar de dnde se surte la receta y alguna farmacias pueden ofrecer precios ms baratos.  El sitio web www.goodrx.com tiene cupones para medicamentos de diferentes farmacias. Los precios aqu no tienen en cuenta lo que podra costar con la ayuda del seguro (puede ser ms barato con su seguro), pero el sitio web puede darle el precio si no utiliz ningn seguro.  - Puede imprimir el cupn correspondiente y llevarlo con su receta a la farmacia.  - Tambin puede pasar por nuestra oficina durante el horario de atencin regular y recoger una tarjeta de cupones de GoodRx.  - Si necesita que su receta se enve electrnicamente a una farmacia diferente, informe a nuestra oficina a travs de MyChart de [facility]   o por telfono llamando al [phone] y presione la opcin 4.  

## 2022-10-28 NOTE — Progress Notes (Signed)
   Follow-Up Visit   Subjective  Natalie Dickerson is a 61 y.o. female who presents for the following: Actinic Keratosis (9 month follow up face and chest treated with 5FU/Calcipotriene cream - chest cleared up. She did retreat her face about 4 weeks ago.). The patient has spots, moles and lesions to be evaluated, some may be new or changing and the patient has concerns that these could be cancer.  The following portions of the chart were reviewed this encounter and updated as appropriate:   Tobacco  Allergies  Meds  Problems  Med Hx  Surg Hx  Fam Hx     Review of Systems:  No other skin or systemic complaints except as noted in HPI or Assessment and Plan.  Objective  Well appearing patient in no apparent distress; mood and affect are within normal limits.  A focused examination was performed including face, chest. Relevant physical exam findings are noted in the Assessment and Plan.  Face, chest Clear today  Face Dilated blood vessels   Assessment & Plan   Actinic Damage - chronic, secondary to cumulative UV radiation exposure/sun exposure over time - diffuse scaly erythematous macules with underlying dyspigmentation - Recommend daily broad spectrum sunscreen SPF 30+ to sun-exposed areas, reapply every 2 hours as needed.  - Recommend staying in the shade or wearing long sleeves, sun glasses (UVA+UVB protection) and wide brim hats (4-inch brim around the entire circumference of the hat). - Call for new or changing lesions.  History of actinic keratoses Face, chest Clear. Observe for recurrence. Call clinic for new or changing lesions.  Recommend regular skin exams, daily broad-spectrum spf 30+ sunscreen use, and photoprotection.    She may consider spot treating any areas that come up with Fluorouracil 5%/Calcipotriene cream.  Telangiectasia Face Benign-appearing.  Observation.  Call clinic for new or changing lesions.  Recommend daily use of broad spectrum spf 30+  sunscreen to sun-exposed areas.   Return in about 6 months (around 04/28/2023) for AK follow up.  I, Ashok Cordia, CMA, am acting as scribe for Sarina Ser, MD . Documentation: I have reviewed the above documentation for accuracy and completeness, and I agree with the above.  Sarina Ser, MD

## 2022-11-12 ENCOUNTER — Ambulatory Visit: Payer: BC Managed Care – PPO | Admitting: Family Medicine

## 2022-11-12 ENCOUNTER — Encounter: Payer: Self-pay | Admitting: Dermatology

## 2022-11-27 NOTE — Progress Notes (Signed)
I,Sulibeya S Dimas,acting as a Neurosurgeon for Shirlee Latch, MD.,have documented all relevant documentation on the behalf of Shirlee Latch, MD,as directed by  Shirlee Latch, MD while in the presence of Shirlee Latch, MD.     Established patient visit   [name]   DOB: 22-Jan-1961   61 y.o. Female  MRN: [id number] Visit Date: 11/30/2022  Today's healthcare provider: Shirlee Latch, MD   Chief Complaint  Patient presents with   Follow-up   Hyperglycemia   Subjective    HPI  Prediabetes, Follow-up  Lab Results  Component Value Date   HGBA1C 5.6 05/12/2022   HGBA1C 5.7 (H) 12/26/2020   HGBA1C 5.3 12/26/2019   GLUCOSE 83 05/12/2022   GLUCOSE 86 12/26/2020   GLUCOSE 91 12/26/2019    Last seen for for this7 months ago.  Management since that visit includes no changes. Current symptoms include none and have been stable.  Prior visit with dietician: no Current diet: in general, a "healthy" diet    Pertinent Labs:    Component Value Date/Time   CHOL 192 05/12/2022 1136   TRIG 95 05/12/2022 1136   CHOLHDL 2.5 12/26/2020 1532   CHOLHDL 2.1 10/12/2017 0802   CREATININE 0.99 05/12/2022 1136   CREATININE 1.01 10/12/2017 0802    Wt Readings from Last 3 Encounters:  11/30/22 141 lb 14.4 oz (64.4 kg)  08/07/22 140 lb (63.5 kg)  05/12/22 137 lb 3.2 oz (62.2 kg)    ----------------------------------------------------------------------------------------- Follow up for renovascular hypertension  The patient was last seen for this 7 months ago. Changes made at last visit include increase losartan to 25 mg daily. Consider d/c'ing if continues to be low.  She reports excellent compliance with treatment. She feels that condition is Improved. She is not having side effects.   BP Readings from Last 3 Encounters:  11/30/22 128/84  08/07/22 112/75  05/12/22 96/78    ----------------------------------------------------------------------------------------- Vulvar itching x1 mont. No vaginal discharge. No rash. No new soaps, detergents, pads. Seems to happen after shaving. Changed her razor  Medications: Outpatient Medications Prior to Visit  Medication Sig   aspirin EC 81 MG EC tablet Take 1 tablet (81 mg total) by mouth daily.   cholecalciferol (VITAMIN D) 1000 units tablet Take 1,000 Units by mouth daily.   fluorouracil (EFUDEX) 5 % cream Apply topically 2 (two) times daily. Twice daily as directed   losartan (COZAAR) 25 MG tablet TAKE 1 TABLET BY MOUTH DAILY   Multiple Vitamins-Minerals (MULTIVITAMIN WITH MINERALS) tablet Take 1 tablet by mouth daily.   No facility-administered medications prior to visit.    Review of Systems  Constitutional:  Positive for fatigue. Negative for appetite change.  Respiratory:  Negative for shortness of breath.   Cardiovascular:  Negative for chest pain and leg swelling.  Gastrointestinal:  Negative for abdominal pain, nausea and vomiting.  Neurological:  Negative for dizziness, light-headedness and headaches.       Objective    BP 128/84 (BP Location: Left Arm, Patient Position: Sitting, Cuff Size: Normal)   Pulse 72   Temp 98 F (36.7 C) (Oral)   Ht 5\' 3"  (1.6 m)   Wt 141 lb 14.4 oz (64.4 kg)   BMI 25.14 kg/m    Physical Exam Vitals reviewed.  Constitutional:      General: She is not in acute distress.    Appearance: Normal appearance. She is well-developed. She is not diaphoretic.  HENT:     Head: Normocephalic and atraumatic.  Eyes:  General: No scleral icterus.    Conjunctiva/sclera: Conjunctivae normal.  Neck:     Thyroid: No thyromegaly.  Cardiovascular:     Rate and Rhythm: Normal rate and regular rhythm.     Pulses: Normal pulses.     Heart sounds: Normal heart sounds. No murmur heard. Pulmonary:     Effort: Pulmonary effort is normal. No respiratory distress.     Breath  sounds: Normal breath sounds. No wheezing, rhonchi or rales.  Genitourinary:    General: Normal vulva.     Exam position: Lithotomy position.     Pubic Area: No rash.   Musculoskeletal:     Cervical back: Neck supple.     Right lower leg: No edema.     Left lower leg: No edema.  Lymphadenopathy:     Cervical: No cervical adenopathy.  Skin:    General: Skin is warm and dry.     Findings: No rash.  Neurological:     Mental Status: She is alert and oriented to person, place, and time. Mental status is at baseline.  Psychiatric:        Mood and Affect: Mood normal.        Behavior: Behavior normal.       No results found for any visits on 11/30/22.  Assessment & Plan     Problem List Items Addressed This Visit       Cardiovascular and Mediastinum   Renovascular hypertension    Well controlled Continue current medications Recheck metabolic panel F/u in 6 months       Relevant Orders   Basic Metabolic Panel (BMET)     Genitourinary   Vulvar itching    New problem May be related to hair regrowth No rash or discharge Trial of hydrocortison cream BID She may consider waxing Consider estrogen cream, but she denies any dryness/dyspareunia at this time        Other   Prediabetes - Primary    Recommend low carb diet Recheck A1c       Relevant Orders   Hemoglobin A1c     Return in about 6 months (around 06/01/2023) for CPE.      I, Shirlee Latch, MD, have reviewed all documentation for this visit. The documentation on 11/30/22 for the exam, diagnosis, procedures, and orders are all accurate and complete.   Haedyn Ancrum, Marzella Schlein, MD, MPH [facility]

## 2022-11-30 ENCOUNTER — Encounter: Payer: Self-pay | Admitting: Family Medicine

## 2022-11-30 ENCOUNTER — Ambulatory Visit (INDEPENDENT_AMBULATORY_CARE_PROVIDER_SITE_OTHER): Payer: BC Managed Care – PPO | Admitting: Family Medicine

## 2022-11-30 VITALS — BP 128/84 | HR 72 | Temp 98.0°F | Ht 63.0 in | Wt 141.9 lb

## 2022-11-30 DIAGNOSIS — R7303 Prediabetes: Secondary | ICD-10-CM

## 2022-11-30 DIAGNOSIS — I150 Renovascular hypertension: Secondary | ICD-10-CM

## 2022-11-30 DIAGNOSIS — L292 Pruritus vulvae: Secondary | ICD-10-CM | POA: Diagnosis not present

## 2022-11-30 NOTE — Assessment & Plan Note (Signed)
Well controlled Continue current medications Recheck metabolic panel F/u in 6 months  

## 2022-11-30 NOTE — Assessment & Plan Note (Signed)
Recommend low carb diet °Recheck A1c  °

## 2022-11-30 NOTE — Assessment & Plan Note (Signed)
New problem May be related to hair regrowth No rash or discharge Trial of hydrocortison cream BID She may consider waxing Consider estrogen cream, but she denies any dryness/dyspareunia at this time

## 2022-12-01 LAB — BASIC METABOLIC PANEL
BUN/Creatinine Ratio: 15 (ref 9–23)
BUN: 13 mg/dL (ref 6–24)
CO2: 23 mmol/L (ref 20–29)
Calcium: 9.5 mg/dL (ref 8.7–10.2)
Chloride: 106 mmol/L (ref 96–106)
Creatinine, Ser: 0.89 mg/dL (ref 0.57–1.00)
Glucose: 85 mg/dL (ref 70–99)
Potassium: 4.1 mmol/L (ref 3.5–5.2)
Sodium: 143 mmol/L (ref 134–144)
eGFR: 75 mL/min/{1.73_m2} (ref >59)

## 2022-12-01 LAB — HEMOGLOBIN A1C
Est. average glucose Bld gHb Est-mCnc: 111 mg/dL
Hgb A1c MFr Bld: 5.5 % (ref 4.8–5.6)

## 2022-12-06 ENCOUNTER — Other Ambulatory Visit: Payer: Self-pay | Admitting: Family Medicine

## 2022-12-06 DIAGNOSIS — I150 Renovascular hypertension: Secondary | ICD-10-CM

## 2022-12-12 ENCOUNTER — Encounter: Payer: Self-pay | Admitting: Family Medicine

## 2022-12-14 ENCOUNTER — Telehealth: Payer: Self-pay | Admitting: Family Medicine

## 2022-12-14 NOTE — Telephone Encounter (Signed)
Pt. Wanted to review last labs, verbalizes understanding.

## 2022-12-15 ENCOUNTER — Other Ambulatory Visit: Payer: Self-pay | Admitting: *Deleted

## 2022-12-15 DIAGNOSIS — I150 Renovascular hypertension: Secondary | ICD-10-CM

## 2022-12-15 MED ORDER — LOSARTAN POTASSIUM 25 MG PO TABS: 25.0000 mg | ORAL_TABLET | Freq: Every day | ORAL | 3 refills | Status: DC

## 2023-01-06 ENCOUNTER — Other Ambulatory Visit (INDEPENDENT_AMBULATORY_CARE_PROVIDER_SITE_OTHER): Payer: Self-pay | Admitting: Vascular Surgery

## 2023-01-06 DIAGNOSIS — I722 Aneurysm of renal artery: Secondary | ICD-10-CM

## 2023-01-12 ENCOUNTER — Ambulatory Visit (INDEPENDENT_AMBULATORY_CARE_PROVIDER_SITE_OTHER): Payer: BC Managed Care – PPO

## 2023-01-12 ENCOUNTER — Ambulatory Visit (INDEPENDENT_AMBULATORY_CARE_PROVIDER_SITE_OTHER): Payer: BC Managed Care – PPO | Admitting: Vascular Surgery

## 2023-01-12 ENCOUNTER — Encounter (INDEPENDENT_AMBULATORY_CARE_PROVIDER_SITE_OTHER): Payer: Self-pay | Admitting: Vascular Surgery

## 2023-01-12 VITALS — BP 120/73 | HR 69 | Resp 16 | Wt 142.6 lb

## 2023-01-12 DIAGNOSIS — I150 Renovascular hypertension: Secondary | ICD-10-CM | POA: Diagnosis not present

## 2023-01-12 DIAGNOSIS — H93A9 Pulsatile tinnitus, unspecified ear: Secondary | ICD-10-CM | POA: Diagnosis not present

## 2023-01-12 DIAGNOSIS — I722 Aneurysm of renal artery: Secondary | ICD-10-CM

## 2023-01-12 NOTE — Progress Notes (Signed)
MRN : [id number]  Natalie Dickerson is a 62 y.o. (14-Mar-1961) female who presents with chief complaint of  Chief Complaint  Patient presents with   Follow-up    Ultrasound follow up  .  History of Present Illness: Patient returns today in follow up of renal artery aneurysm disease.  Several years ago, she underwent intervention for a branch right renal artery aneurysm which ultimately led to thrombosis of her right renal artery.  As part of this, her blood pressure control has markedly improved.  She is down to a lower dose of 1 medication after previously being on multiple medications.  Her blood pressure is under excellent control.  Her renal function remains relatively normal as far she knows.  Her duplex today shows an atretic right kidney with no right renal artery flow and a patent left renal artery without hemodynamically significant stenosis or aneurysm identified.  Current Outpatient Medications  Medication Sig Dispense Refill   aspirin EC 81 MG EC tablet Take 1 tablet (81 mg total) by mouth daily. 90 tablet 3   cholecalciferol (VITAMIN D) 1000 units tablet Take 1,000 Units by mouth daily.     fluorouracil (EFUDEX) 5 % cream Apply topically 2 (two) times daily. Twice daily as directed 15 g 2   losartan (COZAAR) 25 MG tablet Take 1 tablet (25 mg total) by mouth daily. 90 tablet 3   Multiple Vitamins-Minerals (MULTIVITAMIN WITH MINERALS) tablet Take 1 tablet by mouth daily.     No current facility-administered medications for this visit.    Past Medical History:  Diagnosis Date   Actinic keratosis 05/01/2014   Left superior forehead   Cancer ([place])    skin ca   Hx of basal cell carcinoma 12/06/2013   Left sup forehead    Past Surgical History:  Procedure Laterality Date   COLONOSCOPY WITH PROPOFOL N/A 08/07/2022   Procedure: COLONOSCOPY WITH PROPOFOL;  Surgeon: Lucilla Lame, MD;  Location: [facility] ENDOSCOPY;  Service: Endoscopy;  Laterality: N/A;   EMBOLIZATION N/A 03/24/2017    Procedure: Embolization;  Surgeon: Algernon Huxley, MD;  Location: [facility] CV LAB;  Service: Cardiovascular;  Laterality: N/A;   FINGER SURGERY     PERIPHERAL VASCULAR CATHETERIZATION N/A 01/18/2017   Procedure: Renal Angiography;  Surgeon: Algernon Huxley, MD;  Location: [employer] [facility] CV LAB;  Service: Cardiovascular;  Laterality: N/A;   PERIPHERAL VASCULAR CATHETERIZATION  01/18/2017   Procedure: Renal Intervention;  Surgeon: Algernon Huxley, MD;  Location: [facility] CV LAB;  Service: Cardiovascular;;   RENAL ANGIOGRAPHY N/A 03/24/2017   Procedure: Renal Angiography;  Surgeon: Algernon Huxley, MD;  Location: [place] CV LAB;  Service: Cardiovascular;  Laterality: N/A;     Social History   Tobacco Use   Smoking status: Former    Years: 5.00    Types: Cigarettes   Smokeless tobacco: Never   Tobacco comments:    Smoked for about 5 years, smoked about 1 pack per day. Quit in 1984  Vaping Use   Vaping Use: Never used  Substance Use Topics   Alcohol use: Yes    Comment: Occasional alcohol use; Drinks 2-4 glasses of wine per week.   Drug use: No      Family History  Problem Relation Age of Onset   Hypertension Mother    Breast cancer Mother 11   Cancer Mother        breast cancer   Breast cancer Father 75   Hypertension Brother  Allergies  Allergen Reactions   Bee Venom Anaphylaxis   Ivp Dye [Iodinated Contrast Media] Rash     REVIEW OF SYSTEMS (Negative unless checked)  Constitutional: '[]'$ Weight loss  '[]'$ Fever  '[]'$ Chills Cardiac: '[]'$ Chest pain   '[]'$ Chest pressure   '[]'$ Palpitations   '[]'$ Shortness of breath when laying flat   '[]'$ Shortness of breath at rest   '[]'$ Shortness of breath with exertion. Vascular:  '[]'$ Pain in legs with walking   '[]'$ Pain in legs at rest   '[]'$ Pain in legs when laying flat   '[]'$ Claudication   '[]'$ Pain in feet when walking  '[]'$ Pain in feet at rest  '[]'$ Pain in feet when laying flat   '[]'$ History of DVT   '[]'$ Phlebitis   '[]'$ Swelling in legs   '[]'$ Varicose veins    '[]'$ Non-healing ulcers Pulmonary:   '[]'$ Uses home oxygen   '[]'$ Productive cough   '[]'$ Hemoptysis   '[]'$ Wheeze  '[]'$ COPD   '[]'$ Asthma Neurologic:  '[]'$ Dizziness  '[]'$ Blackouts   '[]'$ Seizures   '[]'$ History of stroke   '[]'$ History of TIA  '[]'$ Aphasia   '[]'$ Temporary blindness   '[]'$ Dysphagia   '[]'$ Weakness or numbness in arms   '[]'$ Weakness or numbness in legs Musculoskeletal:  '[x]'$ Arthritis   '[]'$ Joint swelling   '[]'$ Joint pain   '[x]'$ Low back pain Hematologic:  '[]'$ Easy bruising  '[]'$ Easy bleeding   '[]'$ Hypercoagulable state   '[]'$ Anemic   Gastrointestinal:  '[]'$ Blood in stool   '[]'$ Vomiting blood  '[]'$ Gastroesophageal reflux/heartburn   '[]'$ Abdominal pain Genitourinary:  '[]'$ Chronic kidney disease   '[]'$ Difficult urination  '[]'$ Frequent urination  '[]'$ Burning with urination   '[]'$ Hematuria Skin:  '[]'$ Rashes   '[]'$ Ulcers   '[]'$ Wounds Psychological:  '[]'$ History of anxiety   '[]'$  History of major depression.  Physical Examination  BP 120/73 (BP Location: Left Arm)   Pulse 69   Resp 16   Wt 142 lb 9.6 oz (64.7 kg)   BMI 25.26 kg/m  Gen:  WD/WN, NAD Head: [place]/AT, No temporalis wasting. Ear/Nose/Throat: Hearing grossly intact, nares w/o erythema or drainage Eyes: Conjunctiva clear. Sclera non-icteric Neck: Supple.  Trachea midline Pulmonary:  Good air movement, no use of accessory muscles.  Cardiac: RRR, no JVD Vascular:  Vessel Right Left  Radial Palpable Palpable                                   Gastrointestinal: soft, non-tender/non-distended. No guarding/reflex.  Musculoskeletal: M/S 5/5 throughout.  No deformity or atrophy. No edema. Neurologic: Sensation grossly intact in extremities.  Symmetrical.  Speech is fluent.  Psychiatric: Judgment intact, Mood & affect appropriate for pt's clinical situation. Dermatologic: No rashes or ulcers noted.  No cellulitis or open wounds.      Labs Recent Results (from the past 2160 hour(s))  Hemoglobin A1c     Status: None   Collection Time: 11/30/22  3:55 PM  Result Value Ref Range   Hgb A1c MFr Bld 5.5  4.8 - 5.6 %    Comment:          Prediabetes: 5.7 - 6.4          Diabetes: >6.4          Glycemic control for adults with diabetes: <7.0    Est. average glucose Bld gHb Est-mCnc 111 mg/dL  Basic Metabolic Panel (BMET)     Status: None   Collection Time: 11/30/22  3:55 PM  Result Value Ref Range   Glucose 85 70 - 99 mg/dL   BUN 13 6 - 24 mg/dL  Creatinine, Ser 0.89 0.57 - 1.00 mg/dL   eGFR 75 >59 mL/min/1.73   BUN/Creatinine Ratio 15 9 - 23   Sodium 143 134 - 144 mmol/L   Potassium 4.1 3.5 - 5.2 mmol/L   Chloride 106 96 - 106 mmol/L   CO2 23 20 - 29 mmol/L   Calcium 9.5 8.7 - 10.2 mg/dL    Radiology No results found.  Assessment/Plan Pulsatile tinnitus Carotid duplex was normal.  Recheck as needed   Renovascular hypertension Better after treatment for her renal artery aneurysm.  Has been backing off her medicine and will discuss this with her primary care physician.  Renal artery aneurysm of native kidney [facility]) Her duplex today shows an atretic right kidney with no right renal artery flow and a patent left renal artery without hemodynamically significant stenosis or aneurysm identified. Doing well.  Will continue renal artery duplex on an annual basis for her solitary kidney evaluating for stenosis or aneurysm.    Leotis Pain, MD  01/12/2023 11:34 AM    This note was created with Dragon medical transcription system.  Any errors from dictation are purely unintentional

## 2023-01-12 NOTE — Assessment & Plan Note (Signed)
Her duplex today shows an atretic right kidney with no right renal artery flow and a patent left renal artery without hemodynamically significant stenosis or aneurysm identified. Doing well.  Will continue renal artery duplex on an annual basis for her solitary kidney evaluating for stenosis or aneurysm.

## 2023-01-22 ENCOUNTER — Encounter: Payer: Self-pay | Admitting: Physician Assistant

## 2023-01-22 ENCOUNTER — Ambulatory Visit (INDEPENDENT_AMBULATORY_CARE_PROVIDER_SITE_OTHER): Payer: BC Managed Care – PPO | Admitting: Physician Assistant

## 2023-01-22 VITALS — BP 125/72 | HR 89 | Temp 99.2°F | Ht 63.0 in | Wt 144.4 lb

## 2023-01-22 DIAGNOSIS — J209 Acute bronchitis, unspecified: Secondary | ICD-10-CM | POA: Diagnosis not present

## 2023-01-22 DIAGNOSIS — J219 Acute bronchiolitis, unspecified: Secondary | ICD-10-CM

## 2023-01-22 DIAGNOSIS — R051 Acute cough: Secondary | ICD-10-CM

## 2023-01-22 LAB — POC COVID19 BINAXNOW: SARS Coronavirus 2 Ag: NEGATIVE

## 2023-01-22 MED ORDER — BENZONATATE 100 MG PO CAPS: 100.0000 mg | ORAL_CAPSULE | Freq: Two times a day (BID) | ORAL | 0 refills | Status: DC | PRN

## 2023-01-22 NOTE — Progress Notes (Signed)
I,Sha'taria Tyson,acting as a Education administrator for [employer], PA-C.,have documented all relevant documentation on the behalf of Natalie Kirschner, PA-C,as directed by  Natalie Kirschner, PA-C while in the presence of Natalie Kirschner, PA-C.   Established patient visit   [name]   DOB: 05-26-1961   62 y.o. Female  MRN: [id number] Visit Date: 01/22/2023  Today's healthcare provider: [name], PA-C   Cc. Cough, sore throat x 3 days  Subjective    Cough This is a new problem. The current episode started in the past 7 days (cough - 3 days; sore throat today). The problem has been gradually worsening. The problem occurs every few minutes. The cough is Non-productive. Associated symptoms include headaches and a sore throat. Nothing aggravates the symptoms. Treatments tried: tylenol and mucinex. The treatment provided mild relief.    Patient reports no contact with anyone sick and no fevers to report. Denies SOB, wheezing.  Medications: Outpatient Medications Prior to Visit  Medication Sig   aspirin EC 81 MG EC tablet Take 1 tablet (81 mg total) by mouth daily.   cholecalciferol (VITAMIN D) 1000 units tablet Take 1,000 Units by mouth daily.   fluorouracil (EFUDEX) 5 % cream Apply topically 2 (two) times daily. Twice daily as directed   losartan (COZAAR) 25 MG tablet Take 1 tablet (25 mg total) by mouth daily.   Multiple Vitamins-Minerals (MULTIVITAMIN WITH MINERALS) tablet Take 1 tablet by mouth daily.   No facility-administered medications prior to visit.    Review of Systems  HENT:  Positive for sore throat.   Respiratory:  Positive for cough.   Neurological:  Positive for headaches.      Objective    Blood pressure 125/72, pulse 89, temperature 99.2 F (37.3 C), temperature source Oral, height '5\' 3"'$  (1.6 m), weight 144 lb 6.4 oz (65.5 kg), SpO2 100 %.   Physical Exam Constitutional:      General: She is awake.     Appearance: She is well-developed.  HENT:      Head: Normocephalic.     Nose: Rhinorrhea present.     Mouth/Throat:     Pharynx: Posterior oropharyngeal erythema present. No oropharyngeal exudate.  Eyes:     Conjunctiva/sclera: Conjunctivae normal.  Cardiovascular:     Rate and Rhythm: Normal rate and regular rhythm.     Heart sounds: Normal heart sounds.  Pulmonary:     Effort: Pulmonary effort is normal.     Breath sounds: Normal breath sounds.  Skin:    General: Skin is warm.  Neurological:     Mental Status: She is alert and oriented to person, place, and time.  Psychiatric:        Attention and Perception: Attention normal.        Mood and Affect: Mood normal.        Speech: Speech normal.        Behavior: Behavior is cooperative.      Results for orders placed or performed in visit on 01/22/23  POC COVID-19  Result Value Ref Range   SARS Coronavirus 2 Ag Negative Negative    Assessment & Plan     Acute bronchitis Poc covid negative. Pt declined poc flu Advised likely viral in etiology Increase fluids, rest, tylenol, mucinex. Rx tessalon for cough  F/u for recommendations if no improvement day 10-14   Return if symptoms worsen or fail to improve.      I, Natalie Kirschner, PA-C have reviewed all documentation for  this visit. The documentation on  01/22/23 for the exam, diagnosis, procedures, and orders are all accurate and complete.  Natalie Kirschner, PA-C [facility] [address] [place], [place], [address] Office: [phone] Fax: [place]

## 2023-01-26 ENCOUNTER — Ambulatory Visit: Payer: Self-pay

## 2023-01-26 NOTE — Telephone Encounter (Signed)
  Chief Complaint: cough Symptoms: cough dry and chest soreness from cough Frequency: 1 week Pertinent Negatives: Patient denies SOB or fever Disposition: '[]'$ ED /'[]'$ Urgent Care (no appt availability in office) / '[]'$ Appointment(In office/virtual)/ '[]'$  [facility] [facility]/ '[]'$ Home Care/ '[]'$ Refused Recommended Disposition /'[]'$ [place] [facility]/ '[x]'$  Follow-up with PCP Additional Notes: pt had OV on 01/22/23 was given Benzonatate and has been taking that and Mucinex and no improvement. Pt is asking if something else can be prescribed. Advised her I would send message back to provider for review.   Summary: worsening Cough   Pts cough is not better and a bit worse/ medication prescribed and musinex hasn't worked / please advise         Reason for Disposition  [1] Continuous (nonstop) coughing interferes with work or school AND [2] no improvement using cough treatment per Care Advice  Answer Assessment - Initial Assessment Questions 1. ONSET: "When did the cough begin?"      1 week  3. SPUTUM: "Describe the color of your sputum" (none, dry cough; clear, white, yellow, green)     none 5. DIFFICULTY BREATHING: "Are you having difficulty breathing?" If Yes, ask: "How bad is it?" (e.g., mild, moderate, severe)    - MILD: No SOB at rest, mild SOB with walking, speaks normally in sentences, can lie down, no retractions, pulse < 100.    - MODERATE: SOB at rest, SOB with minimal exertion and prefers to sit, cannot lie down flat, speaks in phrases, mild retractions, audible wheezing, pulse 100-120.    - SEVERE: Very SOB at rest, speaks in single words, struggling to breathe, sitting hunched forward, retractions, pulse > 120      no 10. OTHER SYMPTOMS: "Do you have any other symptoms?" (e.g., runny nose, wheezing, chest pain)       Chest discomfort but no SOB  Protocols used: Cough - Acute Productive-A-AH

## 2023-01-27 ENCOUNTER — Other Ambulatory Visit: Payer: Self-pay | Admitting: Physician Assistant

## 2023-01-27 MED ORDER — AMOXICILLIN 875 MG PO TABS: 875.0000 mg | ORAL_TABLET | Freq: Two times a day (BID) | ORAL | 0 refills | Status: AC

## 2023-01-28 NOTE — Telephone Encounter (Signed)
Called pt she has picked up her medication.  KP

## 2023-04-26 ENCOUNTER — Encounter: Payer: Self-pay | Admitting: Family Medicine

## 2023-04-26 ENCOUNTER — Ambulatory Visit
Admission: RE | Admit: 2023-04-26 | Discharge: 2023-04-26 | Disposition: A | Discharge status: Home / Self Care | Payer: BC Managed Care – PPO | Attending: Family Medicine | Admitting: Family Medicine

## 2023-04-26 ENCOUNTER — Ambulatory Visit
Admission: RE | Admit: 2023-04-26 | Discharge: 2023-04-26 | Disposition: A | Discharge status: Home / Self Care | Payer: BC Managed Care – PPO | Source: Ambulatory Visit | Attending: Family Medicine | Admitting: Family Medicine

## 2023-04-26 ENCOUNTER — Ambulatory Visit (INDEPENDENT_AMBULATORY_CARE_PROVIDER_SITE_OTHER): Payer: BC Managed Care – PPO | Admitting: Family Medicine

## 2023-04-26 VITALS — BP 139/82 | HR 72 | Temp 97.7°F | Resp 12 | Wt 139.8 lb

## 2023-04-26 DIAGNOSIS — L237 Allergic contact dermatitis due to plants, except food: Secondary | ICD-10-CM | POA: Diagnosis not present

## 2023-04-26 DIAGNOSIS — M79644 Pain in right finger(s): Secondary | ICD-10-CM

## 2023-04-26 DIAGNOSIS — M7989 Other specified soft tissue disorders: Secondary | ICD-10-CM | POA: Diagnosis not present

## 2023-04-26 MED ORDER — PREDNISONE 10 MG PO TABS: ORAL_TABLET | ORAL | 0 refills | Status: DC

## 2023-04-26 NOTE — Progress Notes (Signed)
   Acute Office Visit  Subjective:     Patient ID: Natalie Dickerson, female    DOB: Oct 02, 1961, 62 y.o.   MRN: [id number]  Chief Complaint  Patient presents with   Rash    X 1 week on arms, abdomen and legs. Patient reports working on yard last weekend. Possible poison ivy or oak.    HPI Rash Patient is in today for several day history of pruritic rash with blisters on her arms bilaterally, upper thighs, lower abdomen, and right groin. Was gardening and thinks she was exposed to poison ivy and oak.   Finger injury Jammed her pinky finger and "heard something" when she did. Now finger is swollen at the proximal interphalangeal joint. Had previously broken that finger and had surgery on it.  Hardware from surgery was previously removed.   ROS  As stated in HPI     Objective:    BP 139/82 (BP Location: Left Arm, Patient Position: Sitting, Cuff Size: Normal)   Pulse 72   Temp 97.7 F (36.5 C) (Temporal)   Resp 12   Wt 139 lb 12.8 oz (63.4 kg)   BMI 24.76 kg/m     Physical Exam Constitutional:      General: She is not in acute distress. Musculoskeletal:     Comments: Swelling of the right 5th metacarpal at the proximal interphalangeal joint.   Skin:    General: Skin is warm and dry.     Capillary Refill: Capillary refill takes less than 2 seconds.     Findings: Rash present.     Comments: Erythematous rash present bilaterally on upper and lower arms with tense bullae and vesicles.    Neurological:     Mental Status: She is alert.     No results found for any visits on 04/26/23.     Assessment & Plan:   Problem List Items Addressed This Visit   None  1. Poison ivy dermatitis Symptoms, and physical exams most consistent with poison ivy dermatitis. Given extensive body surface coverage and presence of rash around the groin, start oral prednisone taper.   Meds ordered this encounter  Medications   predniSONE (DELTASONE) 10 MG tablet    Sig: Take 40mg  PO daily  x4d, then 30mg  daily x4d, then 20mg  daily x4d, then 10mg  daily x4 d, then 5mg  daily x4d    Dispense:  42 tablet    Refill:  0     2. Finger pain, right Due to swelling on physical exam and prior medical history of surgical repair of the finger, ordered xray of the right hand.  Consider referral to orthopedics if X-ray shows evidence of fracture.  - DG Hand Complete Right; Future  No orders of the defined types were placed in this encounter.   No follow-ups on file.  Gilmer Mor, Medical Student   Patient seen along with MS3 student Ezekiel Slocumb. I personally evaluated this patient along with the student, and verified all aspects of the history, physical exam, and medical decision making as documented by the student. I agree with the student's documentation and have made all necessary edits.  Rhondalyn Clingan, Marzella Schlein, MD, MPH [facility]

## 2023-04-30 ENCOUNTER — Telehealth: Payer: Self-pay

## 2023-04-30 DIAGNOSIS — M79644 Pain in right finger(s): Secondary | ICD-10-CM | POA: Diagnosis not present

## 2023-04-30 NOTE — Telephone Encounter (Signed)
Patient will go to [facility] as walk-in. She will try to pick up copy of x-ray from imaging center.  Copied from CRM [id number]. Topic: General - Inquiry >> Apr 30, 2023 12:21 PM Marlow Baars wrote: Reason for CRM: The patient called in wanting to speak with someone about her x-ray results on her fractured finger. Please assist patient as soon as possible. She wants to discuss [facility] >> Apr 30, 2023 12:23 PM Marlow Baars wrote: The patient states if she does not hear from someone by 2 she will probably stop by and call with someone at the office

## 2023-05-06 ENCOUNTER — Ambulatory Visit (INDEPENDENT_AMBULATORY_CARE_PROVIDER_SITE_OTHER): Payer: BC Managed Care – PPO | Admitting: Dermatology

## 2023-05-06 VITALS — BP 114/78 | HR 71

## 2023-05-06 DIAGNOSIS — W908XXA Exposure to other nonionizing radiation, initial encounter: Secondary | ICD-10-CM | POA: Diagnosis not present

## 2023-05-06 DIAGNOSIS — X32XXXA Exposure to sunlight, initial encounter: Secondary | ICD-10-CM

## 2023-05-06 DIAGNOSIS — L237 Allergic contact dermatitis due to plants, except food: Secondary | ICD-10-CM

## 2023-05-06 DIAGNOSIS — L814 Other melanin hyperpigmentation: Secondary | ICD-10-CM | POA: Diagnosis not present

## 2023-05-06 DIAGNOSIS — L570 Actinic keratosis: Secondary | ICD-10-CM

## 2023-05-06 DIAGNOSIS — Z1283 Encounter for screening for malignant neoplasm of skin: Secondary | ICD-10-CM | POA: Diagnosis not present

## 2023-05-06 DIAGNOSIS — D229 Melanocytic nevi, unspecified: Secondary | ICD-10-CM

## 2023-05-06 DIAGNOSIS — Z85828 Personal history of other malignant neoplasm of skin: Secondary | ICD-10-CM

## 2023-05-06 DIAGNOSIS — D1801 Hemangioma of skin and subcutaneous tissue: Secondary | ICD-10-CM

## 2023-05-06 DIAGNOSIS — L821 Other seborrheic keratosis: Secondary | ICD-10-CM | POA: Diagnosis not present

## 2023-05-06 DIAGNOSIS — Z7189 Other specified counseling: Secondary | ICD-10-CM

## 2023-05-06 DIAGNOSIS — L578 Other skin changes due to chronic exposure to nonionizing radiation: Secondary | ICD-10-CM

## 2023-05-06 DIAGNOSIS — Z872 Personal history of diseases of the skin and subcutaneous tissue: Secondary | ICD-10-CM

## 2023-05-06 MED ORDER — STRATA TRIZ EX GEL
CUTANEOUS | 3 refills | Status: DC
Start: 2023-05-06 — End: 2023-05-18

## 2023-05-06 NOTE — Progress Notes (Signed)
Follow-Up Visit   Subjective  Natalie Dickerson is a 62 y.o. female who presents for the following: Skin Cancer Screening and Full Body Skin Exam, hx of BCC, hx of Aks  Patient c/o poison ivy rash on her arms currently taking a 2 week dose of Prednisone, patient would like something to help with scarring from rash on her arms.     The patient presents for Total-Body Skin Exam (TBSE) for skin cancer screening and mole check. The patient has spots, moles and lesions to be evaluated, some may be new or changing and the patient has concerns that these could be cancer.    The following portions of the chart were reviewed this encounter and updated as appropriate: medications, allergies, medical history  Review of Systems:  No other skin or systemic complaints except as noted in HPI or Assessment and Plan.  Objective  Well appearing patient in no apparent distress; mood and affect are within normal limits.  A full examination was performed including scalp, head, eyes, ears, nose, lips, neck, chest, axillae, abdomen, back, buttocks, bilateral upper extremities, bilateral lower extremities, hands, feet, fingers, toes, fingernails, and toenails. All findings within normal limits unless otherwise noted below.   Relevant physical exam findings are noted in the Assessment and Plan.  face, right hand x 7 (7) Erythematous thin papules/macules with gritty scale.     Assessment & Plan   LENTIGINES, SEBORRHEIC KERATOSES, HEMANGIOMAS - Benign normal skin lesions - Benign-appearing - Call for any changes  MELANOCYTIC NEVI - Tan-brown and/or pink-flesh-colored symmetric macules and papules - Benign appearing on exam today - Observation - Call clinic for new or changing moles - Recommend daily use of broad spectrum spf 30+ sunscreen to sun-exposed areas.   ACTINIC DAMAGE - Chronic condition, secondary to cumulative UV/sun exposure - diffuse scaly erythematous macules with underlying  dyspigmentation - Recommend daily broad spectrum sunscreen SPF 30+ to sun-exposed areas, reapply every 2 hours as needed.  - Staying in the shade or wearing long sleeves, sun glasses (UVA+UVB protection) and wide brim hats (4-inch brim around the entire circumference of the hat) are also recommended for sun protection.  - Call for new or changing lesions.  DERMATITIS/PLANT POISON IVY  Arms  Continue Prednisone as directed  Can try otc Mederma gel  or Serica for scar   Start Starta Triz scar gel qd-bid   AK (actinic keratosis) (7) face, right hand x 7  Actinic keratoses are precancerous spots that appear secondary to cumulative UV radiation exposure/sun exposure over time. They are chronic with expected duration over 1 year. A portion of actinic keratoses will progress to squamous cell carcinoma of the skin. It is not possible to reliably predict which spots will progress to skin cancer and so treatment is recommended to prevent development of skin cancer.  Recommend daily broad spectrum sunscreen SPF 30+ to sun-exposed areas, reapply every 2 hours as needed.  Recommend staying in the shade or wearing long sleeves, sun glasses (UVA+UVB protection) and wide brim hats (4-inch brim around the entire circumference of the hat). Call for new or changing lesions.   Destruction of lesion - face, right hand x 7 Complexity: simple   Destruction method: cryotherapy   Informed consent: discussed and consent obtained   Timeout:  patient name, date of birth, surgical site, and procedure verified Lesion destroyed using liquid nitrogen: Yes   Region frozen until ice ball extended beyond lesion: Yes   Outcome: patient tolerated procedure well with no  complications   Post-procedure details: wound care instructions given    Related Medications fluorouracil (EFUDEX) 5 % cream Apply topically 2 (two) times daily. Twice daily as directed   HISTORY OF BASAL CELL CARCINOMA OF THE SKIN Left superior  forehead  - No evidence of recurrence today - Recommend regular full body skin exams - Recommend daily broad spectrum sunscreen SPF 30+ to sun-exposed areas, reapply every 2 hours as needed.  - Call if any new or changing lesions are noted between office visits    SKIN CANCER SCREENING PERFORMED TODAY.  Return in about 6 months (around 11/06/2023) for AKs .  IAngelique Holm, CMA, am acting as scribe for Armida Sans, MD .   Documentation: I have reviewed the above documentation for accuracy and completeness, and I agree with the above.  Armida Sans, MD

## 2023-05-06 NOTE — Patient Instructions (Addendum)
Mederma gel  or Serica for scar      Cryotherapy Aftercare  Wash gently with soap and water everyday.   Apply Vaseline and Band-Aid daily until healed.      Recommend daily broad spectrum sunscreen SPF 30+ to sun-exposed areas, reapply every 2 hours as needed. Call for new or changing lesions.  Staying in the shade or wearing long sleeves, sun glasses (UVA+UVB protection) and wide brim hats (4-inch brim around the entire circumference of the hat) are also recommended for sun protection.      Due to recent changes in healthcare laws, you may see results of your pathology and/or laboratory studies on MyChart before the doctors have had a chance to review them. We understand that in some cases there may be results that are confusing or concerning to you. Please understand that not all results are received at the same time and often the doctors may need to interpret multiple results in order to provide you with the best plan of care or course of treatment. Therefore, we ask that you please give [place] 2 business days to thoroughly review all your results before contacting the office for clarification. Should we see a critical lab result, you will be contacted sooner.   If You Need Anything After Your Visit  If you have any questions or concerns for your doctor, please call our main line at [phone] and press option 4 to reach your doctor's medical assistant. If no one answers, please leave a voicemail as directed and we will return your call as soon as possible. Messages left after 4 pm will be answered the following business day.   You may also send [place] a message via MyChart. We typically respond to MyChart messages within 1-2 business days.  For prescription refills, please ask your pharmacy to contact our office. Our fax number is [phone].  If you have an urgent issue when the clinic is closed that cannot wait until the next business day, you can page your doctor at the number below.     Please note that while we do our best to be available for urgent issues outside of office hours, we are not available 24/7.   If you have an urgent issue and are unable to reach [place], you may choose to seek medical care at your doctor's office, retail clinic, urgent care center, or emergency room.  If you have a medical emergency, please immediately call 911 or go to the emergency department.  Pager Numbers  - Dr. Gwen Pounds: [phone]  - Dr. Neale Burly: [phone]  - Dr. Roseanne Reno: [phone]  In the event of inclement weather, please call our main line at [phone] for an update on the status of any delays or closures.  Dermatology Medication Tips: Please keep the boxes that topical medications come in in order to help keep track of the instructions about where and how to use these. Pharmacies typically print the medication instructions only on the boxes and not directly on the medication tubes.   If your medication is too expensive, please contact our office at [phone] option 4 or send [place] a message through MyChart.   We are unable to tell what your co-pay for medications will be in advance as this is different depending on your insurance coverage. However, we may be able to find a substitute medication at lower cost or fill out paperwork to get insurance to cover a needed medication.   If a prior authorization is required to get your medication  covered by your insurance company, please allow [place] 1-2 business days to complete this process.  Drug prices often vary depending on where the prescription is filled and some pharmacies may offer cheaper prices.  The website www.goodrx.com contains coupons for medications through different pharmacies. The prices here do not account for what the cost may be with help from insurance (it may be cheaper with your insurance), but the website can give you the price if you did not use any insurance.  - You can print the associated coupon and take  it with your prescription to the pharmacy.  - You may also stop by our office during regular business hours and pick up a GoodRx coupon card.  - If you need your prescription sent electronically to a different pharmacy, notify our office through [facility] or by phone at [phone] option 4.     Si Usted Necesita Algo Despus de Su Visita  Tambin puede enviarnos un mensaje a travs de Clinical cytogeneticist. Por lo general respondemos a los mensajes de MyChart en el transcurso de 1 a 2 das hbiles.  Para renovar recetas, por favor pida a su farmacia que se ponga en contacto con nuestra oficina. Annie Sable de fax es [place] [phone].  Si tiene un asunto urgente cuando la clnica est cerrada y que no puede esperar hasta el siguiente da hbil, puede llamar/localizar a su doctor(a) al nmero que aparece a continuacin.   Por favor, tenga en cuenta que aunque hacemos todo lo posible para estar disponibles para asuntos urgentes fuera del horario de [place], no estamos disponibles las 24 horas del da, los 7 [address]  [address] de la [place].   Si tiene un problema urgente y no puede comunicarse con nosotros, puede optar por buscar atencin mdica  en el consultorio de su doctor(a), en una clnica privada, en un centro de atencin urgente o en una sala de emergencias.  Si tiene Engineer, drilling, por favor llame inmediatamente al 911 o vaya a la sala de emergencias.  Nmeros de bper  - Dr. Gwen Pounds: [phone]  - Dra. Moye: [phone]  - Dra. Roseanne Reno: [phone]  En caso de inclemencias del [place], por favor llame a Lacy Duverney principal al [phone] para una actualizacin sobre el [place] de cualquier retraso o cierre.  Consejos para la medicacin en dermatologa: Por favor, guarde las cajas en las que vienen los medicamentos de uso tpico para ayudarle a seguir las instrucciones sobre dnde y cmo usarlos. Las farmacias generalmente imprimen las instrucciones del medicamento slo en las  cajas y no directamente en los tubos del [place].   Si su medicamento es muy caro, por favor, pngase en contacto con Rolm Gala llamando al [phone] y presione la opcin 4 o envenos un mensaje a travs de Clinical cytogeneticist.   No podemos decirle cul ser su copago por los medicamentos por adelantado ya que esto es diferente dependiendo de la cobertura de su seguro. Sin embargo, es posible que podamos encontrar un medicamento sustituto a Audiological scientist un formulario para que el seguro cubra el medicamento que se considera necesario.   Si se requiere una autorizacin previa para que su compaa de seguros [place] su medicamento, por favor permtanos de 1 a 2 das hbiles para completar [address]  Los precios de los medicamentos varan con frecuencia dependiendo del Environmental consultant de dnde se surte la receta y alguna farmacias pueden ofrecer precios ms baratos.  El sitio web www.goodrx.com tiene cupones para medicamentos de Health and safety inspector. Los precios aqu  no tienen en cuenta lo que podra costar con la ayuda del seguro (puede ser ms barato con su seguro), pero el sitio web puede darle el precio si no Field seismologist.  - Puede imprimir el cupn correspondiente y llevarlo con su receta a la farmacia.  - Tambin puede pasar por nuestra oficina durante el horario de atencin regular y Charity fundraiser una tarjeta de cupones de GoodRx.  - Si necesita que su receta se enve electrnicamente a una farmacia diferente, informe a nuestra oficina a travs de MyChart de [place] o por telfono llamando al [phone] y presione la opcin 4.

## 2023-05-13 ENCOUNTER — Telehealth (INDEPENDENT_AMBULATORY_CARE_PROVIDER_SITE_OTHER): Payer: Self-pay

## 2023-05-13 NOTE — Telephone Encounter (Signed)
Creatnine is largely the measure that we evaluate when looking for renal function as well as GFR.  Both of those are drawn within a BMP.  This is usually routinely drawn for most people at their annual follow up.  The kidney function levels will largely be followed by your PCP and we can use that in conjunction with the ultrasound that you will have done in January.

## 2023-05-14 NOTE — Telephone Encounter (Signed)
Called pt and made her aware she states verbal understanding

## 2023-05-15 ENCOUNTER — Encounter: Payer: Self-pay | Admitting: Dermatology

## 2023-05-18 ENCOUNTER — Encounter: Payer: Self-pay | Admitting: Family Medicine

## 2023-05-18 ENCOUNTER — Ambulatory Visit (INDEPENDENT_AMBULATORY_CARE_PROVIDER_SITE_OTHER): Payer: BC Managed Care – PPO | Admitting: Family Medicine

## 2023-05-18 VITALS — BP 110/74 | HR 72 | Temp 97.6°F | Resp 12 | Ht 63.0 in | Wt 138.7 lb

## 2023-05-18 DIAGNOSIS — Z0000 Encounter for general adult medical examination without abnormal findings: Secondary | ICD-10-CM

## 2023-05-18 DIAGNOSIS — I150 Renovascular hypertension: Secondary | ICD-10-CM

## 2023-05-18 DIAGNOSIS — E559 Vitamin D deficiency, unspecified: Secondary | ICD-10-CM

## 2023-05-18 DIAGNOSIS — E538 Deficiency of other specified B group vitamins: Secondary | ICD-10-CM | POA: Diagnosis not present

## 2023-05-18 DIAGNOSIS — R7303 Prediabetes: Secondary | ICD-10-CM | POA: Diagnosis not present

## 2023-05-18 MED ORDER — LOSARTAN POTASSIUM 25 MG PO TABS: 25.0000 mg | ORAL_TABLET | Freq: Every day | ORAL | 3 refills | Status: DC

## 2023-05-18 NOTE — Assessment & Plan Note (Signed)
Continue supplement Recheck level 

## 2023-05-18 NOTE — Assessment & Plan Note (Signed)
Well controlled Continue current medications Recheck metabolic panel F/u in 6 months  

## 2023-05-18 NOTE — Progress Notes (Signed)
I,Sulibeya S Dimas,acting as a Neurosurgeon for Shirlee Latch, MD.,have documented all relevant documentation on the behalf of Shirlee Latch, MD,as directed by  Shirlee Latch, MD while in the presence of Shirlee Latch, MD.    Complete physical exam   [name]   DOB: [birth year]   62 y.o. Female  MRN: [id number] Visit Date: 05/18/2023  Today's healthcare provider: Shirlee Latch, MD   Chief Complaint  Patient presents with   Annual Exam   Subjective    Natalie Dickerson is a 62 y.o. female who presents today for a complete physical exam.   She reports consuming a general diet. The patient does not participate in regular exercise at present. She generally feels well. She reports sleeping well. She does not have additional problems to discuss today.   HPI    Past Medical History:  Diagnosis Date   Actinic keratosis 05/01/2014   Left superior forehead   Cancer (HCC)    skin ca   Hx of basal cell carcinoma 12/06/2013   Left sup forehead   Past Surgical History:  Procedure Laterality Date   COLONOSCOPY WITH PROPOFOL N/A 08/07/2022   Procedure: COLONOSCOPY WITH PROPOFOL;  Surgeon: Midge Minium, MD;  Location: [facility] ENDOSCOPY;  Service: Endoscopy;  Laterality: N/A;   EMBOLIZATION N/A 03/24/2017   Procedure: Embolization;  Surgeon: Annice Needy, MD;  Location: [facility] INVASIVE CV LAB;  Service: Cardiovascular;  Laterality: N/A;   FINGER SURGERY     PERIPHERAL VASCULAR CATHETERIZATION N/A 01/18/2017   Procedure: Renal Angiography;  Surgeon: Annice Needy, MD;  Location: [facility] INVASIVE CV LAB;  Service: Cardiovascular;  Laterality: N/A;   PERIPHERAL VASCULAR CATHETERIZATION  01/18/2017   Procedure: Renal Intervention;  Surgeon: Annice Needy, MD;  Location: [facility] INVASIVE CV LAB;  Service: Cardiovascular;;   RENAL ANGIOGRAPHY N/A 03/24/2017   Procedure: Renal Angiography;  Surgeon: Annice Needy, MD;  Location: [facility] INVASIVE CV LAB;  Service: Cardiovascular;  Laterality:  N/A;   Social History   Socioeconomic History   Marital status: Married    Spouse name: Not on file   Number of children: 2   Years of education: Not on file   Highest education level: Not on file  Occupational History   Not on file  Tobacco Use   Smoking status: Former    Years: 5    Types: Cigarettes   Smokeless tobacco: Never   Tobacco comments:    Smoked for about 5 years, smoked about 1 pack per day. Quit in 1984  Vaping Use   Vaping Use: Never used  Substance and Sexual Activity   Alcohol use: Yes    Comment: Occasional alcohol use; Drinks 2-4 glasses of wine per week.   Drug use: No   Sexual activity: Not on file  Other Topics Concern   Not on file  Social History Narrative   Not on file   Social Determinants of Health   Financial Resource Strain: Not on file  Food Insecurity: Not on file  Transportation Needs: Not on file  Physical Activity: Not on file  Stress: Not on file  Social Connections: Not on file  Intimate Partner Violence: Not on file   Family Status  Relation Name Status   Mother  Alive   Father  Deceased at age 51       died from breast cancer    MGM  Deceased   MGF  Deceased   PGM  Deceased   PGF  Deceased  Sister  Nature conservation officer   Brother  Alive   Son  Alive   Son  Alive   Family History  Problem Relation Age of Onset   Hypertension Mother    Breast cancer Mother 54   Cancer Mother        breast cancer   Breast cancer Father 44   Hypertension Brother    Allergies  Allergen Reactions   Bee Venom Anaphylaxis   Ivp Dye [Iodinated Contrast Media] Rash    Patient Care Team: Erasmo Downer, MD as PCP - General (Family Medicine)   Medications: Outpatient Medications Prior to Visit  Medication Sig   aspirin EC 81 MG EC tablet Take 1 tablet (81 mg total) by mouth daily.   cholecalciferol (VITAMIN D) 1000 units tablet Take 1,000 Units by mouth daily.   fluorouracil (EFUDEX) 5 % cream Apply topically 2 (two)  times daily. Twice daily as directed   Multiple Vitamins-Minerals (MULTIVITAMIN WITH MINERALS) tablet Take 1 tablet by mouth daily.   [DISCONTINUED] losartan (COZAAR) 25 MG tablet Take 1 tablet (25 mg total) by mouth daily.   [DISCONTINUED] predniSONE (DELTASONE) 10 MG tablet Take 40mg  PO daily x4d, then 30mg  daily x4d, then 20mg  daily x4d, then 10mg  daily x4 d, then 5mg  daily x4d   [DISCONTINUED] Scar Treatment Products (STRATA TRIZ) GEL Apply to scar qd-bid   No facility-administered medications prior to visit.    Review of Systems  HENT:  Positive for tinnitus.   Musculoskeletal:  Positive for neck pain.  All other systems reviewed and are negative.   Last CBC Lab Results  Component Value Date   WBC 6.0 12/26/2020   HGB 14.4 12/26/2020   HCT 42.8 12/26/2020   MCV 90 12/26/2020   MCH 30.4 12/26/2020   RDW 11.7 12/26/2020   PLT 342 12/26/2020   Last metabolic panel Lab Results  Component Value Date   GLUCOSE 85 11/30/2022   NA 143 11/30/2022   K 4.1 11/30/2022   CL 106 11/30/2022   CO2 23 11/30/2022   BUN 13 11/30/2022   CREATININE 0.89 11/30/2022   EGFR 75 11/30/2022   CALCIUM 9.5 11/30/2022   PROT 7.3 05/12/2022   ALBUMIN 4.6 05/12/2022   LABGLOB 2.7 05/12/2022   AGRATIO 1.7 05/12/2022   BILITOT 0.5 05/12/2022   ALKPHOS 89 05/12/2022   AST 22 05/12/2022   ALT 18 05/12/2022   ANIONGAP 7 03/24/2017   Last lipids Lab Results  Component Value Date   CHOL 192 05/12/2022   HDL 81 05/12/2022   LDLCALC 94 05/12/2022   TRIG 95 05/12/2022   CHOLHDL 2.5 12/26/2020   Last hemoglobin A1c Lab Results  Component Value Date   HGBA1C 5.5 11/30/2022   Last thyroid functions Lab Results  Component Value Date   TSH 1.190 12/26/2020   Last vitamin D Lab Results  Component Value Date   VD25OH 56.4 05/12/2022   Last vitamin B12 and Folate Lab Results  Component Value Date   VITAMINB12 748 05/12/2022      Objective    BP 110/74 (BP Location: Left Arm,  Patient Position: Sitting, Cuff Size: Normal)   Pulse 72   Temp 97.6 F (36.4 C) (Temporal)   Resp 12   Ht 5\' 3"  (1.6 m)   Wt 138 lb 11.2 oz (62.9 kg)   BMI 24.57 kg/m  BP Readings from Last 3 Encounters:  05/18/23 110/74  05/06/23 114/78  04/26/23 139/82   Wt Readings from  Last 3 Encounters:  05/18/23 138 lb 11.2 oz (62.9 kg)  04/26/23 139 lb 12.8 oz (63.4 kg)  01/22/23 144 lb 6.4 oz (65.5 kg)       Physical Exam Vitals reviewed.  Constitutional:      General: She is not in acute distress.    Appearance: Normal appearance. She is well-developed. She is not diaphoretic.  HENT:     Head: Normocephalic and atraumatic.     Right Ear: Tympanic membrane, ear canal and external ear normal.     Left Ear: Tympanic membrane, ear canal and external ear normal.     Nose: Nose normal.     Mouth/Throat:     Mouth: Mucous membranes are moist.     Pharynx: Oropharynx is clear. No oropharyngeal exudate.  Eyes:     General: No scleral icterus.    Conjunctiva/sclera: Conjunctivae normal.     Pupils: Pupils are equal, round, and reactive to light.  Neck:     Thyroid: No thyromegaly.  Cardiovascular:     Rate and Rhythm: Normal rate and regular rhythm.     Pulses: Normal pulses.     Heart sounds: Normal heart sounds. No murmur heard. Pulmonary:     Effort: Pulmonary effort is normal. No respiratory distress.     Breath sounds: Normal breath sounds. No wheezing or rales.  Abdominal:     General: There is no distension.     Palpations: Abdomen is soft.     Tenderness: There is no abdominal tenderness.  Musculoskeletal:        General: No deformity.     Cervical back: Neck supple.     Right lower leg: No edema.     Left lower leg: No edema.     Comments: Tightness in SCM and upper traps b/l  Lymphadenopathy:     Cervical: No cervical adenopathy.  Skin:    General: Skin is warm and dry.     Findings: No rash.  Neurological:     Mental Status: She is alert and oriented to  person, place, and time. Mental status is at baseline.     Gait: Gait normal.  Psychiatric:        Mood and Affect: Mood normal.        Behavior: Behavior normal.        Thought Content: Thought content normal.       Last depression screening scores    05/18/2023   10:26 AM 01/22/2023    3:01 PM 11/30/2022    3:28 PM  PHQ 2/9 Scores  PHQ - 2 Score 0 0 0  PHQ- 9 Score 1 0 2   Last fall risk screening    05/18/2023   10:25 AM  Fall Risk   Falls in the past year? 0  Number falls in past yr: 0  Injury with Fall? 0  Risk for fall due to : No Fall Risks  Follow up Falls evaluation completed   Last Audit-C alcohol use screening    05/18/2023   10:26 AM  Alcohol Use Disorder Test (AUDIT)  1. How often do you have a drink containing alcohol? 2  2. How many drinks containing alcohol do you have on a typical day when you are drinking? 0  3. How often do you have six or more drinks on one occasion? 0  AUDIT-C Score 2   A score of 3 or more in women, and 4 or more in men indicates increased risk for alcohol abuse,  EXCEPT if all of the points are from question 1   No results found for any visits on 05/18/23.  Assessment & Plan    Routine Health Maintenance and Physical Exam  Exercise Activities and Dietary recommendations  Goals   None     Immunization History  Administered Date(s) Administered   Td 07/13/2016   Tdap 08/16/2008    Health Maintenance  Topic Date Due   COVID-19 Vaccine (1) Never done   Zoster Vaccines- Shingrix (1 of 2) Never done   INFLUENZA VACCINE  07/29/2023   MAMMOGRAM  03/10/2024   DTaP/Tdap/Td (3 - Td or Tdap) 07/13/2026   PAP SMEAR-Modifier  05/13/2027   COLONOSCOPY (Pts 45-14yrs Insurance coverage will need to be confirmed)  08/07/2029   Hepatitis C Screening  Completed   HIV Screening  Completed   HPV VACCINES  Aged Out    Discussed health benefits of physical activity, and encouraged her to engage in regular exercise appropriate for her  age and condition.  Problem List Items Addressed This Visit       Cardiovascular and Mediastinum   Renovascular hypertension    Well controlled Continue current medications Recheck metabolic panel F/u in 6 months       Relevant Medications   losartan (COZAAR) 25 MG tablet   Other Relevant Orders   Comprehensive metabolic panel   Lipid panel     Other   Avitaminosis D    Continue supplement Recheck level       Relevant Orders   VITAMIN D 25 Hydroxy (Vit-D Deficiency, Fractures)   B12 deficiency    Continue supplement Recheck level       Relevant Orders   B12   Prediabetes    Recommend low carb diet Recheck A1c       Relevant Orders   Hemoglobin A1c   Other Visit Diagnoses     Encounter for annual physical exam    -  Primary   Relevant Orders   Comprehensive metabolic panel   Lipid panel   Hemoglobin A1c   B12   VITAMIN D 25 Hydroxy (Vit-D Deficiency, Fractures)        Return in about 6 months (around 11/18/2023) for chronic disease f/u.     I, Shirlee Latch, MD, have reviewed all documentation for this visit. The documentation on 05/18/23 for the exam, diagnosis, procedures, and orders are all accurate and complete.   Chalese Peach, Marzella Schlein, MD, MPH [facility]

## 2023-05-18 NOTE — Assessment & Plan Note (Signed)
Recommend low carb diet °Recheck A1c  °

## 2023-05-19 LAB — LIPID PANEL
Chol/HDL Ratio: 2.9 ratio (ref 0.0–4.4)
Cholesterol, Total: 207 mg/dL — ABNORMAL HIGH (ref 100–199)
HDL: 72 mg/dL (ref >39)
LDL Chol Calc (NIH): 117 mg/dL — ABNORMAL HIGH (ref 0–99)
Triglycerides: 102 mg/dL (ref 0–149)
VLDL Cholesterol Cal: 18 mg/dL (ref 5–40)

## 2023-05-19 LAB — COMPREHENSIVE METABOLIC PANEL
ALT: 16 IU/L (ref 0–32)
AST: 21 IU/L (ref 0–40)
Albumin/Globulin Ratio: 1.6 (ref 1.2–2.2)
Albumin: 4.1 g/dL (ref 3.8–4.9)
Alkaline Phosphatase: 82 IU/L (ref 44–121)
BUN/Creatinine Ratio: 18 (ref 12–28)
BUN: 16 mg/dL (ref 8–27)
Bilirubin Total: 0.5 mg/dL (ref 0.0–1.2)
CO2: 22 mmol/L (ref 20–29)
Calcium: 9.2 mg/dL (ref 8.7–10.3)
Chloride: 105 mmol/L (ref 96–106)
Creatinine, Ser: 0.88 mg/dL (ref 0.57–1.00)
Globulin, Total: 2.6 g/dL (ref 1.5–4.5)
Glucose: 86 mg/dL (ref 70–99)
Potassium: 4.3 mmol/L (ref 3.5–5.2)
Sodium: 141 mmol/L (ref 134–144)
Total Protein: 6.7 g/dL (ref 6.0–8.5)
eGFR: 75 mL/min/{1.73_m2} (ref >59)

## 2023-05-19 LAB — VITAMIN D 25 HYDROXY (VIT D DEFICIENCY, FRACTURES): Vit D, 25-Hydroxy: 44.3 ng/mL (ref 30.0–100.0)

## 2023-05-19 LAB — SPECIMEN STATUS REPORT

## 2023-05-19 LAB — VITAMIN B12: Vitamin B-12: 630 pg/mL (ref 232–1245)

## 2023-05-19 LAB — HEMOGLOBIN A1C
Est. average glucose Bld gHb Est-mCnc: 123 mg/dL
Hgb A1c MFr Bld: 5.9 % — ABNORMAL HIGH (ref 4.8–5.6)

## 2023-06-03 ENCOUNTER — Encounter: Payer: BC Managed Care – PPO | Admitting: Family Medicine

## 2023-06-11 ENCOUNTER — Other Ambulatory Visit: Payer: Self-pay | Admitting: Family Medicine

## 2023-06-11 ENCOUNTER — Encounter: Payer: Self-pay | Admitting: Family Medicine

## 2023-06-11 DIAGNOSIS — Z1231 Encounter for screening mammogram for malignant neoplasm of breast: Secondary | ICD-10-CM

## 2023-06-24 ENCOUNTER — Ambulatory Visit
Admission: RE | Admit: 2023-06-24 | Discharge: 2023-06-24 | Disposition: A | Discharge status: Home / Self Care | Payer: BC Managed Care – PPO | Source: Ambulatory Visit | Attending: Family Medicine | Admitting: Family Medicine

## 2023-06-24 ENCOUNTER — Telehealth: Payer: Self-pay

## 2023-06-24 DIAGNOSIS — Z1231 Encounter for screening mammogram for malignant neoplasm of breast: Secondary | ICD-10-CM | POA: Diagnosis not present

## 2023-06-24 NOTE — Telephone Encounter (Signed)
Copied from CRM [id number]. Topic: General - Inquiry >> Jun 24, 2023  8:24 AM Patsy Lager T wrote: Reason for CRM: patient has an appt today at 3:40 and is wanting provider to resend the orders to [facility] as they are saying they never recvd them.

## 2023-06-25 NOTE — Telephone Encounter (Signed)
Looks like she already got her mammo yesterday and I can see orders in the chart

## 2023-11-08 ENCOUNTER — Ambulatory Visit: Payer: BC Managed Care – PPO | Admitting: Dermatology

## 2023-11-08 DIAGNOSIS — Z7189 Other specified counseling: Secondary | ICD-10-CM

## 2023-11-08 DIAGNOSIS — L821 Other seborrheic keratosis: Secondary | ICD-10-CM | POA: Diagnosis not present

## 2023-11-08 DIAGNOSIS — Z5111 Encounter for antineoplastic chemotherapy: Secondary | ICD-10-CM

## 2023-11-08 DIAGNOSIS — L570 Actinic keratosis: Secondary | ICD-10-CM

## 2023-11-08 DIAGNOSIS — L578 Other skin changes due to chronic exposure to nonionizing radiation: Secondary | ICD-10-CM | POA: Diagnosis not present

## 2023-11-08 DIAGNOSIS — W908XXA Exposure to other nonionizing radiation, initial encounter: Secondary | ICD-10-CM

## 2023-11-08 DIAGNOSIS — L820 Inflamed seborrheic keratosis: Secondary | ICD-10-CM

## 2023-11-08 DIAGNOSIS — Z79899 Other long term (current) drug therapy: Secondary | ICD-10-CM

## 2023-11-08 MED ORDER — FLUOROURACIL 5 % EX CREA
TOPICAL_CREAM | CUTANEOUS | 2 refills | Status: DC
Start: 2023-11-08 — End: 2024-04-04

## 2023-11-08 NOTE — Patient Instructions (Signed)

## 2023-11-08 NOTE — Progress Notes (Signed)
Follow-Up Visit   Subjective  Natalie Dickerson is a 62 y.o. female who presents for the following: Actinic keratosis of the face and hands, 6 month follow-up. Patient has treated with fluorouracil/calcipotriene cream in the past, but she prefers the 5FU only. She also has itchy spots on her back that she would like checked. Also, brown spots on her legs that she would like treated, asymptomatic.   The patient has spots, moles and lesions to be evaluated, some may be new or changing and the patient may have concern these could be cancer.    The following portions of the chart were reviewed this encounter and updated as appropriate: medications, allergies, medical history  Review of Systems:  No other skin or systemic complaints except as noted in HPI or Assessment and Plan.  Objective  Well appearing patient in no apparent distress; mood and affect are within normal limits.  A focused examination was performed of the following areas: Face, hands, back, legs  Relevant exam findings are noted in the Assessment and Plan.  R proximal wrist x 1, L of midline middle forehead x 1, R sup forehead x 1, R inf cheek near chin x 1, R proximal cheek x 1, L temple x 1, nasal bridge x 1, L nasal alar rim x 1 (8) Erythematous thin papules/macules with gritty scale.   back x 3 (3) Erythematous stuck-on, waxy papule or plaque  R pretibial x 4 (4) Stuck-on, waxy, tan-brown papule or plaque --Discussed benign etiology and prognosis.     Assessment & Plan   AK (actinic keratosis) (8) R proximal wrist x 1, L of midline middle forehead x 1, R sup forehead x 1, R inf cheek near chin x 1, R proximal cheek x 1, L temple x 1, nasal bridge x 1, L nasal alar rim x 1  Actinic keratoses are precancerous spots that appear secondary to cumulative UV radiation exposure/sun exposure over time. They are chronic with expected duration over 1 year. A portion of actinic keratoses will progress to squamous cell  carcinoma of the skin. It is not possible to reliably predict which spots will progress to skin cancer and so treatment is recommended to prevent development of skin cancer.  Recommend daily broad spectrum sunscreen SPF 30+ to sun-exposed areas, reapply every 2 hours as needed.  Recommend staying in the shade or wearing long sleeves, sun glasses (UVA+UVB protection) and wide brim hats (4-inch brim around the entire circumference of the hat). Call for new or changing lesions.  Destruction of lesion - R proximal wrist x 1, L of midline middle forehead x 1, R sup forehead x 1, R inf cheek near chin x 1, R proximal cheek x 1, L temple x 1, nasal bridge x 1, L nasal alar rim x 1 (8) Complexity: simple   Destruction method: cryotherapy   Informed consent: discussed and consent obtained   Timeout:  patient name, date of birth, surgical site, and procedure verified Lesion destroyed using liquid nitrogen: Yes   Region frozen until ice ball extended beyond lesion: Yes   Outcome: patient tolerated procedure well with no complications   Post-procedure details: wound care instructions given    Related Medications fluorouracil (EFUDEX) 5 % cream Apply to affected areas face once to twice daily x 4 weeks.  Inflamed seborrheic keratosis (3) back x 3  Symptomatic, irritating, patient would like treated.  Destruction of lesion - back x 3 (3) Complexity: simple   Destruction method: cryotherapy  Informed consent: discussed and consent obtained   Timeout:  patient name, date of birth, surgical site, and procedure verified Lesion destroyed using liquid nitrogen: Yes   Region frozen until ice ball extended beyond lesion: Yes   Outcome: patient tolerated procedure well with no complications   Post-procedure details: wound care instructions given    Seborrheic keratosis (4) R pretibial x 4  Cosmetic, patient desires treatment.  Discussed cosmetic procedure, noncovered.  $60 for 1st lesion and $15  for each additional lesion if done on the same day.  Maximum charge $350.  One touch-up treatment included no charge. Discussed risks of treatment including dyspigmentation, small scar, and/or recurrence. Recommend daily broad spectrum sunscreen SPF 30+/photoprotection to treated areas once healed.   Destruction of lesion - R pretibial x 4 (4) Complexity: simple   Destruction method: cryotherapy   Informed consent: discussed and consent obtained   Timeout:  patient name, date of birth, surgical site, and procedure verified Lesion destroyed using liquid nitrogen: Yes   Region frozen until ice ball extended beyond lesion: Yes   Outcome: patient tolerated procedure well with no complications   Post-procedure details: wound care instructions given      ACTINIC DAMAGE WITH PRECANCEROUS ACTINIC KERATOSES Counseling for Topical Chemotherapy Management: Patient exhibits: - Severe, confluent actinic changes with pre-cancerous actinic keratoses that is secondary to cumulative UV radiation exposure over time - Condition that is severe; chronic, not at goal. - diffuse scaly erythematous macules and papules with underlying dyspigmentation - Discussed Prescription "Field Treatment" topical Chemotherapy for Severe, Chronic Confluent Actinic Changes with Pre-Cancerous Actinic Keratoses Field treatment involves treatment of an entire area of skin that has confluent Actinic Changes (Sun/ Ultraviolet light damage) and PreCancerous Actinic Keratoses by method of PhotoDynamic Therapy (PDT) and/or prescription Topical Chemotherapy agents such as 5-fluorouracil, 5-fluorouracil/calcipotriene, and/or imiquimod.  The purpose is to decrease the number of clinically evident and subclinical PreCancerous lesions to prevent progression to development of skin cancer by chemically destroying early precancer changes that may or may not be visible.  It has been shown to reduce the risk of developing skin cancer in the treated  area. As a result of treatment, redness, scaling, crusting, and open sores may occur during treatment course. One or more than one of these methods may be used and may have to be used several times to control, suppress and eliminate the PreCancerous changes. Discussed treatment course, expected reaction, and possible side effects. - Recommend daily broad spectrum sunscreen SPF 30+ to sun-exposed areas, reapply every 2 hours as needed.  - Staying in the shade or wearing long sleeves, sun glasses (UVA+UVB protection) and wide brim hats (4-inch brim around the entire circumference of the hat) are also recommended. - Call for new or changing lesions. - Start fluorouracil 5% cream Apply to affected areas face (patient may spot treat or treat an entire area) once to twice daily x 4 weeks. Patient prefers 5FU Cream over the combo 5FU/Calcipotriene.  Return for 6-8-mos for TBSE, AKs.  Wendee Beavers, CMA, am acting as scribe for Armida Sans, MD .   Documentation: I have reviewed the above documentation for accuracy and completeness, and I agree with the above.  Armida Sans, MD

## 2023-11-11 ENCOUNTER — Ambulatory Visit: Payer: BC Managed Care – PPO | Admitting: Dermatology

## 2023-11-15 ENCOUNTER — Telehealth: Payer: Self-pay

## 2023-11-15 ENCOUNTER — Telehealth (INDEPENDENT_AMBULATORY_CARE_PROVIDER_SITE_OTHER): Payer: Self-pay

## 2023-11-15 ENCOUNTER — Encounter: Payer: Self-pay | Admitting: Dermatology

## 2023-11-15 NOTE — Telephone Encounter (Signed)
Message given

## 2023-11-15 NOTE — Telephone Encounter (Signed)
Patient left a voicemail that she used the 5FU mix x 3 days, but stopped it because she read that it could effect her kidnies, and she only has one. She would like your opinion on whether or not it is safe for her use. Please advise.

## 2023-11-15 NOTE — Telephone Encounter (Signed)
Because this is topical use it shouldn't be an issue.  The issue with the kidneys is more so to the kidney function, not the renal arteries.  In addition that is more so applicable when the medication is used in the IV form vs the topical form.  Because she is treating a cancerous lesion that can spread, not taking the medication in this instance would likely do her greater harm.  We recommend that she continue the medication.

## 2023-11-15 NOTE — Telephone Encounter (Signed)
Patient called stating Dr. Wyn Quaker has been monitoring her renal artery aneurysm. However, she was prescribed Fluorouracil 5% at her dermatologist that she uses on her face 2 times a day to kill the cancerous cell on her face. She stated when she done research on the cream she seen that it wasn't good for her kidneys. She used it 3 days and stopped. She would like to know if she can continue the medication or not.   Please advise.

## 2023-11-16 NOTE — Telephone Encounter (Signed)
Discussed with patient. She also asked if it was OK to just use at night instead of twice daily, and I advised her that she may not get as strong of a reaction, but if she wants to use it once daily that is fine. Pt also asked about wearing makeup, and I advised her as long as sites were open and crusting it is OK to wear makeup.

## 2023-11-23 ENCOUNTER — Ambulatory Visit: Payer: BC Managed Care – PPO | Admitting: Family Medicine

## 2023-11-29 ENCOUNTER — Encounter: Payer: Self-pay | Admitting: Family Medicine

## 2023-11-29 ENCOUNTER — Ambulatory Visit (INDEPENDENT_AMBULATORY_CARE_PROVIDER_SITE_OTHER): Payer: BC Managed Care – PPO | Admitting: Family Medicine

## 2023-11-29 VITALS — BP 129/82 | HR 73 | Resp 16 | Ht 63.0 in | Wt 143.3 lb

## 2023-11-29 DIAGNOSIS — I150 Renovascular hypertension: Secondary | ICD-10-CM | POA: Diagnosis not present

## 2023-11-29 DIAGNOSIS — I722 Aneurysm of renal artery: Secondary | ICD-10-CM

## 2023-11-29 DIAGNOSIS — E559 Vitamin D deficiency, unspecified: Secondary | ICD-10-CM | POA: Diagnosis not present

## 2023-11-29 DIAGNOSIS — R7303 Prediabetes: Secondary | ICD-10-CM | POA: Diagnosis not present

## 2023-11-29 LAB — POCT GLYCOSYLATED HEMOGLOBIN (HGB A1C): Hemoglobin A1C: 5.5 % (ref 4.0–5.6)

## 2023-11-29 NOTE — Assessment & Plan Note (Signed)
Hypertension is well-controlled with current medication regimen. Blood pressure readings were initially elevated but normalized upon retesting. - Continue losartan 25 mg daily - Continue baby aspirin daily

## 2023-11-29 NOTE — Assessment & Plan Note (Signed)
Annual ultrasounds with Dr. Debroah Loop are planned to monitor the aneurysm and kidney health. - Continue annual ultrasounds with Dr. Debroah Loop - Monitor kidney function and electrolytes regularly

## 2023-11-29 NOTE — Assessment & Plan Note (Signed)
Well-managed. Recent A1c is 5.5%, indicating good glycemic control. - Monitor A1c and kidney function regularly - Continue current management plan

## 2023-11-29 NOTE — Progress Notes (Signed)
   Established Patient Office Visit  Subjective   Patient ID: Natalie Dickerson, female    DOB: 08-21-1961  Age: 62 y.o. MRN: [id number]  Chief Complaint  Patient presents with   Medical Management of Chronic Issues    HTN and Prediabetes    HPI  Discussed the use of AI scribe software for clinical note transcription with the patient, who gave verbal consent to proceed.  History of Present Illness   The patient presents for a routine follow-up visit. They report adherence to their current medication regimen, which includes losartan 25mg  daily and baby aspirin. They also take vitamin D and a multivitamin intermittently. They have not received a flu shot this year and are considering the shingles vaccine, but have not yet decided to receive it. They have a history of kidney issues and are under the care of Dr. Debroah Loop, whom they will see in January. They also report using a topical cream prescribed by their dermatologist for precancerous skin lesions.         ROS    Objective:     BP 129/82 (BP Location: Left Arm, Patient Position: Sitting, Cuff Size: Normal)   Pulse 73   Resp 16   Ht 5\' 3"  (1.6 m)   Wt 143 lb 4.8 oz (65 kg)   BMI 25.38 kg/m    Physical Exam   Results for orders placed or performed in visit on 11/29/23  POCT glycosylated hemoglobin (Hb A1C)  Result Value Ref Range   Hemoglobin A1C 5.5 4.0 - 5.6 %      The 10-year ASCVD risk score (Arnett DK, et al., 2019) is: 3.9%    Assessment & Plan:   Problem List Items Addressed This Visit       Cardiovascular and Mediastinum   Renal artery aneurysm of native kidney [facility])    Annual ultrasounds with Dr. Debroah Loop are planned to monitor the aneurysm and kidney health. - Continue annual ultrasounds with Dr. Debroah Loop - Monitor kidney function and electrolytes regularly      Renovascular hypertension    Hypertension is well-controlled with current medication regimen. Blood pressure readings were initially elevated but  normalized upon retesting. - Continue losartan 25 mg daily - Continue baby aspirin daily      Relevant Orders   Basic Metabolic Panel (BMET)     Other   Avitaminosis D   Prediabetes - Primary    Well-managed. Recent A1c is 5.5%, indicating good glycemic control. - Monitor A1c and kidney function regularly - Continue current management plan      Relevant Orders   POCT glycosylated hemoglobin (Hb A1C) (Completed)        Actinic Keratosis Actinic Keratosis being treated with topical cream. Reassured that the cream does not pose a risk to kidney function as it is not absorbed systemically. - Continue topical treatment as prescribed by dermatologist - Avoid sun exposure and use sun protection  General Health Maintenance Patient has not received a flu shot this year and is considering the shingles vaccine. Discussed the importance of vaccinations, especially given family history of shingles. - Consider shingles vaccine - Schedule flu shot if patient decides to receive it  Follow-up - Schedule next physical in early June - Follow up with Dr. Wyn Quaker in January - Notify patient of lab results when available.        Return in about 6 months (around 05/29/2024) for CPE.    Shirlee Latch, MD

## 2023-12-06 ENCOUNTER — Telehealth: Payer: Self-pay

## 2023-12-06 NOTE — Telephone Encounter (Signed)
Patient has been using 5FU on chest for three weeks and reaction was too much to tolerate any longer. Patient has d/c and letting the area heal at this time. Patient will complete 4 weeks of cream on face after this week. Patient is tolerating reaction and feels okay to finish until Thursday. aw

## 2024-01-11 ENCOUNTER — Ambulatory Visit (INDEPENDENT_AMBULATORY_CARE_PROVIDER_SITE_OTHER): Payer: BC Managed Care – PPO

## 2024-01-11 ENCOUNTER — Encounter (INDEPENDENT_AMBULATORY_CARE_PROVIDER_SITE_OTHER): Payer: Self-pay | Admitting: Vascular Surgery

## 2024-01-11 ENCOUNTER — Ambulatory Visit (INDEPENDENT_AMBULATORY_CARE_PROVIDER_SITE_OTHER): Payer: BC Managed Care – PPO | Admitting: Vascular Surgery

## 2024-01-11 VITALS — BP 125/84 | HR 64 | Resp 18 | Ht 63.0 in | Wt 141.0 lb

## 2024-01-11 DIAGNOSIS — I722 Aneurysm of renal artery: Secondary | ICD-10-CM | POA: Diagnosis not present

## 2024-01-11 DIAGNOSIS — I150 Renovascular hypertension: Secondary | ICD-10-CM | POA: Diagnosis not present

## 2024-01-11 DIAGNOSIS — H93A9 Pulsatile tinnitus, unspecified ear: Secondary | ICD-10-CM

## 2024-01-11 NOTE — Progress Notes (Signed)
 MRN : [id number]  Natalie Dickerson is a 63 y.o. (1961-06-15) female who presents with chief complaint of  Chief Complaint  Patient presents with   Follow-up    f/u in 1 year with renal  .  History of Present Illness: Patient returns today in follow up of her renal artery aneurysm.  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  Her left kidney has been followed and we have not seen any aneurysmal degeneration or significant stenosis in the left renal artery.  She has normal renal function as of last check by her primary care physician.  Her blood pressure is markedly improved and she is now down to 1 blood pressure medication.  Current Outpatient Medications  Medication Sig Dispense Refill   aspirin  EC 81 MG EC tablet Take 1 tablet (81 mg total) by mouth daily. 90 tablet 3   losartan  (COZAAR ) 25 MG tablet Take 1 tablet (25 mg total) by mouth daily. 90 tablet 3   Multiple Vitamins-Minerals (MULTIVITAMIN WITH MINERALS) tablet Take 1 tablet by mouth daily.     Omega-3 Fatty Acids (OMEGA-3 1450 PO) Take by mouth.     cholecalciferol (VITAMIN D ) 1000 units tablet Take 1,000 Units by mouth daily. (Patient not taking: Reported on 01/11/2024)     fluorouracil  (EFUDEX ) 5 % cream Apply to affected areas face once to twice daily x 4 weeks. (Patient not taking: Reported on 01/11/2024) 40 g 2   No current facility-administered medications for this visit.    Past Medical History:  Diagnosis Date   Actinic keratosis 05/01/2014   Left superior forehead   Cancer (HCC)    skin ca   Hx of basal cell carcinoma 12/06/2013   Left sup forehead    Past Surgical History:  Procedure Laterality Date   COLONOSCOPY WITH PROPOFOL  N/A 08/07/2022   Procedure: COLONOSCOPY WITH PROPOFOL ;  Surgeon: Jinny Carmine, MD;  Location: [facility] ENDOSCOPY;  Service: Endoscopy;  Laterality: N/A;   EMBOLIZATION (CATH LAB) N/A 03/24/2017   Procedure: Embolization;   Surgeon: Selinda GORMAN Gu, MD;  Location: [facility] INVASIVE CV LAB;  Service: Cardiovascular;  Laterality: N/A;   FINGER SURGERY     PERIPHERAL VASCULAR CATHETERIZATION N/A 01/18/2017   Procedure: Renal Angiography;  Surgeon: Selinda GORMAN Gu, MD;  Location: [facility] INVASIVE CV LAB;  Service: Cardiovascular;  Laterality: N/A;   PERIPHERAL VASCULAR CATHETERIZATION  01/18/2017   Procedure: Renal Intervention;  Surgeon: Selinda GORMAN Gu, MD;  Location: [facility] INVASIVE CV LAB;  Service: Cardiovascular;;   RENAL ANGIOGRAPHY N/A 03/24/2017   Procedure: Renal Angiography;  Surgeon: Selinda GORMAN Gu, MD;  Location: [facility] INVASIVE CV LAB;  Service: Cardiovascular;  Laterality: N/A;     Social History   Tobacco Use   Smoking status: Former    Types: Cigarettes   Smokeless tobacco: Never   Tobacco comments:    Smoked for about 5 years, smoked about 1 pack per day. Quit in 1984  Vaping Use   Vaping status: Never Used  Substance Use Topics   Alcohol use: Yes    Comment: Occasional alcohol use; Drinks 2-4 glasses of wine per week.   Drug use: No      Family History  Problem Relation Age of Onset   Hypertension Mother    Breast cancer Mother 33   Cancer Mother        breast cancer   Breast cancer Father 61   Hypertension Brother  Allergies  Allergen Reactions   Bee Venom Anaphylaxis   Ivp Dye [Iodinated Contrast Media] Rash     REVIEW OF SYSTEMS (Negative unless checked)  Constitutional: [] Weight loss  [] Fever  [] Chills Cardiac: [] Chest pain   [] Chest pressure   [] Palpitations   [] Shortness of breath when laying flat   [] Shortness of breath at rest   [] Shortness of breath with exertion. Vascular:  [] Pain in legs with walking   [] Pain in legs at rest   [] Pain in legs when laying flat   [] Claudication   [] Pain in feet when walking  [] Pain in feet at rest  [] Pain in feet when laying flat   [] History of DVT   [] Phlebitis   [] Swelling in legs   [] Varicose veins   [] Non-healing ulcers Pulmonary:   [] Uses home  oxygen   [] Productive cough   [] Hemoptysis   [] Wheeze  [] COPD   [] Asthma Neurologic:  [] Dizziness  [] Blackouts   [] Seizures   [] History of stroke   [] History of TIA  [] Aphasia   [] Temporary blindness   [] Dysphagia   [] Weakness or numbness in arms   [] Weakness or numbness in legs Musculoskeletal:  [x] Arthritis   [] Joint swelling   [] Joint pain   [x] Low back pain Hematologic:  [] Easy bruising  [] Easy bleeding   [] Hypercoagulable state   [] Anemic   Gastrointestinal:  [] Blood in stool   [] Vomiting blood  [] Gastroesophageal reflux/heartburn   [] Abdominal pain Genitourinary:  [] Chronic kidney disease   [] Difficult urination  [] Frequent urination  [] Burning with urination   [] Hematuria Skin:  [] Rashes   [] Ulcers   [] Wounds Psychological:  [] History of anxiety   []  History of major depression.  Physical Examination  BP 125/84   Pulse 64   Resp 18   Ht 5' 3 (1.6 m)   Wt 141 lb (64 kg)   BMI 24.98 kg/m  Gen:  WD/WN, NAD. Appears younger than stated age. Head: /AT, No temporalis wasting. Ear/Nose/Throat: Hearing grossly intact, nares w/o erythema or drainage Eyes: Conjunctiva clear. Sclera non-icteric Neck: Supple.  Trachea midline Pulmonary:  Good air movement, no use of accessory muscles.  Cardiac: RRR, no JVD Vascular:  Vessel Right Left  Radial Palpable Palpable           Musculoskeletal: M/S 5/5 throughout.  No deformity or atrophy. No edema. Neurologic: Sensation grossly intact in extremities.  Symmetrical.  Speech is fluent.  Psychiatric: Judgment intact, Mood & affect appropriate for pt's clinical situation. Dermatologic: No rashes or ulcers noted.  No cellulitis or open wounds.      Labs Recent Results (from the past 2160 hours)  POCT glycosylated hemoglobin (Hb A1C)     Status: None   Collection Time: 11/29/23  3:54 PM  Result Value Ref Range   Hemoglobin A1C 5.5 4.0 - 5.6 %    Radiology No results found.  Assessment/Plan  Pulsatile tinnitus Carotid duplex was  normal.  Recheck as needed   Renovascular hypertension Better after treatment for her renal artery aneurysm.  Has been backing off her medicine and will discuss this with her primary care physician. Now down to one anti-hypertensive   Renal artery aneurysm of native kidney [facility]) Her duplex today shows an atretic right kidney with no right renal artery flow and a patent left renal artery without hemodynamically significant stenosis or aneurysm identified. Doing well.  Will continue renal artery duplex on an annual basis for her solitary kidney evaluating for stenosis or aneurysm.   Selinda Gu, MD  01/11/2024 9:17 AM  This note was created with Dragon medical transcription system.  Any errors from dictation are purely unintentional

## 2024-01-24 ENCOUNTER — Other Ambulatory Visit: Payer: Self-pay

## 2024-03-23 ENCOUNTER — Other Ambulatory Visit: Payer: Self-pay | Admitting: Family Medicine

## 2024-03-23 DIAGNOSIS — I150 Renovascular hypertension: Secondary | ICD-10-CM

## 2024-03-30 ENCOUNTER — Telehealth: Payer: Self-pay

## 2024-03-30 DIAGNOSIS — I150 Renovascular hypertension: Secondary | ICD-10-CM

## 2024-03-30 NOTE — Telephone Encounter (Signed)
 Copied from CRM [id number]. Topic: Clinical - Medication Question >> Mar 29, 2024  4:17 PM Everette C wrote: Reason for CRM: The patient would like to speak with a member of clinical staff when possible about their prescription refill of losartan (COZAAR) 25 MG tablet [[id number]] and continuing the medication until their physical on 05/30/24  Please contact further when available

## 2024-03-31 MED ORDER — LOSARTAN POTASSIUM 25 MG PO TABS: 25.0000 mg | ORAL_TABLET | Freq: Every day | ORAL | 0 refills | Status: DC

## 2024-03-31 NOTE — Addendum Note (Signed)
 Addended by: Lily Kocher on: 03/31/2024 04:24 PM   Modules accepted: Orders

## 2024-04-04 ENCOUNTER — Ambulatory Visit (INDEPENDENT_AMBULATORY_CARE_PROVIDER_SITE_OTHER): Admitting: Family Medicine

## 2024-04-04 ENCOUNTER — Encounter: Payer: Self-pay | Admitting: Family Medicine

## 2024-04-04 ENCOUNTER — Ambulatory Visit: Payer: Self-pay

## 2024-04-04 VITALS — BP 136/86 | HR 74 | Temp 98.7°F | Ht 63.0 in | Wt 143.7 lb

## 2024-04-04 DIAGNOSIS — I150 Renovascular hypertension: Secondary | ICD-10-CM

## 2024-04-04 DIAGNOSIS — J209 Acute bronchitis, unspecified: Secondary | ICD-10-CM | POA: Diagnosis not present

## 2024-04-04 DIAGNOSIS — R0981 Nasal congestion: Secondary | ICD-10-CM

## 2024-04-04 LAB — POC COVID19/FLU A&B COMBO
Covid Antigen, POC: POSITIVE — AB
Influenza A Antigen, POC: NEGATIVE
Influenza B Antigen, POC: NEGATIVE

## 2024-04-04 MED ORDER — FLUTICASONE PROPIONATE 50 MCG/ACT NA SUSP: 2.0000 | Freq: Every day | NASAL | 6 refills | Status: DC

## 2024-04-04 MED ORDER — AZITHROMYCIN 250 MG PO TABS: ORAL_TABLET | ORAL | 0 refills | Status: DC

## 2024-04-04 MED ORDER — PREDNISONE 20 MG PO TABS: 40.0000 mg | ORAL_TABLET | Freq: Every day | ORAL | 0 refills | Status: AC

## 2024-04-04 MED ORDER — LOSARTAN POTASSIUM 25 MG PO TABS: 25.0000 mg | ORAL_TABLET | Freq: Every day | ORAL | 0 refills | Status: DC

## 2024-04-04 NOTE — Progress Notes (Signed)
 Acute visit   [name]   DOB: 05/09/1961   63 y.o. Female  MRN: [id number] PCP: Erasmo Downer, MD   Chief Complaint  Patient presents with   Chest Heaviness    Patient is being seen due to chest heaviness associated with cough that has been present for one month but cold symptoms are new that consist of sore throat and runny nose. Patient denies fever, chest pain, chest tightness. Pt has hx of aneurysm on kidney before. Pt reports she started taking tylenol cold and cough drops for symptoms. Last ttok tylenol around 12:15-12:30. Reports chest is not feeling as heavy as it did earlier now that she has taken something   Subjective    Discussed the use of AI scribe software for clinical note transcription with the patient, who gave verbal consent to proceed.  History of Present Illness   The patient, with a history of hypertension, presents with a persistent cough and a sensation of chest heaviness. The heaviness is not described as tightness, but rather a weight that is more noticeable with movement. The patient also reports fatigue. Recently, the patient developed a cold, with symptoms of a burning throat and nasal congestion. The patient denies fever and ear pain, but reports a sensation of coldness and pressure in the ears. The patient has been managing symptoms with Tylenol and a cough and cold medication. The patient denies a history of asthma and smoking.        Review of Systems  Objective    BP 136/86   Pulse 74   Temp 98.7 F (37.1 C) (Oral)   Ht 5\' 3"  (1.6 m)   Wt 143 lb 11.2 oz (65.2 kg)   SpO2 98%   BMI 25.46 kg/m  Physical Exam Vitals reviewed.  Constitutional:      General: She is not in acute distress.    Appearance: Normal appearance. She is well-developed. She is not diaphoretic.  HENT:     Head: Normocephalic and atraumatic.     Right Ear: Tympanic membrane, ear canal and external ear normal.     Left Ear: Tympanic membrane, ear  canal and external ear normal.     Nose: Congestion present.     Mouth/Throat:     Mouth: Mucous membranes are moist.     Pharynx: Oropharynx is clear. No oropharyngeal exudate.  Eyes:     General: No scleral icterus.    Conjunctiva/sclera: Conjunctivae normal.     Pupils: Pupils are equal, round, and reactive to light.  Cardiovascular:     Rate and Rhythm: Normal rate and regular rhythm.     Heart sounds: Normal heart sounds.  Pulmonary:     Effort: Pulmonary effort is normal. No respiratory distress.     Breath sounds: Wheezing and rhonchi present. No rales.  Musculoskeletal:     Cervical back: Neck supple.     Right lower leg: No edema.     Left lower leg: No edema.  Lymphadenopathy:     Cervical: No cervical adenopathy.  Skin:    General: Skin is warm and dry.     Findings: No rash.  Neurological:     Mental Status: She is alert.       No results found for any visits on 04/04/24.  Assessment & Plan     Problem List Items Addressed This Visit       Cardiovascular and Mediastinum   Renovascular hypertension   Relevant Medications  losartan (COZAAR) 25 MG tablet   Other Visit Diagnoses       Acute bronchitis, unspecified organism    -  Primary     Nasal congestion       Relevant Orders   POC Covid19/Flu A&B Antigen           Chest heaviness and cough - COVID 19 Reports chest heaviness and persistent cough for 1-2 months, exacerbated by physical activity. Examination reveals wheezing, crackles, and lung inflammation, suggesting possible bronchitis or lingering respiratory infection. Differential diagnosis includes cold or allergies, with bronchitis likely due to prolonged symptoms. - Prescribe prednisone 40 mg daily for 5 days, taken in the morning with breakfast to protect the stomach and provide energy. - Prescribe azithromycin (Z-Pak) with a loading dose of two pills on the first day, followed by one pill daily for the next four days. - Recommend Flonase  nasal spray, two sprays in each nostril once daily, to alleviate nasal congestion and postnasal drip. - Send prescriptions to [facility] on [address]. - Advise to report if symptoms do not improve. - COVID 19 positive at end of visit - does not want antiviral at this time - cough was occurring before this so will conitnue prednisone and azithro - will treat symptoamtically  Medication refill Requires medication refill, which was running out before her next scheduled visit. Refill request confirmed and sent to the pharmacy. - Send medication refill to [facility].  General Health Maintenance Due for physical examination and cholesterol screening. Fasting is required prior to blood work. - Reschedule physical examination to a date after May 22. - Ensure fasting before cholesterol screening.       Meds ordered this encounter  Medications   losartan (COZAAR) 25 MG tablet    Sig: Take 1 tablet (25 mg total) by mouth daily. TAKE 1 TABLET BY MOUTH DAILY    Dispense:  90 tablet    Refill:  0    Please send a replace/new response with 90-Day Supply if appropriate to maximize member benefit. Requesting 1 year supply.   predniSONE (DELTASONE) 20 MG tablet    Sig: Take 2 tablets (40 mg total) by mouth daily with breakfast for 5 days.    Dispense:  10 tablet    Refill:  0   azithromycin (ZITHROMAX) 250 MG tablet    Sig: Take 500mg  PO daily x1d and then 250mg  daily x4 days    Dispense:  6 each    Refill:  0   fluticasone (FLONASE) 50 MCG/ACT nasal spray    Sig: Place 2 sprays into both nostrils daily.    Dispense:  16 g    Refill:  6     Return if symptoms worsen or fail to improve.      Shirlee Latch, MD  [facility] [phone] (phone) [phone] (fax)  [facility]

## 2024-04-04 NOTE — Telephone Encounter (Signed)
  Chief Complaint: Chest heaviness, cough, sore throat, runny nose Symptoms: see above Frequency: Cough for one month, cold symptoms are new Pertinent Negatives: Patient denies fever, chest pain, chest tightness Disposition: [] ED /[] Urgent Care (no appt availability in office) / [x] Appointment(In office/virtual)/ []  [facility]/ [] Home Care/ [] Refused Recommended Disposition /[] [place] [facility] [facility]/ []  Follow-up with PCP Additional Notes: Patient called in stating she is feeling heaviness in her chest related to an ongoing cough and a new onset of a cold. Patient denies that her chest feels any pain, pressure, or tightness.Marland Kitchen and states "it just feels heavy, like from a cold". Patient denies pain in arm, jaw, back. Patient denies nausea, sweating, difficulty breathing, and wheezing. Patient states she is not having burning in her throat and a runny nose. Patient appt made for today for further evaluation. Educated patient to call 911 or go to nearest ED if she develops symptoms of chest pain and/or radiating to jaw, back, difficulty breathing.   Copied from CRM [id number]. Topic: Clinical - Red Word Triage >> Apr 04, 2024 11:41 AM [place] B wrote: [facility] that prompted transfer to Nurse Triage: chest heavy, sore throat and burning Reason for Disposition  [1] Continuous (nonstop) coughing interferes with work or school AND [2] no improvement using cough treatment per Care Advice  Answer Assessment - Initial Assessment Questions 1. ONSET: "When did the cough begin?"      A month 2. SEVERITY: "How bad is the cough today?"      Cough with sore throat 3. SPUTUM: "Describe the color of your sputum" (none, dry cough; clear, white, yellow, green)     clear 4. HEMOPTYSIS: "Are you coughing up any blood?" If so ask: "How much?" (flecks, streaks, tablespoons, etc.)     No 5. DIFFICULTY BREATHING: "Are you having difficulty breathing?" If Yes, ask: "How bad is it?" (e.g., mild, moderate,  severe)    - MILD: No SOB at rest, mild SOB with walking, speaks normally in sentences, can lie down, no retractions, pulse < 100.    - MODERATE: SOB at rest, SOB with minimal exertion and prefers to sit, cannot lie down flat, speaks in phrases, mild retractions, audible wheezing, pulse 100-120.    - SEVERE: Very SOB at rest, speaks in single words, struggling to breathe, sitting hunched forward, retractions, pulse > 120      No  6. FEVER: "Do you have a fever?" If Yes, ask: "What is your temperature, how was it measured, and when did it start?"     No 7. CARDIAC HISTORY: "Do you have any history of heart disease?" (e.g., heart attack, congestive heart failure)      No 8. LUNG HISTORY: "Do you have any history of lung disease?"  (e.g., pulmonary embolus, asthma, emphysema)     No 9. PE RISK FACTORS: "Do you have a history of blood clots?" (or: recent major surgery, recent prolonged travel, bedridden)     Aneurysm on kidney before 10. OTHER SYMPTOMS: "Do you have any other symptoms?" (e.g., runny nose, wheezing, chest pain)       Nasal runniness, sore throat  Protocols used: Cough - Acute Productive-A-AH

## 2024-04-06 NOTE — Telephone Encounter (Signed)
 Noted.

## 2024-04-21 DIAGNOSIS — S8001XA Contusion of right knee, initial encounter: Secondary | ICD-10-CM | POA: Diagnosis not present

## 2024-05-09 DIAGNOSIS — M79661 Pain in right lower leg: Secondary | ICD-10-CM | POA: Diagnosis not present

## 2024-05-09 DIAGNOSIS — S8011XA Contusion of right lower leg, initial encounter: Secondary | ICD-10-CM | POA: Diagnosis not present

## 2024-05-10 ENCOUNTER — Telehealth (INDEPENDENT_AMBULATORY_CARE_PROVIDER_SITE_OTHER): Payer: Self-pay

## 2024-05-10 DIAGNOSIS — M79661 Pain in right lower leg: Secondary | ICD-10-CM | POA: Diagnosis not present

## 2024-05-10 NOTE — Telephone Encounter (Signed)
 Natalie Dickerson called to see if it was okay for her have her MRI done today at 5:00 on her Knee and leg being that she has stents in her kidneys.   Per [name]: She is fine to have the MRI

## 2024-05-12 DIAGNOSIS — S8001XA Contusion of right knee, initial encounter: Secondary | ICD-10-CM | POA: Diagnosis not present

## 2024-05-12 DIAGNOSIS — M2241 Chondromalacia patellae, right knee: Secondary | ICD-10-CM | POA: Diagnosis not present

## 2024-05-12 DIAGNOSIS — S83281A Other tear of lateral meniscus, current injury, right knee, initial encounter: Secondary | ICD-10-CM | POA: Diagnosis not present

## 2024-05-12 DIAGNOSIS — S83241A Other tear of medial meniscus, current injury, right knee, initial encounter: Secondary | ICD-10-CM | POA: Diagnosis not present

## 2024-05-16 ENCOUNTER — Encounter (INDEPENDENT_AMBULATORY_CARE_PROVIDER_SITE_OTHER): Payer: Self-pay

## 2024-05-30 ENCOUNTER — Encounter: Payer: Self-pay | Admitting: Family Medicine

## 2024-06-02 DIAGNOSIS — M25561 Pain in right knee: Secondary | ICD-10-CM | POA: Diagnosis not present

## 2024-06-02 DIAGNOSIS — M222X1 Patellofemoral disorders, right knee: Secondary | ICD-10-CM | POA: Diagnosis not present

## 2024-06-06 ENCOUNTER — Ambulatory Visit (INDEPENDENT_AMBULATORY_CARE_PROVIDER_SITE_OTHER): Admitting: Family Medicine

## 2024-06-06 ENCOUNTER — Encounter: Payer: Self-pay | Admitting: Family Medicine

## 2024-06-06 VITALS — BP 116/76 | HR 73 | Temp 98.8°F | Ht 63.0 in | Wt 140.1 lb

## 2024-06-06 DIAGNOSIS — Z0000 Encounter for general adult medical examination without abnormal findings: Secondary | ICD-10-CM | POA: Diagnosis not present

## 2024-06-06 DIAGNOSIS — E538 Deficiency of other specified B group vitamins: Secondary | ICD-10-CM | POA: Diagnosis not present

## 2024-06-06 DIAGNOSIS — L249 Irritant contact dermatitis, unspecified cause: Secondary | ICD-10-CM

## 2024-06-06 DIAGNOSIS — I722 Aneurysm of renal artery: Secondary | ICD-10-CM | POA: Diagnosis not present

## 2024-06-06 DIAGNOSIS — E559 Vitamin D deficiency, unspecified: Secondary | ICD-10-CM

## 2024-06-06 DIAGNOSIS — I150 Renovascular hypertension: Secondary | ICD-10-CM | POA: Diagnosis not present

## 2024-06-06 DIAGNOSIS — R7303 Prediabetes: Secondary | ICD-10-CM

## 2024-06-06 DIAGNOSIS — Z1231 Encounter for screening mammogram for malignant neoplasm of breast: Secondary | ICD-10-CM

## 2024-06-06 NOTE — Progress Notes (Signed)
 Complete physical exam   [name]   DOB: 1961-03-07   63 y.o. Female  MRN: [id number] Visit Date: 06/06/2024  Today's healthcare provider: Aden Agreste, MD   Chief Complaint  Patient presents with   Annual Exam    Diet- General, unhealthy Exercise- Not lately Feeling- Pretty good Sleeping- Good, no complaints Concerns- Ear is itching, would like to be evaluated  Shingles Vaccine- decline   Subjective    Natalie Dickerson is a 63 y.o. female who presents today for a complete physical exam.   Discussed the use of AI scribe software for clinical note transcription with the patient, who gave verbal consent to proceed.  History of Present Illness   Natalie Dickerson is a 63 year old female who presents for an annual physical exam.  She experiences an ongoing itch in her ear, with dry, irritated skin in her left ear. She applies alcohol to the area and suspects scratching, possibly during sleep. There is wax buildup in her right ear but no hearing issues.  Six weeks ago, she fell, resulting in a hematoma on her leg, which persists but is less sore. The fall aggravated her knee, causing a burning sensation on the side of her knee when sitting for extended periods.  She reports feeling congested and having heavy lungs after a previous visit, with noted improvement over the past week.  She takes losartan  25 mg daily and baby aspirin  daily. She has low vitamin D  and B12 levels, prediabetes, and dense breast tissue. She has not engaged in regular exercise for six to seven years due to spinal stenosis.       Last depression screening scores    06/06/2024    8:41 AM 11/29/2023    3:20 PM 05/18/2023   10:26 AM  PHQ 2/9 Scores  PHQ - 2 Score 0 0 0  PHQ- 9 Score 1  1   Last fall risk screening    06/06/2024    8:40 AM  Fall Risk   Falls in the past year? 1  Number falls in past yr: 0  Injury with Fall? 1  Comment Hit shin bone and it irritated her knee         Medications: Outpatient Medications Prior to Visit  Medication Sig   aspirin  EC 81 MG EC tablet Take 1 tablet (81 mg total) by mouth daily.   losartan  (COZAAR ) 25 MG tablet Take 1 tablet (25 mg total) by mouth daily. TAKE 1 TABLET BY MOUTH DAILY   Multiple Vitamins-Minerals (MULTIVITAMIN WITH MINERALS) tablet Take 1 tablet by mouth daily.   [DISCONTINUED] Omega-3 Fatty Acids (OMEGA-3 1450 PO) Take by mouth.   [DISCONTINUED] azithromycin  (ZITHROMAX ) 250 MG tablet Take 500mg  PO daily x1d and then 250mg  daily x4 days   [DISCONTINUED] fluticasone  (FLONASE ) 50 MCG/ACT nasal spray Place 2 sprays into both nostrils daily.   No facility-administered medications prior to visit.    Review of Systems    Objective    BP 116/76 (BP Location: Left Arm, Patient Position: Sitting, Cuff Size: Normal)   Pulse 73   Temp 98.8 F (37.1 C) (Oral)   Ht 5\' 3"  (1.6 m)   Wt 140 lb 1.6 oz (63.5 kg)   SpO2 97%   BMI 24.82 kg/m    Physical Exam Vitals reviewed.  Constitutional:      General: She is not in acute distress.    Appearance: Normal appearance. She is well-developed. She is not diaphoretic.  HENT:     Head: Normocephalic and atraumatic.     Right Ear: Tympanic membrane, ear canal and external ear normal.     Left Ear: Tympanic membrane, ear canal and external ear normal.     Nose: Nose normal.     Mouth/Throat:     Mouth: Mucous membranes are moist.     Pharynx: Oropharynx is clear. No oropharyngeal exudate.  Eyes:     General: No scleral icterus.    Conjunctiva/sclera: Conjunctivae normal.     Pupils: Pupils are equal, round, and reactive to light.  Neck:     Thyroid: No thyromegaly.  Cardiovascular:     Rate and Natalie: Normal rate and regular Natalie.     Heart sounds: Normal heart sounds. No murmur heard. Pulmonary:     Effort: Pulmonary effort is normal. No respiratory distress.     Breath sounds: Normal breath sounds. No wheezing or rales.  Abdominal:      General: There is no distension.     Palpations: Abdomen is soft.     Tenderness: There is no abdominal tenderness.  Musculoskeletal:        General: No deformity.     Cervical back: Neck supple.     Right lower leg: No edema.     Left lower leg: No edema.  Lymphadenopathy:     Cervical: No cervical adenopathy.  Skin:    General: Skin is warm and dry.     Findings: No rash.  Neurological:     Mental Status: She is alert and oriented to person, place, and time. Mental status is at baseline.     Gait: Gait normal.  Psychiatric:        Mood and Affect: Mood normal.        Behavior: Behavior normal.        Thought Content: Thought content normal.      No results found for any visits on 06/06/24.  Assessment & Plan    Routine Health Maintenance and Physical Exam  Exercise Activities and Dietary recommendations  Goals   None     Immunization History  Administered Date(s) Administered   Td 07/13/2016   Tdap 08/16/2008    Health Maintenance  Topic Date Due   COVID-19 Vaccine (1) Never done   Zoster Vaccines- Shingrix (1 of 2) Never done   INFLUENZA VACCINE  07/28/2024   MAMMOGRAM  06/23/2025   DTaP/Tdap/Td (3 - Td or Tdap) 07/13/2026   Cervical Cancer Screening (HPV/Pap Cotest)  05/13/2027   Colonoscopy  08/07/2029   Hepatitis C Screening  Completed   HIV Screening  Completed   HPV VACCINES  Aged Out   Meningococcal B Vaccine  Aged Out    Discussed health benefits of physical activity, and encouraged her to engage in regular exercise appropriate for her age and condition.  Problem List Items Addressed This Visit       Cardiovascular and Mediastinum   Renal artery aneurysm of native kidney [facility])   Annual ultrasounds with Dr. Renna Cary are planned to monitor the aneurysm and kidney health. - Continue annual ultrasounds with Dr. Renna Cary - Monitor kidney function and electrolytes regularly      Renovascular hypertension   Relevant Orders   Comprehensive metabolic panel  with GFR   Lipid panel     Other   Avitaminosis D   Relevant Orders   VITAMIN D  25 Hydroxy (Vit-D Deficiency, Fractures)   B12 deficiency   Relevant Orders   B12  Prediabetes   Relevant Orders   Hemoglobin A1c   Other Visit Diagnoses       Encounter for annual physical exam    -  Primary   Relevant Orders   Comprehensive metabolic panel with GFR   Lipid panel   VITAMIN D  25 Hydroxy (Vit-D Deficiency, Fractures)   B12   Hemoglobin A1c     Breast cancer screening by mammogram       Relevant Orders   MM 3D SCREENING MAMMOGRAM BILATERAL BREAST     Irritant contact dermatitis of external ear               Itching in the ear Chronic itching due to dry skin or dermatitis, with no signs of infection. Alcohol use may exacerbate dryness. - Apply hydrocortisone cream to the affected ear - Avoid using alcohol in the ear  Hematoma Hematoma on the leg from a fall six weeks ago, improving with reduced soreness. Aggravated pre-existing knee issues causing a burning sensation when sitting for prolonged periods.  Spinal stenosis Spinal stenosis with recommendation for low-impact exercises to avoid symptom exacerbation. Suggested activities include water aerobics, yoga, and Pilates for core strengthening. Advised against high-impact activities and exercises that may strain the back. - Engage in low-impact exercises such as water aerobics, elliptical, and biking - Avoid high-impact activities such as running and jumping - Consider core strengthening exercises to support the back - Monitor for any pain and adjust activities accordingly  General Health Maintenance Up to date on most screenings and vaccinations. Mammogram due this month, order sent to [facility]. Pap smear up to date until 2028. Colonoscopy due in 2030. Tetanus shot due in 2027. Aware of shingles vaccine but opted to wait. Routine lab tests due, including A1c, kidney and liver function, cholesterol, vitamin D , and B12  levels. - Order mammogram - Perform routine lab tests including A1c, kidney and liver function, cholesterol, vitamin D , and B12 levels - Discussed importance of preventive care and insurance coverage at 100% outside of deductible        Return in about 6 months (around 12/06/2024) for chronic disease f/u.     Aden Agreste, MD  [facility] [phone] (phone) [phone] (fax)  [facility]

## 2024-06-06 NOTE — Assessment & Plan Note (Signed)
 Blood pressure is well-controlled with losartan  25 mg daily and baby aspirin . Six-month follow-up for blood pressure monitoring planned. - Continue current antihypertensive medication regimen - Schedule six-month follow-up for blood pressure monitoring

## 2024-06-06 NOTE — Patient Instructions (Signed)
 Call [facility] to schedule a mammogram [phone]

## 2024-06-06 NOTE — Assessment & Plan Note (Signed)
 Recommend low carb diet Recheck A1c

## 2024-06-06 NOTE — Assessment & Plan Note (Signed)
 Annual ultrasounds with Dr. Debroah Loop are planned to monitor the aneurysm and kidney health. - Continue annual ultrasounds with Dr. Debroah Loop - Monitor kidney function and electrolytes regularly

## 2024-06-07 ENCOUNTER — Ambulatory Visit: Payer: Self-pay | Admitting: Family Medicine

## 2024-06-07 LAB — COMPREHENSIVE METABOLIC PANEL WITH GFR
ALT: 15 IU/L (ref 0–32)
AST: 23 IU/L (ref 0–40)
Albumin: 4.6 g/dL (ref 3.9–4.9)
Alkaline Phosphatase: 88 IU/L (ref 44–121)
BUN/Creatinine Ratio: 21 (ref 12–28)
BUN: 19 mg/dL (ref 8–27)
Bilirubin Total: 0.4 mg/dL (ref 0.0–1.2)
CO2: 23 mmol/L (ref 20–29)
Calcium: 9.8 mg/dL (ref 8.7–10.3)
Chloride: 105 mmol/L (ref 96–106)
Creatinine, Ser: 0.91 mg/dL (ref 0.57–1.00)
Globulin, Total: 2.5 g/dL (ref 1.5–4.5)
Glucose: 89 mg/dL (ref 70–99)
Potassium: 4.7 mmol/L (ref 3.5–5.2)
Sodium: 142 mmol/L (ref 134–144)
Total Protein: 7.1 g/dL (ref 6.0–8.5)
eGFR: 72 mL/min/{1.73_m2} (ref >59)

## 2024-06-07 LAB — LIPID PANEL
Chol/HDL Ratio: 2.7 ratio (ref 0.0–4.4)
Cholesterol, Total: 221 mg/dL — ABNORMAL HIGH (ref 100–199)
HDL: 82 mg/dL (ref >39)
LDL Chol Calc (NIH): 119 mg/dL — ABNORMAL HIGH (ref 0–99)
Triglycerides: 113 mg/dL (ref 0–149)
VLDL Cholesterol Cal: 20 mg/dL (ref 5–40)

## 2024-06-07 LAB — VITAMIN D 25 HYDROXY (VIT D DEFICIENCY, FRACTURES): Vit D, 25-Hydroxy: 47.2 ng/mL (ref 30.0–100.0)

## 2024-06-07 LAB — VITAMIN B12: Vitamin B-12: 809 pg/mL (ref 232–1245)

## 2024-06-07 LAB — HEMOGLOBIN A1C
Est. average glucose Bld gHb Est-mCnc: 114 mg/dL
Hgb A1c MFr Bld: 5.6 % (ref 4.8–5.6)

## 2024-06-26 ENCOUNTER — Ambulatory Visit
Admission: RE | Admit: 2024-06-26 | Discharge: 2024-06-26 | Disposition: A | Discharge status: Home / Self Care | Source: Ambulatory Visit | Attending: Family Medicine | Admitting: Family Medicine

## 2024-06-26 DIAGNOSIS — Z1231 Encounter for screening mammogram for malignant neoplasm of breast: Secondary | ICD-10-CM | POA: Diagnosis not present

## 2024-07-14 ENCOUNTER — Other Ambulatory Visit: Payer: Self-pay | Admitting: Family Medicine

## 2024-07-14 DIAGNOSIS — I150 Renovascular hypertension: Secondary | ICD-10-CM

## 2024-07-17 NOTE — Telephone Encounter (Signed)
 Requested Prescriptions  Pending Prescriptions Disp Refills   losartan  (COZAAR ) 25 MG tablet [Pharmacy Med Name: Losartan  Potassium 25 MG Oral Tablet] 90 tablet 1    Sig: TAKE 1 TABLET BY MOUTH DAILY     Cardiovascular:  Angiotensin Receptor Blockers Passed - 07/17/2024  4:41 PM      Passed - Cr in normal range and within 180 days    Creat  Date Value Ref Range Status  10/12/2017 1.01 0.50 - 1.05 mg/dL Final    Comment:    For patients >61 years of age, the reference limit for Creatinine is approximately 13% higher for people identified as African-American. .    Creatinine, Ser  Date Value Ref Range Status  06/06/2024 0.91 0.57 - 1.00 mg/dL Final         Passed - K in normal range and within 180 days    Potassium  Date Value Ref Range Status  06/06/2024 4.7 3.5 - 5.2 mmol/L Final         Passed - Patient is not pregnant      Passed - Last BP in normal range    BP Readings from Last 1 Encounters:  06/06/24 116/76         Passed - Valid encounter within last 6 months    Recent Outpatient Visits           1 month ago Encounter for annual physical exam   [facility] [place], Jon HERO, MD   3 months ago Acute bronchitis, unspecified organism   [facility], Jon HERO, MD       Future Appointments             In 1 month Hester Alm BROCKS, MD [facility]

## 2024-08-01 ENCOUNTER — Ambulatory Visit: Payer: BC Managed Care – PPO | Admitting: Dermatology

## 2024-08-07 ENCOUNTER — Ambulatory Visit: Admitting: Dermatology

## 2024-08-07 DIAGNOSIS — Z79899 Other long term (current) drug therapy: Secondary | ICD-10-CM

## 2024-08-07 DIAGNOSIS — L570 Actinic keratosis: Secondary | ICD-10-CM

## 2024-08-07 DIAGNOSIS — L814 Other melanin hyperpigmentation: Secondary | ICD-10-CM

## 2024-08-07 DIAGNOSIS — L578 Other skin changes due to chronic exposure to nonionizing radiation: Secondary | ICD-10-CM | POA: Diagnosis not present

## 2024-08-07 DIAGNOSIS — Z719 Counseling, unspecified: Secondary | ICD-10-CM

## 2024-08-07 DIAGNOSIS — L821 Other seborrheic keratosis: Secondary | ICD-10-CM

## 2024-08-07 DIAGNOSIS — L820 Inflamed seborrheic keratosis: Secondary | ICD-10-CM | POA: Diagnosis not present

## 2024-08-07 DIAGNOSIS — W908XXA Exposure to other nonionizing radiation, initial encounter: Secondary | ICD-10-CM

## 2024-08-07 DIAGNOSIS — Z7189 Other specified counseling: Secondary | ICD-10-CM

## 2024-08-07 DIAGNOSIS — Z5111 Encounter for antineoplastic chemotherapy: Secondary | ICD-10-CM

## 2024-08-07 NOTE — Progress Notes (Signed)
 Follow-Up Visit   Subjective  Natalie Dickerson is a 63 y.o. female who presents for the following: white spots at cheeks, rough spot at right lower leg, itchy spots at back, some spots at forehead Spot at left side of nose  The patient has spots, moles and lesions to be evaluated, some may be new or changing and the patient may have concern these could be cancer.  The following portions of the chart were reviewed this encounter and updated as appropriate: medications, allergies, medical history  Review of Systems:  No other skin or systemic complaints except as noted in HPI or Assessment and Plan.  Objective  Well appearing patient in no apparent distress; mood and affect are within normal limits.  A focused examination was performed of the following areas: Back, right lower leg, face   Relevant exam findings are noted in the Assessment and Plan.  forehead x 2  , right pretibia x 2 , right low back x 1 (5) Erythematous stuck-on, waxy papule or plaque face x 11 (11) Erythematous thin papules/macules with gritty scale.   Assessment & Plan   SEBORRHEIC KERATOSIS - Stuck-on, waxy, tan-brown papules and/or plaques  - Benign-appearing - Discussed benign etiology and prognosis. - Observe - Call for any changes  LENTIGINES Exam: scattered tan macules Due to sun exposure Treatment Plan: Benign-appearing, observe. Recommend daily broad spectrum sunscreen SPF 30+ to sun-exposed areas, reapply every 2 hours as needed.  Call for any changes  INFLAMED SEBORRHEIC KERATOSIS (5) forehead x 2  , right pretibia x 2 , right low back x 1 (5) Symptomatic, irritating, patient would like treated. Destruction of lesion - forehead x 2  , right pretibia x 2 , right low back x 1 (5) Complexity: simple   Destruction method: cryotherapy   Informed consent: discussed and consent obtained   Timeout:  patient name, date of birth, surgical site, and procedure verified Lesion destroyed using liquid  nitrogen: Yes   Region frozen until ice ball extended beyond lesion: Yes   Outcome: patient tolerated procedure well with no complications   Post-procedure details: wound care instructions given    ACTINIC KERATOSIS (11) face x 11 (11)   Recommend starting cream in late September  - Start 5-fluorouracil /calcipotriene cream twice a day for 7 days as a spot treatment to areas on face. Prescription sent to [facility]. Patient advised they will receive an email to purchase the medication online and have it sent to their home. Patient provided with handout reviewing treatment course and side effects and advised to call or message us  on MyChart with any concerns.  Reviewed course of treatment and expected reaction.  Patient advised to expect inflammation and crusting and advised that erosions are possible.  Patient advised to be diligent with sun protection during and after treatment. Counseled to keep medication out of reach of children and pets.  ACTINIC DAMAGE WITH PRECANCEROUS ACTINIC KERATOSES Counseling for Topical Chemotherapy Management: Patient exhibits: - Severe, confluent actinic changes with pre-cancerous actinic keratoses that is secondary to cumulative UV radiation exposure over time - Condition that is severe; chronic, not at goal. - diffuse scaly erythematous macules and papules with underlying dyspigmentation - Discussed Prescription Field Treatment topical Chemotherapy for Severe, Chronic Confluent Actinic Changes with Pre-Cancerous Actinic Keratoses Field treatment involves treatment of an entire area of skin that has confluent Actinic Changes (Sun/ Ultraviolet light damage) and PreCancerous Actinic Keratoses by method of PhotoDynamic Therapy (PDT) and/or prescription Topical Chemotherapy agents  such as 5-fluorouracil , 5-fluorouracil /calcipotriene, and/or imiquimod.  The purpose is to decrease the number of clinically evident and subclinical PreCancerous  lesions to prevent progression to development of skin cancer by chemically destroying early precancer changes that may or may not be visible.  It has been shown to reduce the risk of developing skin cancer in the treated area. As a result of treatment, redness, scaling, crusting, and open sores may occur during treatment course. One or more than one of these methods may be used and may have to be used several times to control, suppress and eliminate the PreCancerous changes. Discussed treatment course, expected reaction, and possible side effects. - Recommend daily broad spectrum sunscreen SPF 30+ to sun-exposed areas, reapply every 2 hours as needed.  - Staying in the shade or wearing long sleeves, sun glasses (UVA+UVB protection) and wide brim hats (4-inch brim around the entire circumference of the hat) are also recommended. - Call for new or changing lesions.  Destruction of lesion - face x 11 (11) Complexity: simple   Destruction method: cryotherapy   Informed consent: discussed and consent obtained   Timeout:  patient name, date of birth, surgical site, and procedure verified Lesion destroyed using liquid nitrogen: Yes   Region frozen until ice ball extended beyond lesion: Yes   Outcome: patient tolerated procedure well with no complications   Post-procedure details: wound care instructions given     Return for keep follow up as scheduled in september .  IEleanor Blush, CMA, am acting as scribe for Alm Rhyme, MD.   Documentation: I have reviewed the above documentation for accuracy and completeness, and I agree with the above.  Alm Rhyme, MD

## 2024-08-07 NOTE — Patient Instructions (Addendum)
 In late September start - Start 5-fluorouracil /calcipotriene cream twice a day to any spots on face for 7 days . Prescription sent to [facility]. Patient advised they will receive an email to purchase the medication online and have it sent to their home. Patient provided with handout reviewing treatment course and side effects and advised to call or message us  on MyChart with any concerns.  Reviewed course of treatment and expected reaction.  Patient advised to expect inflammation and crusting and advised that erosions are possible.  Patient advised to be diligent with sun protection during and after treatment. Counseled to keep medication out of reach of children and pets.  Instructions for [facility]  One or more of your medications was sent to the [facility] mail order [facility] pharmacy. You will receive an email from them and can purchase the medicine through that link. It will then be mailed to your home at the address you confirmed. If for any reason you do not receive an email from them, please check your spam folder. If you still do not find the email, please let us  know. [facility] phone number is [phone].    5-Fluorouracil /Calcipotriene Patient Education   Actinic keratoses are the dry, red scaly spots on the skin caused by sun damage. A portion of these spots can turn into skin cancer with time, and treating them can help prevent development of skin cancer.   Treatment of these spots requires removal of the defective skin cells. There are various ways to remove actinic keratoses, including freezing with liquid nitrogen, treatment with creams, or treatment with a blue light procedure in the office.   5-fluorouracil  cream is a topical cream used to treat actinic keratoses. It works by interfering with the growth of abnormal fast-growing skin cells, such as actinic keratoses. These cells peel off and are replaced by healthy  ones.   5-fluorouracil /calcipotriene is a combination of the 5-fluorouracil  cream with a vitamin D  analog cream called calcipotriene. The calcipotriene alone does not treat actinic keratoses. However, when it is combined with 5-fluorouracil , it helps the 5-fluorouracil  treat the actinic keratoses much faster so that the same results can be achieved with a much shorter treatment time.  INSTRUCTIONS FOR 5-FLUOROURACIL /CALCIPOTRIENE CREAM:   5-fluorouracil /calcipotriene cream typically only needs to be used for 4-7 days. A thin layer should be applied twice a day to the treatment areas recommended by your physician.   If your physician prescribed you separate tubes of 5-fluourouracil and calcipotriene, apply a thin layer of 5-fluorouracil  followed by a thin layer of calcipotriene.   Avoid contact with your eyes, nostrils, and mouth. Do not use 5-fluorouracil /calcipotriene cream on infected or open wounds.   You will develop redness, irritation and some crusting at areas where you have pre-cancer damage/actinic keratoses. IF YOU DEVELOP PAIN, BLEEDING, OR SIGNIFICANT CRUSTING, STOP THE TREATMENT EARLY - you have already gotten a good response and the actinic keratoses should clear up well.  Wash your hands after applying 5-fluorouracil  5% cream on your skin.   A moisturizer or sunscreen with a minimum SPF 30 should be applied each morning.   Once you have finished the treatment, you can apply a thin layer of Vaseline twice a day to irritated areas to soothe and calm the areas more quickly. If you experience significant discomfort, contact your physician.  For some patients it is necessary to repeat the treatment for best results.  SIDE EFFECTS: When using 5-fluorouracil /calcipotriene cream, you may have mild  irritation, such as redness, dryness, swelling, or a mild burning sensation. This usually resolves within 2 weeks. The more actinic keratoses you have, the more redness and inflammation you can  expect during treatment. Eye irritation has been reported rarely. If this occurs, please let us  know.  If you have any trouble using this cream, please call the office. If you have any other questions about this information, please do not hesitate to ask me before you leave the office.   Actinic keratoses are precancerous spots that appear secondary to cumulative UV radiation exposure/sun exposure over time. They are chronic with expected duration over 1 year. A portion of actinic keratoses will progress to squamous cell carcinoma of the skin. It is not possible to reliably predict which spots will progress to skin cancer and so treatment is recommended to prevent development of skin cancer.  Recommend daily broad spectrum sunscreen SPF 30+ to sun-exposed areas, reapply every 2 hours as needed.  Recommend staying in the shade or wearing long sleeves, sun glasses (UVA+UVB protection) and wide brim hats (4-inch brim around the entire circumference of the hat). Call for new or changing lesions.    Seborrheic Keratosis  What causes seborrheic keratoses? Seborrheic keratoses are harmless, common skin growths that first appear during adult life.  As time goes by, more growths appear.  Some people may develop a large number of them.  Seborrheic keratoses appear on both covered and uncovered body parts.  They are not caused by sunlight.  The tendency to develop seborrheic keratoses can be inherited.  They vary in color from skin-colored to gray, brown, or even black.  They can be either smooth or have a rough, warty surface.   Seborrheic keratoses are superficial and look as if they were stuck on the skin.  Under the microscope this type of keratosis looks like layers upon layers of skin.  That is why at times the top layer may seem to fall off, but the rest of the growth remains and re-grows.    Treatment Seborrheic keratoses do not need to be treated, but can easily be removed in the office.  Seborrheic  keratoses often cause symptoms when they rub on clothing or jewelry.  Lesions can be in the way of shaving.  If they become inflamed, they can cause itching, soreness, or burning.  Removal of a seborrheic keratosis can be accomplished by freezing, burning, or surgery. If any spot bleeds, scabs, or grows rapidly, please return to have it checked, as these can be an indication of a skin cancer.  Cryotherapy Aftercare  Wash gently with soap and water everyday.   Apply Vaseline and Band-Aid daily until healed.     Due to recent changes in healthcare laws, you may see results of your pathology and/or laboratory studies on MyChart before the doctors have had a chance to review them. We understand that in some cases there may be results that are confusing or concerning to you. Please understand that not all results are received at the same time and often the doctors may need to interpret multiple results in order to provide you with the best plan of care or course of treatment. Therefore, we ask that you please give us  2 business days to thoroughly review all your results before contacting the office for clarification. Should we see a critical lab result, you will be contacted sooner.   If You Need Anything After Your Visit  If you have any questions or concerns for your doctor,  please call our main line at [phone] and press option 4 to reach your doctor's medical assistant. If no one answers, please leave a voicemail as directed and we will return your call as soon as possible. Messages left after 4 pm will be answered the following business day.   You may also send us  a message via MyChart. We typically respond to MyChart messages within 1-2 business days.  For prescription refills, please ask your pharmacy to contact our office. Our fax number is [phone].  If you have an urgent issue when the clinic is closed that cannot wait until the next business day, you can page your doctor at the  number below.    Please note that while we do our best to be available for urgent issues outside of office hours, we are not available 24/7.   If you have an urgent issue and are unable to reach us , you may choose to seek medical care at your doctor's office, retail clinic, urgent care center, or emergency room.  If you have a medical emergency, please immediately call 911 or go to the emergency department.  Pager Numbers  - Dr. Hester: [phone]  - Dr. Jackquline: [phone]  - Dr. Claudene: [phone]   In the event of inclement weather, please call our main line at [phone] for an update on the status of any delays or closures.  Dermatology Medication Tips: Please keep the boxes that topical medications come in in order to help keep track of the instructions about where and how to use these. Pharmacies typically print the medication instructions only on the boxes and not directly on the medication tubes.   If your medication is too expensive, please contact our office at [phone] option 4 or send us  a message through MyChart.   We are unable to tell what your co-pay for medications will be in advance as this is different depending on your insurance coverage. However, we may be able to find a substitute medication at lower cost or fill out paperwork to get insurance to cover a needed medication.   If a prior authorization is required to get your medication covered by your insurance company, please allow us  1-2 business days to complete this process.  Drug prices often vary depending on where the prescription is filled and some pharmacies may offer cheaper prices.  The website www.goodrx.com contains coupons for medications through different pharmacies. The prices here do not account for what the cost may be with help from insurance (it may be cheaper with your insurance), but the website can give you the price if you did not use any insurance.  - You can print the  associated coupon and take it with your prescription to the pharmacy.  - You may also stop by our office during regular business hours and pick up a GoodRx coupon card.  - If you need your prescription sent electronically to a different pharmacy, notify our office through [facility] or by phone at [phone] option 4.     Si Usted Necesita Algo Despus de Su Visita  Tambin puede enviarnos un mensaje a travs de Clinical cytogeneticist. Por lo general respondemos a los mensajes de MyChart en el transcurso de 1 a 2 das hbiles.  Para renovar recetas, por favor pida a su farmacia que se ponga en contacto con nuestra oficina. Randi lakes de fax es [place] [phone].  Si tiene un asunto urgente cuando la clnica est cerrada y que no puede esperar hasta el siguiente  da hbil, puede llamar/localizar a su doctor(a) al nmero que aparece a continuacin.   Por favor, tenga en cuenta que aunque hacemos todo lo posible para estar disponibles para asuntos urgentes fuera del horario de [place], no estamos disponibles las 24 horas del da, los 7 [address]  [address] de la [place].   Si tiene un problema urgente y no puede comunicarse con nosotros, puede optar por buscar atencin mdica  en el consultorio de su doctor(a), en una clnica privada, en un centro de atencin urgente o en una sala de emergencias.  Si tiene Engineer, drilling, por favor llame inmediatamente al 911 o vaya a la sala de emergencias.  Nmeros de bper  - Dr. Hester: [phone]  - Dra. Jackquline: [phone]  - Dr. Claudene: [phone]   En caso de inclemencias del tiempo, por favor llame a landry capes principal al [phone] para una actualizacin sobre el [place] de cualquier retraso o cierre.  Consejos para la medicacin en dermatologa: Por favor, guarde las cajas en las que vienen los medicamentos de uso tpico para ayudarle a seguir las instrucciones sobre dnde y cmo usarlos. Las farmacias generalmente imprimen las instrucciones  del medicamento slo en las cajas y no directamente en los tubos del [place].   Si su medicamento es muy caro, por favor, pngase en contacto con landry rieger llamando al [phone] y presione la opcin 4 o envenos un mensaje a travs de Clinical cytogeneticist.   No podemos decirle cul ser su copago por los medicamentos por adelantado ya que esto es diferente dependiendo de la cobertura de su seguro. Sin embargo, es posible que podamos encontrar un medicamento sustituto a Audiological scientist un formulario para que el seguro cubra el medicamento que se considera necesario.   Si se requiere una autorizacin previa para que su compaa de seguros malta su medicamento, por favor permtanos de 1 a 2 das hbiles para completar este proceso.  Los precios de los medicamentos varan con frecuencia dependiendo del Environmental consultant de dnde se surte la receta y alguna farmacias pueden ofrecer precios ms baratos.  El sitio web www.goodrx.com tiene cupones para medicamentos de Health and safety inspector. Los precios aqu no tienen en cuenta lo que podra costar con la ayuda del seguro (puede ser ms barato con su seguro), pero el sitio web puede darle el precio si no utiliz Tourist information centre manager.  - Puede imprimir el cupn correspondiente y llevarlo con su receta a la farmacia.  - Tambin puede pasar por nuestra oficina durante el horario de atencin regular y Education officer, museum una tarjeta de cupones de GoodRx.  - Si necesita que su receta se enve electrnicamente a una farmacia diferente, informe a nuestra oficina a travs de MyChart de [place] o por telfono llamando al [phone] y presione la opcin 4.

## 2024-08-08 ENCOUNTER — Encounter: Payer: Self-pay | Admitting: Dermatology

## 2024-08-30 ENCOUNTER — Ambulatory Visit: Payer: BC Managed Care – PPO | Admitting: Dermatology

## 2024-08-30 DIAGNOSIS — D1801 Hemangioma of skin and subcutaneous tissue: Secondary | ICD-10-CM

## 2024-08-30 DIAGNOSIS — Z1283 Encounter for screening for malignant neoplasm of skin: Secondary | ICD-10-CM | POA: Diagnosis not present

## 2024-08-30 DIAGNOSIS — Z85828 Personal history of other malignant neoplasm of skin: Secondary | ICD-10-CM

## 2024-08-30 DIAGNOSIS — Z79899 Other long term (current) drug therapy: Secondary | ICD-10-CM

## 2024-08-30 DIAGNOSIS — W908XXA Exposure to other nonionizing radiation, initial encounter: Secondary | ICD-10-CM | POA: Diagnosis not present

## 2024-08-30 DIAGNOSIS — L570 Actinic keratosis: Secondary | ICD-10-CM | POA: Diagnosis not present

## 2024-08-30 DIAGNOSIS — L578 Other skin changes due to chronic exposure to nonionizing radiation: Secondary | ICD-10-CM

## 2024-08-30 DIAGNOSIS — I781 Nevus, non-neoplastic: Secondary | ICD-10-CM | POA: Diagnosis not present

## 2024-08-30 DIAGNOSIS — Z7189 Other specified counseling: Secondary | ICD-10-CM

## 2024-08-30 DIAGNOSIS — L820 Inflamed seborrheic keratosis: Secondary | ICD-10-CM

## 2024-08-30 DIAGNOSIS — Z5111 Encounter for antineoplastic chemotherapy: Secondary | ICD-10-CM

## 2024-08-30 NOTE — Progress Notes (Unsigned)
 Follow-Up Visit   Subjective  Natalie Dickerson is a 63 y.o. female who presents for the following: Skin Cancer Screening and Full Body Skin Exam Hx of bcc, hx of aks and isks,   Has not started the 5 f/u field treatment cream to areas on face yet still has some up coming vacations   The patient presents for Total-Body Skin Exam (TBSE) for skin cancer screening and mole check. The patient has spots, moles and lesions to be evaluated, some may be new or changing and the patient may have concern these could be cancer.  The following portions of the chart were reviewed this encounter and updated as appropriate: medications, allergies, medical history  Review of Systems:  No other skin or systemic complaints except as noted in HPI or Assessment and Plan.  Objective  Well appearing patient in no apparent distress; mood and affect are within normal limits.  A full examination was performed including scalp, head, eyes, ears, nose, lips, neck, chest, axillae, abdomen, back, buttocks, bilateral upper extremities, bilateral lower extremities, hands, feet, fingers, toes, fingernails, and toenails. All findings within normal limits unless otherwise noted below.   Relevant physical exam findings are noted in the Assessment and Plan.  face x 3 (3) Erythematous thin papules/macules with gritty scale.  left posterior thigh x 1, chest x 1 (2) Erythematous stuck-on, waxy papule or plaque  Assessment & Plan   SKIN CANCER SCREENING PERFORMED TODAY.   LENTIGINES, SEBORRHEIC KERATOSES, HEMANGIOMAS - Benign normal skin lesions - Benign-appearing - Call for any changes  Hemangioma at right proximal thigh  Counseling for BBL / IPL / Laser and Coordination of Care Discussed the treatment option of Broad Band Light (BBL) /Intense Pulsed Light (IPL)/ Laser for skin discoloration, including brown spots and redness.  Typically we recommend at least 1-3 treatment sessions about 5-8 weeks apart for best  results.  Cannot have tanned skin when BBL performed, and regular use of sunscreen/photoprotection is advised after the procedure to help maintain results. The patient's condition may also require maintenance treatments in the future.  The fee for BBL / laser treatments is $350 per treatment session for the whole face.  A fee can be quoted for other parts of the body.  Insurance typically does not pay for BBL/laser treatments and therefore the fee is an out-of-pocket cost. Recommend prophylactic valtrex treatment. Once scheduled for procedure, will send Rx in prior to patient's appointment.    MELANOCYTIC NEVI - Tan-brown and/or pink-flesh-colored symmetric macules and papules - Benign appearing on exam today - Observation - Call clinic for new or changing moles - Recommend daily use of broad spectrum spf 30+ sunscreen to sun-exposed areas.   TELANGIECTASIA Exam: dilated blood vessel(s) at right ankle Left lateral nasal bridge  Treatment Plan: Benign appearing on exam Call for changes   HISTORY OF BASAL CELL CARCINOMA OF THE SKIN 12/06/2013 left superior forehead - No evidence of recurrence today - Recommend regular full body skin exams - Recommend daily broad spectrum sunscreen SPF 30+ to sun-exposed areas, reapply every 2 hours as needed.  - Call if any new or changing lesions are noted between office visits   ACTINIC KERATOSIS (3) face x 3 (3) ACTINIC DAMAGE WITH PRECANCEROUS ACTINIC KERATOSES Counseling for Topical Chemotherapy Management: Patient exhibits: - Severe, confluent actinic changes with pre-cancerous actinic keratoses that is secondary to cumulative UV radiation exposure over time - Condition that is severe; chronic, not at goal. - diffuse scaly erythematous macules and papules  with underlying dyspigmentation - Discussed Prescription Field Treatment topical Chemotherapy for Severe, Chronic Confluent Actinic Changes with Pre-Cancerous Actinic Keratoses Field  treatment involves treatment of an entire area of skin that has confluent Actinic Changes (Sun/ Ultraviolet light damage) and PreCancerous Actinic Keratoses by method of PhotoDynamic Therapy (PDT) and/or prescription Topical Chemotherapy agents such as 5-fluorouracil , 5-fluorouracil /calcipotriene, and/or imiquimod.  The purpose is to decrease the number of clinically evident and subclinical PreCancerous lesions to prevent progression to development of skin cancer by chemically destroying early precancer changes that may or may not be visible.  It has been shown to reduce the risk of developing skin cancer in the treated area. As a result of treatment, redness, scaling, crusting, and open sores may occur during treatment course. One or more than one of these methods may be used and may have to be used several times to control, suppress and eliminate the PreCancerous changes. Discussed treatment course, expected reaction, and possible side effects. - Recommend daily broad spectrum sunscreen SPF 30+ to sun-exposed areas, reapply every 2 hours as needed.  - Staying in the shade or wearing long sleeves, sun glasses (UVA+UVB protection) and wide brim hats (4-inch brim around the entire circumference of the hat) are also recommended. - Call for new or changing lesions.  Patient will start in [facility] October  5-fluorouracil /calcipotriene cream twice a day for 7 days as a spot treatment to areas on face    Prescription sent to [facility]. Patient advised they will receive an email to purchase the medication online and have it sent to their home. Patient provided with handout reviewing treatment course and side effects and advised to call or message us  on MyChart with any concerns.  Reviewed course of treatment and expected reaction.  Patient advised to expect inflammation and crusting and advised that erosions are possible.  Patient advised to be diligent with sun protection during and after  treatment. Counseled to keep medication out of reach of children and pets.  Actinic keratoses are precancerous spots that appear secondary to cumulative UV radiation exposure/sun exposure over time. They are chronic with expected duration over 1 year. A portion of actinic keratoses will progress to squamous cell carcinoma of the skin. It is not possible to reliably predict which spots will progress to skin cancer and so treatment is recommended to prevent development of skin cancer.  Recommend daily broad spectrum sunscreen SPF 30+ to sun-exposed areas, reapply every 2 hours as needed.  Recommend staying in the shade or wearing long sleeves, sun glasses (UVA+UVB protection) and wide brim hats (4-inch brim around the entire circumference of the hat). Call for new or changing lesions. Destruction of lesion - face x 3 (3) Complexity: simple   Destruction method: cryotherapy   Informed consent: discussed and consent obtained   Timeout:  patient name, date of birth, surgical site, and procedure verified Lesion destroyed using liquid nitrogen: Yes   Region frozen until ice ball extended beyond lesion: Yes   Outcome: patient tolerated procedure well with no complications   Post-procedure details: wound care instructions given    INFLAMED SEBORRHEIC KERATOSIS (2) left posterior thigh x 1, chest x 1 (2) Symptomatic, irritating, patient would like treated. Destruction of lesion - left posterior thigh x 1, chest x 1 (2) Complexity: simple   Destruction method: cryotherapy   Informed consent: discussed and consent obtained   Timeout:  patient name, date of birth, surgical site, and procedure verified Lesion destroyed using liquid nitrogen: Yes  Region frozen until ice ball extended beyond lesion: Yes   Outcome: patient tolerated procedure well with no complications   Post-procedure details: wound care instructions given    Return in about 6 months (around 02/27/2025) for TBSE.  IEleanor Blush,  CMA, am acting as scribe for Alm Rhyme, MD.   Documentation: I have reviewed the above documentation for accuracy and completeness, and I agree with the above.  Alm Rhyme, MD

## 2024-08-30 NOTE — Patient Instructions (Addendum)
 Actinic keratoses are precancerous spots that appear secondary to cumulative UV radiation exposure/sun exposure over time. They are chronic with expected duration over 1 year. A portion of actinic keratoses will progress to squamous cell carcinoma of the skin. It is not possible to reliably predict which spots will progress to skin cancer and so treatment is recommended to prevent development of skin cancer.  Recommend daily broad spectrum sunscreen SPF 30+ to sun-exposed areas, reapply every 2 hours as needed.  Recommend staying in the shade or wearing long sleeves, sun glasses (UVA+UVB protection) and wide brim hats (4-inch brim around the entire circumference of the hat). Call for new or changing lesions.     Cryotherapy Aftercare  Wash gently with soap and water everyday.   Apply Vaseline and Band-Aid daily until healed.    Seborrheic Keratosis  What causes seborrheic keratoses? Seborrheic keratoses are harmless, common skin growths that first appear during adult life.  As time goes by, more growths appear.  Some people may develop a large number of them.  Seborrheic keratoses appear on both covered and uncovered body parts.  They are not caused by sunlight.  The tendency to develop seborrheic keratoses can be inherited.  They vary in color from skin-colored to gray, brown, or even black.  They can be either smooth or have a rough, warty surface.   Seborrheic keratoses are superficial and look as if they were stuck on the skin.  Under the microscope this type of keratosis looks like layers upon layers of skin.  That is why at times the top layer may seem to fall off, but the rest of the growth remains and re-grows.    Treatment Seborrheic keratoses do not need to be treated, but can easily be removed in the office.  Seborrheic keratoses often cause symptoms when they rub on clothing or jewelry.  Lesions can be in the way of shaving.  If they become inflamed, they can cause itching, soreness,  or burning.  Removal of a seborrheic keratosis can be accomplished by freezing, burning, or surgery. If any spot bleeds, scabs, or grows rapidly, please return to have it checked, as these can be an indication of a skin cancer.   Melanoma ABCDEs  Melanoma is the most dangerous type of skin cancer, and is the leading cause of death from skin disease.  You are more likely to develop melanoma if you: Have light-colored skin, light-colored eyes, or red or blond hair Spend a lot of time in the sun Tan regularly, either outdoors or in a tanning bed Have had blistering sunburns, especially during childhood Have a close family member who has had a melanoma Have atypical moles or large birthmarks  Early detection of melanoma is key since treatment is typically straightforward and cure rates are extremely high if we catch it early.   The first sign of melanoma is often a change in a mole or a new dark spot.  The ABCDE system is a way of remembering the signs of melanoma.  A for asymmetry:  The two halves do not match. B for border:  The edges of the growth are irregular. C for color:  A mixture of colors are present instead of an even brown color. D for diameter:  Melanomas are usually (but not always) greater than 6mm - the size of a pencil eraser. E for evolution:  The spot keeps changing in size, shape, and color.  Please check your skin once per month between visits. You can  use a small mirror in front and a large mirror behind you to keep an eye on the back side or your body.   If you see any new or changing lesions before your next follow-up, please call to schedule a visit.  Please continue daily skin protection including broad spectrum sunscreen SPF 30+ to sun-exposed areas, reapplying every 2 hours as needed when you're outdoors.   Staying in the shade or wearing long sleeves, sun glasses (UVA+UVB protection) and wide brim hats (4-inch brim around the entire circumference of the hat) are  also recommended for sun protection.    Due to recent changes in healthcare laws, you may see results of your pathology and/or laboratory studies on MyChart before the doctors have had a chance to review them. We understand that in some cases there may be results that are confusing or concerning to you. Please understand that not all results are received at the same time and often the doctors may need to interpret multiple results in order to provide you with the best plan of care or course of treatment. Therefore, we ask that you please give us  2 business days to thoroughly review all your results before contacting the office for clarification. Should we see a critical lab result, you will be contacted sooner.   If You Need Anything After Your Visit  If you have any questions or concerns for your doctor, please call our main line at [phone] and press option 4 to reach your doctor's medical assistant. If no one answers, please leave a voicemail as directed and we will return your call as soon as possible. Messages left after 4 pm will be answered the following business day.   You may also send us  a message via MyChart. We typically respond to MyChart messages within 1-2 business days.  For prescription refills, please ask your pharmacy to contact our office. Our fax number is [phone].  If you have an urgent issue when the clinic is closed that cannot wait until the next business day, you can page your doctor at the number below.    Please note that while we do our best to be available for urgent issues outside of office hours, we are not available 24/7.   If you have an urgent issue and are unable to reach us , you may choose to seek medical care at your doctor's office, retail clinic, urgent care center, or emergency room.  If you have a medical emergency, please immediately call 911 or go to the emergency department.  Pager Numbers  - Dr. Hester: [phone]  - Dr. Jackquline:  [phone]  - Dr. Claudene: [phone]   - Dr. Raymund: [phone]  In the event of inclement weather, please call our main line at [phone] for an update on the status of any delays or closures.  Dermatology Medication Tips: Please keep the boxes that topical medications come in in order to help keep track of the instructions about where and how to use these. Pharmacies typically print the medication instructions only on the boxes and not directly on the medication tubes.   If your medication is too expensive, please contact our office at [phone] option 4 or send us  a message through MyChart.   We are unable to tell what your co-pay for medications will be in advance as this is different depending on your insurance coverage. However, we may be able to find a substitute medication at lower cost or fill out paperwork to get insurance to cover  a needed medication.   If a prior authorization is required to get your medication covered by your insurance company, please allow us  1-2 business days to complete this process.  Drug prices often vary depending on where the prescription is filled and some pharmacies may offer cheaper prices.  The website www.goodrx.com contains coupons for medications through different pharmacies. The prices here do not account for what the cost may be with help from insurance (it may be cheaper with your insurance), but the website can give you the price if you did not use any insurance.  - You can print the associated coupon and take it with your prescription to the pharmacy.  - You may also stop by our office during regular business hours and pick up a GoodRx coupon card.  - If you need your prescription sent electronically to a different pharmacy, notify our office through [facility] or by phone at [phone] option 4.     Si Usted Necesita Algo Despus de Su Visita  Tambin puede enviarnos un mensaje a travs de Clinical cytogeneticist. Por lo general  respondemos a los mensajes de MyChart en el transcurso de 1 a 2 das hbiles.  Para renovar recetas, por favor pida a su farmacia que se ponga en contacto con nuestra oficina. Randi lakes de fax es [place] [phone].  Si tiene un asunto urgente cuando la clnica est cerrada y que no puede esperar hasta el siguiente da hbil, puede llamar/localizar a su doctor(a) al nmero que aparece a continuacin.   Por favor, tenga en cuenta que aunque hacemos todo lo posible para estar disponibles para asuntos urgentes fuera del horario de [place], no estamos disponibles las 24 horas del da, los 7 [address]  [address] de la [place].   Si tiene un problema urgente y no puede comunicarse con nosotros, puede optar por buscar atencin mdica  en el consultorio de su doctor(a), en una clnica privada, en un centro de atencin urgente o en una sala de emergencias.  Si tiene Engineer, drilling, por favor llame inmediatamente al 911 o vaya a la sala de emergencias.  Nmeros de bper  - Dr. Hester: [phone]  - Dra. Jackquline: [phone]  - Dr. Claudene: [phone]  - Dra. Kitts: [phone]  En caso de inclemencias del [place], por favor llame a nuestra lnea principal al [phone] para una actualizacin sobre el estado de cualquier retraso o cierre.  Consejos para la medicacin en dermatologa: Por favor, guarde las cajas en las que vienen los medicamentos de uso tpico para ayudarle a seguir las instrucciones sobre dnde y cmo usarlos. Las farmacias generalmente imprimen las instrucciones del medicamento slo en las cajas y no directamente en los tubos del [place].   Si su medicamento es muy caro, por favor, pngase en contacto con landry rieger llamando al [phone] y presione la opcin 4 o envenos un mensaje a travs de Clinical cytogeneticist.   No podemos decirle cul ser su copago por los medicamentos por adelantado ya que esto es diferente dependiendo de la cobertura de su seguro. Sin embargo, es posible  que podamos encontrar un medicamento sustituto a Audiological scientist un formulario para que el seguro cubra el medicamento que se considera necesario.   Si se requiere una autorizacin previa para que su compaa de seguros malta su medicamento, por favor permtanos de 1 a 2 das hbiles para completar este proceso.  Los precios de los medicamentos varan con frecuencia dependiendo del Environmental consultant de dnde se surte la receta y iraq  pueden ofrecer precios ms baratos.  El sitio web www.goodrx.com tiene cupones para medicamentos de Health and safety inspector. Los precios aqu no tienen en cuenta lo que podra costar con la ayuda del seguro (puede ser ms barato con su seguro), pero el sitio web puede darle el precio si no utiliz Tourist information centre manager.  - Puede imprimir el cupn correspondiente y llevarlo con su receta a la farmacia.  - Tambin puede pasar por nuestra oficina durante el horario de atencin regular y Education officer, museum una tarjeta de cupones de GoodRx.  - Si necesita que su receta se enve electrnicamente a una farmacia diferente, informe a nuestra oficina a travs de MyChart de [place] o por telfono llamando al [phone] y presione la opcin 4.

## 2024-08-31 ENCOUNTER — Encounter: Payer: Self-pay | Admitting: Dermatology

## 2024-09-15 DIAGNOSIS — H9041 Sensorineural hearing loss, unilateral, right ear, with unrestricted hearing on the contralateral side: Secondary | ICD-10-CM | POA: Diagnosis not present

## 2024-09-15 DIAGNOSIS — H6063 Unspecified chronic otitis externa, bilateral: Secondary | ICD-10-CM | POA: Diagnosis not present

## 2024-09-15 DIAGNOSIS — H6123 Impacted cerumen, bilateral: Secondary | ICD-10-CM | POA: Diagnosis not present

## 2024-09-15 DIAGNOSIS — H9313 Tinnitus, bilateral: Secondary | ICD-10-CM | POA: Diagnosis not present

## 2024-10-30 ENCOUNTER — Ambulatory Visit: Admitting: Dermatology

## 2024-10-30 DIAGNOSIS — L282 Other prurigo: Secondary | ICD-10-CM | POA: Diagnosis not present

## 2024-10-30 DIAGNOSIS — Z79899 Other long term (current) drug therapy: Secondary | ICD-10-CM

## 2024-10-30 DIAGNOSIS — L299 Pruritus, unspecified: Secondary | ICD-10-CM

## 2024-10-30 DIAGNOSIS — Z7189 Other specified counseling: Secondary | ICD-10-CM

## 2024-10-30 DIAGNOSIS — L509 Urticaria, unspecified: Secondary | ICD-10-CM

## 2024-10-30 DIAGNOSIS — L309 Dermatitis, unspecified: Secondary | ICD-10-CM

## 2024-10-30 NOTE — Patient Instructions (Addendum)
 For Hives (urticaria); dermatographism or Itch: Start non sedating antihistamine (either Allegra 180mg , or Claritin  10mg , or Zyrtec 10mg ) daily.  All these are non-prescription (Over the Counter).   Start out with 1 pill a day.   After a week if not improving may increase to 2 pills a day.   After another week if not improving may increase to 3 pills a day.   After another week if still not improving may take up to 4 pills a day. Stay at highest dose that keeps condition controlled, but only up to 4 pills a day. Stay at the controlling dose for at least 2 weeks. Contact office if taking 4 pills of antihistamine a day for at least 2 weeks without control of condition as other options may be available.       Due to recent changes in healthcare laws, you may see results of your pathology and/or laboratory studies on MyChart before the doctors have had a chance to review them. We understand that in some cases there may be results that are confusing or concerning to you. Please understand that not all results are received at the same time and often the doctors may need to interpret multiple results in order to provide you with the best plan of care or course of treatment. Therefore, we ask that you please give us  2 business days to thoroughly review all your results before contacting the office for clarification. Should we see a critical lab result, you will be contacted sooner.   If You Need Anything After Your Visit  If you have any questions or concerns for your doctor, please call our main line at [phone] and press option 4 to reach your doctor's medical assistant. If no one answers, please leave a voicemail as directed and we will return your call as soon as possible. Messages left after 4 pm will be answered the following business day.   You may also send us  a message via MyChart. We typically respond to MyChart messages within 1-2 business days.  For prescription refills, please ask  your pharmacy to contact our office. Our fax number is [phone].  If you have an urgent issue when the clinic is closed that cannot wait until the next business day, you can page your doctor at the number below.    Please note that while we do our best to be available for urgent issues outside of office hours, we are not available 24/7.   If you have an urgent issue and are unable to reach us , you may choose to seek medical care at your doctor's office, retail clinic, urgent care center, or emergency room.  If you have a medical emergency, please immediately call 911 or go to the emergency department.  Pager Numbers  - Dr. Hester: [phone]  - Dr. Jackquline: [phone]  - Dr. Claudene: [phone]   - Dr. Raymund: [phone]  In the event of inclement weather, please call our main line at [phone] for an update on the status of any delays or closures.  Dermatology Medication Tips: Please keep the boxes that topical medications come in in order to help keep track of the instructions about where and how to use these. Pharmacies typically print the medication instructions only on the boxes and not directly on the medication tubes.   If your medication is too expensive, please contact our office at [phone] option 4 or send us  a message through MyChart.   We are unable to tell what your co-pay  for medications will be in advance as this is different depending on your insurance coverage. However, we may be able to find a substitute medication at lower cost or fill out paperwork to get insurance to cover a needed medication.   If a prior authorization is required to get your medication covered by your insurance company, please allow us  1-2 business days to complete this process.  Drug prices often vary depending on where the prescription is filled and some pharmacies may offer cheaper prices.  The website www.goodrx.com contains coupons for medications through different  pharmacies. The prices here do not account for what the cost may be with help from insurance (it may be cheaper with your insurance), but the website can give you the price if you did not use any insurance.  - You can print the associated coupon and take it with your prescription to the pharmacy.  - You may also stop by our office during regular business hours and pick up a GoodRx coupon card.  - If you need your prescription sent electronically to a different pharmacy, notify our office through [facility] or by phone at [phone] option 4.     Si Usted Necesita Algo Despus de Su Visita  Tambin puede enviarnos un mensaje a travs de Clinical cytogeneticist. Por lo general respondemos a los mensajes de MyChart en el transcurso de 1 a 2 das hbiles.  Para renovar recetas, por favor pida a su farmacia que se ponga en contacto con nuestra oficina. Randi lakes de fax es [place] [phone].  Si tiene un asunto urgente cuando la clnica est cerrada y que no puede esperar hasta el siguiente da hbil, puede llamar/localizar a su doctor(a) al nmero que aparece a continuacin.   Por favor, tenga en cuenta que aunque hacemos todo lo posible para estar disponibles para asuntos urgentes fuera del horario de [place], no estamos disponibles las 24 horas del da, los 7 [address]  [address] de la [place].   Si tiene un problema urgente y no puede comunicarse con nosotros, puede optar por buscar atencin mdica  en el consultorio de su doctor(a), en una clnica privada, en un centro de atencin urgente o en una sala de emergencias.  Si tiene Engineer, drilling, por favor llame inmediatamente al 911 o vaya a la sala de emergencias.  Nmeros de bper  - Dr. Hester: [phone]  - Dra. Jackquline: [phone]  - Dr. Claudene: [phone]  - Dra. Kitts: [phone]  En caso de inclemencias del [place], por favor llame a nuestra lnea principal al [phone] para una actualizacin sobre el estado de cualquier retraso o  cierre.  Consejos para la medicacin en dermatologa: Por favor, guarde las cajas en las que vienen los medicamentos de uso tpico para ayudarle a seguir las instrucciones sobre dnde y cmo usarlos. Las farmacias generalmente imprimen las instrucciones del medicamento slo en las cajas y no directamente en los tubos del [place].   Si su medicamento es muy caro, por favor, pngase en contacto con landry rieger llamando al [phone] y presione la opcin 4 o envenos un mensaje a travs de Clinical cytogeneticist.   No podemos decirle cul ser su copago por los medicamentos por adelantado ya que esto es diferente dependiendo de la cobertura de su seguro. Sin embargo, es posible que podamos encontrar un medicamento sustituto a Audiological scientist un formulario para que el seguro cubra el medicamento que se considera necesario.   Si se requiere una autorizacin previa para que su compaa de seguros  cubra su medicamento, por favor permtanos de 1 a 2 das hbiles para completar [address]  Los precios de los medicamentos varan con frecuencia dependiendo del Environmental consultant de dnde se surte la receta y alguna farmacias pueden ofrecer precios ms baratos.  El sitio web www.goodrx.com tiene cupones para medicamentos de Health and safety inspector. Los precios aqu no tienen en cuenta lo que podra costar con la ayuda del seguro (puede ser ms barato con su seguro), pero el sitio web puede darle el precio si no utiliz Tourist information centre manager.  - Puede imprimir el cupn correspondiente y llevarlo con su receta a la farmacia.  - Tambin puede pasar por nuestra oficina durante el horario de atencin regular y Education officer, museum una tarjeta de cupones de GoodRx.  - Si necesita que su receta se enve electrnicamente a una farmacia diferente, informe a nuestra oficina a travs de MyChart de [place] o por telfono llamando al [phone] y presione la opcin 4.

## 2024-10-30 NOTE — Progress Notes (Unsigned)
   Follow-Up Visit   Subjective  Natalie Dickerson is a 63 y.o. female who presents for the following: Patient c/o itchy rash on her back arms, scalp and neck for ~ 1 month, tried otc products with a poor response. Patient report only change was her laundry Arm and hammer, she stopped this laundry detergent a few weeks ago. Patient report she has been under a lot of stress lately.   The following portions of the chart were reviewed this encounter and updated as appropriate: medications, allergies, medical history  Review of Systems:  No other skin or systemic complaints except as noted in HPI or Assessment and Plan.  Objective  Well appearing patient in no apparent distress; mood and affect are within normal limits.  A focused examination was performed of the following areas: face,back,chest,neck   Relevant exam findings are noted in the Assessment and Plan.         Assessment & Plan   PAPULAR URTICARIA  See photo Back,neck, chest-  red bumps that appear in clusters  Start non sedating antihistamine (either Allegra 180mg , or Claritin 10mg , or Zyrtec 10mg ) daily.  All these are non-prescription (Over the Counter).   Start out with 1 pill a day.   After a week if not improving may increase to 2 pills a day.   After another week if not improving may increase to 3 pills a day.   After another week if still not improving may take up to 4 pills a day. Stay at highest dose that keeps condition controlled, but only up to 4 pills a day. Stay at the controlling dose for at least 2 weeks. Contact office if taking 4 pills of antihistamine a day for at least 2 weeks without control of condition as other options may be available.   Can try Clotrimazole/Betamethasone  cream qd- bid prn (already has this cream)  If no better in 2 weeks patient instructed to send us  a MyChart message we may consider Rhapsido tablets  Dermatitis Ears  Exam: Mild scale  Treatment Plan: Continue  Clotrimazole/Betamethasone  cream qd-bid prn  as prescribed by her PCP    Return if symptoms worsen or fail to improve.  IFay Kirks, CMA, am acting as scribe for Alm Rhyme, MD .   Documentation: I have reviewed the above documentation for accuracy and completeness, and I agree with the above.  Alm Rhyme, MD

## 2024-11-01 ENCOUNTER — Encounter: Payer: Self-pay | Admitting: Dermatology

## 2024-11-13 ENCOUNTER — Ambulatory Visit

## 2024-11-13 DIAGNOSIS — L989 Disorder of the skin and subcutaneous tissue, unspecified: Secondary | ICD-10-CM

## 2024-11-13 DIAGNOSIS — L509 Urticaria, unspecified: Secondary | ICD-10-CM

## 2024-11-13 MED ORDER — CLOBETASOL PROPIONATE 0.05 % EX OINT: TOPICAL_OINTMENT | CUTANEOUS | 5 refills | Status: DC

## 2024-11-13 MED ORDER — CLOBETASOL PROPIONATE 0.05 % EX SHAM: MEDICATED_SHAMPOO | CUTANEOUS | 5 refills | Status: AC

## 2024-11-13 MED ORDER — TRIAMCINOLONE ACETONIDE 0.1 % EX OINT: TOPICAL_OINTMENT | CUTANEOUS | 2 refills | Status: AC

## 2024-11-13 NOTE — Progress Notes (Signed)
    Subjective   Natalie Dickerson is a 63 y.o. female who presents for the following: Follow up of Urticaria. Patient is established patient   Today patient reports: Continued itching on her back and posterior neck. Patient has used OTC Benadryl  cream w/o relief.   Review of Systems:    No other skin or systemic complaints except as noted in HPI or Assessment and Plan.  The following portions of the chart were reviewed this encounter and updated as appropriate: medications, allergies, medical history  Relevant Medical History:  Personal history of non melanoma skin cancer - see medical history for full details   Objective  (SKPE) Well appearing patient in no apparent distress; mood and affect are within normal limits. Examination was performed of the: Waist Up Skin Exam: scalp, head, eyes, ears, nose, lips, neck, chest, axillae, upper extremities, abdomen, back, hands, fingers, fingernails   Examination notable for: erythematous papules on trunk, occipital scalp  Examination limited by: Clothing and Patient deferred removal       Assessment & Plan  (SKAP)   Papular urticaria vs eczema  Chronic and persistent condition with duration or expected duration over one year. Condition is symptomatic and bothersome to patient. Patient is flaring and not currently at treatment goal.  - Explained the process of cholinergic urticaria that can be associated with overheating, overstimulation - Continue antihistamine   - Advised can use up to 4x the recommended dose of antihistamines  - pt has one kidney w/ normal kidney function - recommend discussing with kidney doctor if antihistamines need any dose adjustment  - Stressed the importance of taking these pills even when not itching.  - Encouraged avoidance of exacerbating factors (e.g. Heat) - Can consider Allergy referral, omalizumab, dupixent in the future  - Consider rhapsido (remibrutinib)  start triamcinolone ointment 0.1% twice daily to  affected areas of skin - Start clobetasol ointment 0.05% twice daily to affected skin as needed and Clobetasol 0.05% solution on the scalp apply twice daily as needed - Start clobetasol solution 0.05% twice daily to affected skin - scalp  - Discussed side effect of super potent topical steroids including atrophy, dyspigmentation, striae, telangectasia, folliculitis, loss of skin pigment, hair growth, tachyphylaxis, risk of systemic absorption with missuse.     Level of service outlined above   Patient instructions (SKPI)   Procedures, orders, diagnosis for this visit:    There are no diagnoses linked to this encounter.  Return to clinic: Return 4-6 weeks, for Urticaria .  I, Emerick Ege, CMA am acting as scribe for Lauraine JAYSON Kanaris, MD.   Documentation: I have reviewed the above documentation for accuracy and completeness, and I agree with the above.  Lauraine JAYSON Kanaris, MD

## 2024-11-13 NOTE — Patient Instructions (Addendum)
 Start Triamcinolone 0.1% ointment twice daily as needed for itching Start Clobetasol 0.05% ointment twice daily as needed for itching Start Clobetasol 0.05% Solution twice daily as needed for itching in the scalp   Due to recent changes in healthcare laws, you may see results of your pathology and/or laboratory studies on MyChart before the doctors have had a chance to review them. We understand that in some cases there may be results that are confusing or concerning to you. Please understand that not all results are received at the same time and often the doctors may need to interpret multiple results in order to provide you with the best plan of care or course of treatment. Therefore, we ask that you please give us  2 business days to thoroughly review all your results before contacting the office for clarification. Should we see a critical lab result, you will be contacted sooner.   If You Need Anything After Your Visit  If you have any questions or concerns for your doctor, please call our main line at [phone] and press option 4 to reach your doctor's medical assistant. If no one answers, please leave a voicemail as directed and we will return your call as soon as possible. Messages left after 4 pm will be answered the following business day.   You may also send us  a message via MyChart. We typically respond to MyChart messages within 1-2 business days.  For prescription refills, please ask your pharmacy to contact our office. Our fax number is [phone].  If you have an urgent issue when the clinic is closed that cannot wait until the next business day, you can page your doctor at the number below.    Please note that while we do our best to be available for urgent issues outside of office hours, we are not available 24/7.   If you have an urgent issue and are unable to reach us , you may choose to seek medical care at your doctor's office, retail clinic, urgent care center, or  emergency room.  If you have a medical emergency, please immediately call 911 or go to the emergency department.  Pager Numbers  - Dr. Hester: [phone]  - Dr. Jackquline: [phone]  - Dr. Claudene: [phone]   - Dr. Raymund: [phone]  In the event of inclement weather, please call our main line at [phone] for an update on the status of any delays or closures.  Dermatology Medication Tips: Please keep the boxes that topical medications come in in order to help keep track of the instructions about where and how to use these. Pharmacies typically print the medication instructions only on the boxes and not directly on the medication tubes.   If your medication is too expensive, please contact our office at [phone] option 4 or send us  a message through MyChart.   We are unable to tell what your co-pay for medications will be in advance as this is different depending on your insurance coverage. However, we may be able to find a substitute medication at lower cost or fill out paperwork to get insurance to cover a needed medication.   If a prior authorization is required to get your medication covered by your insurance company, please allow us  1-2 business days to complete this process.  Drug prices often vary depending on where the prescription is filled and some pharmacies may offer cheaper prices.  The website www.goodrx.com contains coupons for medications through different pharmacies. The prices here do not account for what the  cost may be with help from insurance (it may be cheaper with your insurance), but the website can give you the price if you did not use any insurance.  - You can print the associated coupon and take it with your prescription to the pharmacy.  - You may also stop by our office during regular business hours and pick up a GoodRx coupon card.  - If you need your prescription sent electronically to a different pharmacy, notify our office through [facility] or by phone at [phone] option 4.     Si Usted Necesita Algo Despus de Su Visita  Tambin puede enviarnos un mensaje a travs de Clinical Cytogeneticist. Por lo general respondemos a los mensajes de MyChart en el transcurso de 1 a 2 das hbiles.  Para renovar recetas, por favor pida a su farmacia que se ponga en contacto con nuestra oficina. Randi lakes de fax es [place] [phone].  Si tiene un asunto urgente cuando la clnica est cerrada y que no puede esperar hasta el siguiente da hbil, puede llamar/localizar a su doctor(a) al nmero que aparece a continuacin.   Por favor, tenga en cuenta que aunque hacemos todo lo posible para estar disponibles para asuntos urgentes fuera del horario de [place], no estamos disponibles las 24 horas del da, los 7 [address]  [address] de la [place].   Si tiene un problema urgente y no puede comunicarse con nosotros, puede optar por buscar atencin mdica  en el consultorio de su doctor(a), en una clnica privada, en un centro de atencin urgente o en una sala de emergencias.  Si tiene engineer, drilling, por favor llame inmediatamente al 911 o vaya a la sala de emergencias.  Nmeros de bper  - Dr. Hester: [phone]  - Dra. Jackquline: [phone]  - Dr. Claudene: [phone]  - Dra. Kitts: [phone]  En caso de inclemencias del [place], por favor llame a nuestra lnea principal al [phone] para una actualizacin sobre el estado de cualquier retraso o cierre.  Consejos para la medicacin en dermatologa: Por favor, guarde las cajas en las que vienen los medicamentos de uso tpico para ayudarle a seguir las instrucciones sobre dnde y cmo usarlos. Las farmacias generalmente imprimen las instrucciones del medicamento slo en las cajas y no directamente en los tubos del [place].   Si su medicamento es muy caro, por favor, pngase en contacto con landry rieger llamando al [phone] y presione la opcin 4 o envenos un mensaje a travs de  Clinical Cytogeneticist.   No podemos decirle cul ser su copago por los medicamentos por adelantado ya que esto es diferente dependiendo de la cobertura de su seguro. Sin embargo, es posible que podamos encontrar un medicamento sustituto a audiological scientist un formulario para que el seguro cubra el medicamento que se considera necesario.   Si se requiere una autorizacin previa para que su compaa de seguros cubra su medicamento, por favor permtanos de 1 a 2 das hbiles para completar este proceso.  Los precios de los medicamentos varan con frecuencia dependiendo del environmental consultant de dnde se surte la receta y alguna farmacias pueden ofrecer precios ms baratos.  El sitio web www.goodrx.com tiene cupones para medicamentos de health and safety inspector. Los precios aqu no tienen en cuenta lo que podra costar con la ayuda del seguro (puede ser ms barato con su seguro), pero el sitio web puede darle el precio si no utiliz tourist information centre manager.  - Puede imprimir el cupn correspondiente y llevarlo con su receta a la farmacia.  -  Tambin puede pasar por nuestra oficina durante el horario de atencin regular y education officer, museum una tarjeta de cupones de GoodRx.  - Si necesita que su receta se enve electrnicamente a una farmacia diferente, informe a nuestra oficina a travs de MyChart de [place] o por telfono llamando al [phone] y presione la opcin 4.

## 2024-11-14 ENCOUNTER — Telehealth (INDEPENDENT_AMBULATORY_CARE_PROVIDER_SITE_OTHER): Payer: Self-pay

## 2024-11-14 NOTE — Telephone Encounter (Signed)
 Spoke with patient at this time and informed her of provider feedback to follow up with PCP for kidney function based on concerns of medication regimen suggested by dermatologist. Patient understood and stated she will reach out to her PCP at this time.

## 2024-11-14 NOTE — Telephone Encounter (Signed)
 As far as blood flow to her kidneys (which is what is controlled by the renal arteries), that will not be affected by the medication regimen.  As far as the kidney function, she would have discuss that with her PCP as we don't follow her kidney function just how the blood is flowing to it

## 2024-11-14 NOTE — Telephone Encounter (Signed)
 Patient called nurse line and was concerned about a medication regimen her dermatologist placed her on, she will be taking allegra 1 pill a week for week one, two pills a week for week two, three pills a week for week three, and then she will progress to 1 pill a day for week one, two pills a week for week two up to 4 pills a day on week four. Patient was concerned with the pills being 180mg  if this would be safe for her kidneys after her previous renal artery issues? Please advise.

## 2024-11-20 ENCOUNTER — Telehealth: Payer: Self-pay

## 2024-11-20 NOTE — Telephone Encounter (Signed)
 Patient called and left nurse voicemail that she is having no improvement with her hives and rash. She has been using the cream and shampoo. She is not scheduled for a follow up until 12/11/24. aw

## 2024-11-21 ENCOUNTER — Ambulatory Visit: Payer: Self-pay

## 2024-11-21 MED ORDER — CLOBETASOL PROPIONATE 0.05 % EX SHAM: MEDICATED_SHAMPOO | CUTANEOUS | 5 refills | Status: DC

## 2024-11-21 MED ORDER — FLUOCINOLONE ACETONIDE BODY 0.01 % EX OIL: TOPICAL_OIL | CUTANEOUS | 5 refills | Status: AC

## 2024-11-21 NOTE — Telephone Encounter (Signed)
 Patient called and advised of information per Dr. Raymund.  She is still waiting to contact her kidney specialist due to only have one kidney and uping her antihistamine dose.   She is asked for alternative for her scalp. She said can she use anything she already as at home or can you send in a cream or ointment?

## 2024-11-21 NOTE — Telephone Encounter (Signed)
 Yes, please let patient know that is is safe to increase her Allegra dose per her dermatologist's recommendations.

## 2024-11-21 NOTE — Telephone Encounter (Signed)
 FYI Only or Action Required?: Action required by provider: clinical question for provider, update on patient condition, and patient is needing PCP okay to increase her Allegra dose per dermatology recommendations.  Patient was last seen in primary care on 06/06/2024 by Myrla Jon HERO, MD.  Called Nurse Triage reporting Medication Problem and Rash.  Symptoms began a couple of months ago.  Interventions attempted: Prescription medications: SEE [facility] and Rest, hydration, or home remedies.  Symptoms are: unchanged.  Triage Disposition: See Physician Within 24 Hours, Call PCP Now  Patient/caregiver understands and will follow disposition?: No, wishes to speak with PCP  Copied from CRM #[id number]. Topic: Clinical - Medication Question >> Nov 21, 2024 12:18 PM Myrick T wrote: Reason for CRM: patient called stated she was advised by another provider to take up to 4 Allegra 180mg  per tablet. She was advised to take them as she has a rash on her scalp and back. Patient also said she is only taking 2 per day because she only has 1 kidney and is afraid to take more unless her PCP says it's ok. Please f/u with patient Reason for Disposition  [1] Caller has URGENT medicine question about med that primary care doctor (or NP/PA) or specialist prescribed AND [2] triager unable to answer question  SEVERE itching (i.e., interferes with sleep, normal activities or school)  Answer Assessment - Initial Assessment Questions 1. APPEARANCE of RASH: What does the rash look like? (e.g., blisters, dry flaky skin, red spots, redness, sores)     Red spots  2. SIZE: How big are the spots? (e.g., tip of pen, eraser, coin; inches, centimeters)     Different sizes 3. LOCATION: Where is the rash located?     Scalp and back 4. COLOR: What color is the rash? (Note: It is difficult to assess rash color in people with darker-colored skin. When this situation occurs, simply ask the caller to describe what they  see.)     red 5. ONSET: When did the rash begin?     Started two months ago 6. FEVER: Do you have a fever? If Yes, ask: What is your temperature, how was it measured, and when did it start?     no 7. ITCHING: Does the rash itch? If Yes, ask: How bad is the itch? (Scale 1-10; or mild, moderate, severe)     Yes-moderate to severe depending on the time of day 8. CAUSE: What do you think is causing the rash?     Unsure-being treated by derm 9. MEDICINE FACTORS: Have you started any new medicines within the last 2 weeks? (e.g., antibiotics)      no 10. OTHER SYMPTOMS: Do you have any other symptoms? (e.g., dizziness, headache, sore throat, joint pain)       no  Answer Assessment - Initial Assessment Questions 1. NAME of MEDICINE: What medicine(s) are you calling about?     Allegra 180 mg 2. QUESTION: What is your question? (e.g., double dose of medicine, side effect)     Patient was instructed by dermatology to increase Allegra 180 mg to 4 pills a day over the span of 4 weeks. Patient was told to check with PCP prior to doing this. Patient is currently on 2 180 mg Allegra a day.  3. PRESCRIBER: Who prescribed the medicine? Reason: if prescribed by specialist, call should be referred to that group.     Instructed by dermatology 4. SYMPTOMS: Do you have any symptoms? If Yes, ask: What symptoms are you  having?  How bad are the symptoms (e.g., mild, moderate, severe)     no  Protocols used: Rash or Redness - Widespread-A-AH, Medication Question Call-A-AH

## 2024-11-21 NOTE — Telephone Encounter (Signed)
 Fluocinolone  oil sent to pharmacy; patient has already tried Clobetasol  shampoo w/o good results. Is there anything else you would like me to send or just add the oil for now and see how that works?

## 2024-11-22 NOTE — Telephone Encounter (Signed)
 Pt advised. Verbalized understanding.

## 2024-11-28 ENCOUNTER — Ambulatory Visit (INDEPENDENT_AMBULATORY_CARE_PROVIDER_SITE_OTHER): Admitting: Family Medicine

## 2024-11-28 ENCOUNTER — Encounter: Payer: Self-pay | Admitting: Family Medicine

## 2024-11-28 VITALS — BP 138/84 | HR 71 | Ht 63.0 in | Wt 139.1 lb

## 2024-11-28 DIAGNOSIS — R7303 Prediabetes: Secondary | ICD-10-CM | POA: Diagnosis not present

## 2024-11-28 DIAGNOSIS — R21 Rash and other nonspecific skin eruption: Secondary | ICD-10-CM

## 2024-11-28 DIAGNOSIS — I150 Renovascular hypertension: Secondary | ICD-10-CM | POA: Diagnosis not present

## 2024-11-28 MED ORDER — LOSARTAN POTASSIUM 25 MG PO TABS: 25.0000 mg | ORAL_TABLET | Freq: Every day | ORAL | 1 refills | Status: AC

## 2024-11-28 NOTE — Progress Notes (Signed)
 Acute visit   [name]   DOB: May 12, 1961   63 y.o. Female  MRN: [id number] PCP: Myrla Jon HERO, MD   Chief Complaint  Patient presents with   Acute Visit    rash on the back of head 7-8 bumps and on back in different spots, headache   Rash    Present X 1 month. Reports rash on back cleared starting Thursday after stopping her allegra that she was advised to take by dermatology but she does still have itching. No changes with the bumps on her head   Subjective    Discussed the use of AI scribe software for clinical note transcription with the patient, who gave verbal consent to proceed.  History of Present Illness   Natalie Dickerson is a 64 year old female who presents with a persistent rash and itching.  The rash began before Thanksgiving with itching and redness on her back and scalp, described as intensely pruritic. After she stopped Allegra, which she had been taking up to twice daily per dermatology, the rash on her back improved but scalp symptoms persisted. She declined increasing Allegra to four times daily.  The back rash has largely resolved, with only a few remaining bumps. Prior involvement of the neck and shoulder blade has resolved. The scalp rash remains with very itchy bumps.  She is using triamcinolone  and clobetasol  ointments and a clobetasol  shampoo as prescribed by dermatology. She has difficulty applying ointment to the scalp because of her hair and has not had benefit from the shampoo.  She takes losartan  25 mg daily for blood pressure. She denies fever or other systemic symptoms related to the rash.        Review of Systems  Objective    BP 138/84 (BP Location: Left Arm, Patient Position: Sitting, Cuff Size: Normal)   Pulse 71   Ht 5' 3 (1.6 m)   Wt 139 lb 1.6 oz (63.1 kg)   SpO2 99%   BMI 24.64 kg/m  Physical Exam Vitals reviewed.  Constitutional:      General: She is not in acute distress.    Appearance: Normal appearance.  She is well-developed. She is not diaphoretic.  HENT:     Head: Normocephalic and atraumatic.  Eyes:     General: No scleral icterus.    Conjunctiva/sclera: Conjunctivae normal.  Neck:     Thyroid: No thyromegaly.  Cardiovascular:     Rate and Rhythm: Normal rate and regular rhythm.     Heart sounds: Normal heart sounds. No murmur heard. Pulmonary:     Effort: Pulmonary effort is normal. No respiratory distress.     Breath sounds: Normal breath sounds. No wheezing, rhonchi or rales.  Musculoskeletal:     Cervical back: Neck supple.     Right lower leg: No edema.     Left lower leg: No edema.  Lymphadenopathy:     Cervical: No cervical adenopathy.  Skin:    General: Skin is warm and dry.     Findings: Rash present.  Neurological:     Mental Status: She is alert and oriented to person, place, and time. Mental status is at baseline.  Psychiatric:        Mood and Affect: Mood normal.        Behavior: Behavior normal.       No results found for any visits on 11/28/24.  Assessment & Plan     Problem List Items Addressed This Visit  Cardiovascular and Mediastinum   Renovascular hypertension   Relevant Medications   losartan  (COZAAR ) 25 MG tablet   Other Relevant Orders   Comprehensive metabolic panel with GFR     Other   Prediabetes   Relevant Orders   Hemoglobin A1c   Other Visit Diagnoses       Rash    -  Primary           Atopic dermatitis Chronic rash primarily on the back and scalp, with itching and redness. Rash improved after discontinuing Allegra, suggesting it is not hives. Differential diagnosis includes eczema and papular urticaria. Current treatment with triamcinolone  and clobetasol  ointments is not fully effective, especially on the scalp due to hair interference. Scalp treatment is challenging due to hair, but clobetasol  shampoo is recommended for application directly to the scalp. Moisturizing and avoiding hot showers are advised to manage  symptoms. Eczema can be chronic with intermittent flare-ups. - Continue triamcinolone  and clobetasol  ointments for affected areas. - Use clobetasol  shampoo on the scalp, ensuring direct application with the nozzle and leaving it on for 15 minutes before rinsing. - Moisturize skin within 5 minutes after showering with a scent-free moisturizer like Eucerin. - Avoid hot showers to prevent exacerbation of itching. - Consider using an air purifier in the bedroom to reduce dust exposure. - Follow up with dermatology as scheduled.  Renovascular hypertension Blood pressure is well-controlled on current medication regimen. - Continue losartan  25 mg daily. - Sent prescription for losartan  to mail order pharmacy for a 77-month supply.  General health maintenance Routine health maintenance discussed, including blood work and flu vaccination. - Ordered blood work including A1c, kidney function, liver function, and electrolytes. - Discussed flu vaccination annually.       Meds ordered this encounter  Medications   losartan  (COZAAR ) 25 MG tablet    Sig: Take 1 tablet (25 mg total) by mouth daily.    Dispense:  90 tablet    Refill:  1    Please send a replace/new response with 90-Day Supply if appropriate to maximize member benefit. Requesting 1 year supply.     Return in about 6 months (around 05/29/2025) for as scheduled.      Jon Eva, MD  [facility] [phone] (phone) [phone] (fax)  [facility]

## 2024-11-29 ENCOUNTER — Ambulatory Visit: Payer: Self-pay | Admitting: Family Medicine

## 2024-11-29 ENCOUNTER — Ambulatory Visit

## 2024-11-29 LAB — COMPREHENSIVE METABOLIC PANEL WITH GFR
ALT: 14 IU/L (ref 0–32)
AST: 19 IU/L (ref 0–40)
Albumin: 4.4 g/dL (ref 3.9–4.9)
Alkaline Phosphatase: 88 IU/L (ref 49–135)
BUN/Creatinine Ratio: 22 (ref 12–28)
BUN: 22 mg/dL (ref 8–27)
Bilirubin Total: 0.3 mg/dL (ref 0.0–1.2)
CO2: 22 mmol/L (ref 20–29)
Calcium: 9.8 mg/dL (ref 8.7–10.3)
Chloride: 102 mmol/L (ref 96–106)
Creatinine, Ser: 0.98 mg/dL (ref 0.57–1.00)
Globulin, Total: 2.5 g/dL (ref 1.5–4.5)
Glucose: 81 mg/dL (ref 70–99)
Potassium: 4.5 mmol/L (ref 3.5–5.2)
Sodium: 138 mmol/L (ref 134–144)
Total Protein: 6.9 g/dL (ref 6.0–8.5)
eGFR: 66 mL/min/1.73 (ref >59)

## 2024-11-29 LAB — HEMOGLOBIN A1C
Est. average glucose Bld gHb Est-mCnc: 111 mg/dL
Hgb A1c MFr Bld: 5.5 % (ref 4.8–5.6)

## 2024-12-11 ENCOUNTER — Ambulatory Visit

## 2024-12-11 DIAGNOSIS — L281 Prurigo nodularis: Secondary | ICD-10-CM

## 2024-12-11 DIAGNOSIS — L209 Atopic dermatitis, unspecified: Secondary | ICD-10-CM | POA: Diagnosis not present

## 2024-12-11 DIAGNOSIS — L2084 Intrinsic (allergic) eczema: Secondary | ICD-10-CM

## 2024-12-11 MED ORDER — CLOBETASOL PROPIONATE 0.05 % EX OINT: TOPICAL_OINTMENT | CUTANEOUS | 5 refills | Status: DC

## 2024-12-11 MED ORDER — DUPIXENT 300 MG/2ML ~~LOC~~ SOAJ: 600.0000 mg | Freq: Once | SUBCUTANEOUS | 0 refills | Status: AC

## 2024-12-11 MED ORDER — TACROLIMUS 0.1 % EX OINT: TOPICAL_OINTMENT | CUTANEOUS | 5 refills | Status: AC

## 2024-12-11 MED ORDER — CLOBETASOL PROPIONATE 0.05 % EX SHAM: MEDICATED_SHAMPOO | CUTANEOUS | 5 refills | Status: AC

## 2024-12-11 MED ORDER — FLUOCINOLONE ACETONIDE BODY 0.01 % EX OIL: TOPICAL_OIL | CUTANEOUS | 5 refills | Status: AC

## 2024-12-11 MED ORDER — DUPIXENT 300 MG/2ML ~~LOC~~ SOAJ: 300.0000 mg | SUBCUTANEOUS | 10 refills | Status: DC

## 2024-12-11 NOTE — Progress Notes (Signed)
 Subjective   Natalie Dickerson is a 63 y.o. female who presents for the following: Follow up of urticaria. Patient is established patient   Today patient reports: Itching has improved but not fully resolved.   Review of Systems:    No other skin or systemic complaints except as noted in HPI or Assessment and Plan.  The following portions of the chart were reviewed this encounter and updated as appropriate: medications, allergies, medical history  Relevant Medical History:  n/a   Objective  (SKPE) Well appearing patient in no apparent distress; mood and affect are within normal limits. Examination was performed of the: Full Skin Examination: scalp, head, eyes, ears, nose, lips, neck, chest, axillae, abdomen, back, buttocks, bilateral upper extremities, bilateral lower extremities, hands, feet, fingers, toes, fingernails, and toenails.   Examination notable for: erythematous scaly papules and plaques on trunk, extremities  Examination limited by: Undergarments and Patient deferred removal       Assessment & Plan  (SKAP)   Atopic dermatitis with prurigo nodularis - moderate - with itch impacting QOL IGA-PN Stage/Activity 3, IGA Eczema 3  Chronic and persistent condition with duration or expected duration over one year. Condition is symptomatic and bothersome to patient. Patient is flaring and not currently at treatment goal.  - Discussed diagnosis, typical course, and treatment options for this condition - Discussed itch scratch cycle and risk of kobenerization - Advised against excoriation (discussed itch-scratch cycle), keep nails short - Mosturization with Eucerin or Vaseline (purchase Cerave if desired), lukewarm showers, Dove sensitive skin soap to axilla/groin - CMP from 11/28/24 reviewed WNL - Given the moderate-severe atopic dermatitis with prurigo nodularis  and unresponsive to past treatment with topical treatment with clobetasol  and triamcinolone  will pursue dupilumab . Atopic  dermatitis is greatly impacting quality of life with intense pruritus, sleep disruption  - Start dupilumab  600mg  subcutaneously on day 0, then 300mg  every other week starting day 14 - Side effects reviewed at length: injection site reactions, nasopharyngitis/conjunctivitis/URI, headache, exacerbation of atopic dermatitis, parasitic infection (be careful when traveling or camping) - Discussed needs to have container for sharps and proper disposal - Continue clobetasol  ointment 0.05% twice daily to affected skin Discussed side effect of super potent topical steroids including atrophy, dyspigmentation, striae, telangectasia, folliculitis, loss of skin pigment, hair growth, tachyphylaxis, risk of systemic absorption with missuse. - Continue triamcinolone  ointment 0.1% twice daily to affected areas of skin Discussed side effect of potent topical steroids including atrophy, dyspigmentation, striae, telangectasia, folliculitis, loss of skin pigment, hair growth, tachyphylaxis, risk of systemic absorption with missuse. Continue Clobetasol  Shampoo 0.05% Apply to affected areas of scalp. Let sit for 15 minutes. Then add water, lather, and rinse out  Continue  fluocinolone  oil 0.01% nightly, and then wearing a shower cap to bed to let oil sit on scalp. Educated on risks of high potency topical steroids Start tacrolimus  ointment 0.1% twice daily on affected areas  Educated about black box warning (and also that recent studies show no increased malignancy risk) and common adverse effects such as burning with application (advised to cool in fridge and only apply to bone-dry skin). Given location of eruption, unsafe to use daily topical CS to area. Patient requires topical calcineurin inhibitor given high risk of dyspigmentation and atrophy from topical CS use in sensitive area.         Was sun protection counseling provided?: Yes   Level of service outlined above   Patient instructions (SKPI)    Procedures, orders, diagnosis for  this visit:    There are no diagnoses linked to this encounter.  Return to clinic: Return 6-8 weeks, for Atopic Derm.  I, Emerick Ege, CMA am acting as scribe for Lauraine JAYSON Kanaris, MD.   Documentation: I have reviewed the above documentation for accuracy and completeness, and I agree with the above.  Lauraine JAYSON Kanaris, MD

## 2024-12-11 NOTE — Patient Instructions (Addendum)
 Natalie Dickerson Your prescription has been sent to [facility].  [facility] will call you to obtain additional  information needed to fill your prescription such as:  Additional insurance information Payment for any out-of-pocket expense (may require credit card) Address for drug delivery shipment  A timely response to the pharmacy may help you obtain your medication more quickly. Please call Natalie Dickerson at [phone].   Due to recent changes in healthcare laws, you may see results of your pathology and/or laboratory studies on MyChart before the doctors have had a chance to review them. We understand that in some cases there may be results that are confusing or concerning to you. Please understand that not all results are received at the same time and often the doctors may need to interpret multiple results in order to provide you with the best plan of care or course of treatment. Therefore, we ask that you please give us  2 business days to thoroughly review all your results before contacting the office for clarification. Should we see a critical lab result, you will be contacted sooner.   If You Need Anything After Your Visit  If you have any questions or concerns for your doctor, please call our main line at [phone] and press option 4 to reach your doctor's medical assistant. If no one answers, please leave a voicemail as directed and we will return your call as soon as possible. Messages left after 4 pm will be answered the following business day.   You may also send us  a message via MyChart. We typically respond to MyChart messages within 1-2 business days.  For prescription refills, please ask your pharmacy to contact our office. Our fax number is [phone].  If you have an urgent issue when the clinic is closed that cannot wait until the next business day, you can page your doctor at the number below.    Please note that while we do our  best to be available for urgent issues outside of office hours, we are not available 24/7.   If you have an urgent issue and are unable to reach us , you may choose to seek medical care at your doctor's office, retail clinic, urgent care center, or emergency room.  If you have a medical emergency, please immediately call 911 or go to the emergency department.  Pager Numbers  - Dr. Hester: [phone]  - Dr. Jackquline: [phone]  - Dr. Claudene: [phone]   - Dr. Raymund: [phone]  In the event of inclement weather, please call our main line at [phone] for an update on the status of any delays or closures.  Dermatology Medication Tips: Please keep the boxes that topical medications come in in order to help keep track of the instructions about where and how to use these. Pharmacies typically print the medication instructions only on the boxes and not directly on the medication tubes.   If your medication is too expensive, please contact our office at [phone] option 4 or send us  a message through MyChart.   We are unable to tell what your co-pay for medications will be in advance as this is different depending on your insurance coverage. However, we may be able to find a substitute medication at lower cost or fill out paperwork to get insurance to cover a needed medication.   If a prior authorization is required to get your medication covered by your insurance company, please allow us  1-2 business days to complete this process.  Drug prices often vary depending on where the prescription is filled and some pharmacies may offer cheaper prices.  The website www.goodrx.com contains coupons for medications through different pharmacies. The prices here do not account for what the cost may be with help from insurance (it may be cheaper with your insurance), but the website can give you the price if you did not use any insurance.  - You can print the associated coupon and take it  with your prescription to the pharmacy.  - You may also stop by our office during regular business hours and pick up a GoodRx coupon card.  - If you need your prescription sent electronically to a different pharmacy, notify our office through [facility] or by phone at [phone] option 4.     Si Usted Necesita Algo Despus de Su Visita  Tambin puede enviarnos un mensaje a travs de Clinical cytogeneticist. Por lo general respondemos a los mensajes de MyChart en el transcurso de 1 a 2 das hbiles.  Para renovar recetas, por favor pida a su farmacia que se ponga en contacto con nuestra oficina. Randi lakes de fax es [place] [phone].  Si tiene un asunto urgente cuando la clnica est cerrada y que no puede esperar hasta el siguiente da hbil, puede llamar/localizar a su doctor(a) al nmero que aparece a continuacin.   Por favor, tenga en cuenta que aunque hacemos todo lo posible para estar disponibles para asuntos urgentes fuera del horario de [place], no estamos disponibles las 24 horas del da, los 7 [address]  [address] de la [place].   Si tiene un problema urgente y no puede comunicarse con nosotros, puede optar por buscar atencin mdica  en el consultorio de su doctor(a), en una clnica privada, en un centro de atencin urgente o en una sala de emergencias.  Si tiene Engineer, drilling, por favor llame inmediatamente al 911 o vaya a la sala de emergencias.  Nmeros de bper  - Dr. Hester: [phone]  - Dra. Jackquline: [phone]  - Dr. Claudene: [phone]  - Dra. Kitts: [phone]  En caso de inclemencias del [place], por favor llame a nuestra lnea principal al [phone] para una actualizacin sobre el estado de cualquier retraso o cierre.  Consejos para la medicacin en dermatologa: Por favor, guarde las cajas en las que vienen los medicamentos de uso tpico para ayudarle a seguir las instrucciones sobre dnde y cmo usarlos. Las farmacias generalmente imprimen las instrucciones  del medicamento slo en las cajas y no directamente en los tubos del [place].   Si su medicamento es muy caro, por favor, pngase en contacto con landry rieger llamando al [phone] y presione la opcin 4 o envenos un mensaje a travs de Clinical cytogeneticist.   No podemos decirle cul ser su copago por los medicamentos por adelantado ya que esto es diferente dependiendo de la cobertura de su seguro. Sin embargo, es posible que podamos encontrar un medicamento sustituto a Audiological scientist un formulario para que el seguro cubra el medicamento que se considera necesario.   Si se requiere una autorizacin previa para que su compaa de seguros malta su medicamento, por favor permtanos de 1 a 2 das hbiles para completar este proceso.  Los precios de los medicamentos varan con frecuencia dependiendo del Environmental consultant de dnde se surte la receta y alguna farmacias pueden ofrecer precios ms baratos.  El sitio web www.goodrx.com tiene cupones para medicamentos de Health and safety inspector. Los precios aqu no tienen en cuenta lo que podra costar con la [employer]  del seguro (puede ser ms barato con su seguro), pero el sitio web puede darle el precio si no Visual merchandiser.  - Puede imprimir el cupn correspondiente y llevarlo con su receta a la farmacia.  - Tambin puede pasar por nuestra oficina durante el horario de atencin regular y Education officer, museum una tarjeta de cupones de GoodRx.  - Si necesita que su receta se enve electrnicamente a una farmacia diferente, informe a nuestra oficina a travs de MyChart de [place] o por telfono llamando al [phone] y presione la opcin 4.

## 2024-12-12 ENCOUNTER — Other Ambulatory Visit: Payer: Self-pay

## 2024-12-12 ENCOUNTER — Ambulatory Visit: Admitting: Family Medicine

## 2024-12-12 MED ORDER — DUPIXENT 300 MG/2ML ~~LOC~~ SOAJ: 600.0000 mg | Freq: Once | SUBCUTANEOUS | 0 refills | Status: AC

## 2024-12-18 ENCOUNTER — Telehealth: Payer: Self-pay

## 2024-12-18 NOTE — Telephone Encounter (Signed)
 Per Bed bath & beyond requesting BSA of at least 10% or SCORAD index value of at least 25. Please advise.

## 2024-12-19 ENCOUNTER — Ambulatory Visit: Admitting: Dermatology

## 2024-12-19 DIAGNOSIS — R21 Rash and other nonspecific skin eruption: Secondary | ICD-10-CM | POA: Diagnosis not present

## 2024-12-19 DIAGNOSIS — D308 Benign neoplasm of other specified urinary organs: Secondary | ICD-10-CM

## 2024-12-19 DIAGNOSIS — L309 Dermatitis, unspecified: Secondary | ICD-10-CM | POA: Diagnosis not present

## 2024-12-19 MED ORDER — IVERMECTIN 3 MG PO TABS: ORAL_TABLET | ORAL | 0 refills | Status: AC

## 2024-12-19 NOTE — Patient Instructions (Signed)

## 2024-12-19 NOTE — Progress Notes (Signed)
" ° °  Follow Up Visit   Subjective  Natalie Dickerson is a 63 y.o. female who presents for the following: Rash  Rash all over itchy not painful; patient has been seen three times prior to today's appointment. Patient has tried Triamcinolone , Clobetasol , Tacrolimus , and Fluocinolone  oil. She was prescribed Dupixent  but has not started this yet. Patient reports that before the rash initially appeared she had started taking OTC N[employer] Promise hair skin and nail vitamins and [employer] brand krill oil; once the rash appeared she discontinued both of these.   The following portions of the chart were reviewed this encounter and updated as appropriate: medications, allergies, medical history  Review of Systems:  No other skin or systemic complaints except as noted in HPI or Assessment and Plan.  Objective  Well appearing patient in no apparent distress; mood and affect are within normal limits.  A focused examination was performed of the following areas: Abdomen, back, upper and lower extremities, groin, face  Relevant exam findings are noted in the Assessment and Plan.  Right Abdomen (side) - Lower Inflamed pink papule    Assessment & Plan   Rash, resistant to tacrolimus  triamcinolone  clobetasol  fluocinolone  Exam: Numerous inflamed pink papules scattered on trunk, extremities, scalp. many with excoriation  Differential diagnosis:  Eczema vs Urticaria vs Grovers vs Scabies  Treatment Plan: - discussed possibility of occult scabies infection. May explain resistance to topical steroids. Discussed ivermectin  SE including but not limited to headache GI upset edema tachycardia allergy - Biopsy obtained - Start Ivermectin  3mg  take 4 tablets by mouth once and then a second time in 14 days   RASH AND OTHER NONSPECIFIC SKIN ERUPTION Right Abdomen (side) - Lower - Skin / nail biopsy - Right Abdomen (side) - Lower Type of biopsy: tangential   Informed consent: discussed and consent obtained   Timeout:  patient name, date of birth, surgical site, and procedure verified   Procedure prep:  Patient was prepped and draped in usual sterile fashion Prep type:  Isopropyl alcohol Anesthesia: the lesion was anesthetized in a standard fashion   Anesthetic:  1% lidocaine  w/ epinephrine  1-100,000 buffered w/ 8.4% NaHCO3 Instrument used: DermaBlade   Hemostasis achieved with: pressure and aluminum chloride   Outcome: patient tolerated procedure well   Post-procedure details: sterile dressing applied and wound care instructions given   Dressing type: bandage and petrolatum    Specimen 1 - Surgical pathology Differential Diagnosis: Eczema vs Urticaria vs Grover's  vs Scabies   Check Margins: No This Visit - ivermectin  (STROMECTOL ) 3 MG TABS tablet - Take 4 tablets once then a second time in 14 days     Return for Appointment as scheduled.  I, Emerick Ege, CMA am acting as scribe for Boneta Sharps, MD.   Documentation: I have reviewed the above documentation for accuracy and completeness, and I agree with the above.  Boneta Sharps, MD  "

## 2024-12-22 LAB — SURGICAL PATHOLOGY

## 2024-12-25 ENCOUNTER — Encounter: Payer: Self-pay | Admitting: Dermatology

## 2024-12-25 ENCOUNTER — Other Ambulatory Visit: Payer: Self-pay | Admitting: Dermatology

## 2024-12-25 ENCOUNTER — Telehealth: Payer: Self-pay

## 2024-12-25 MED ORDER — DOXYCYCLINE HYCLATE 100 MG PO TABS: 100.0000 mg | ORAL_TABLET | Freq: Two times a day (BID) | ORAL | 0 refills | Status: AC

## 2024-12-25 NOTE — Telephone Encounter (Signed)
 Atopic dermatitis with prurigo nodularis - moderate - with itch impacting QOL IGA-PN Stage/Activity 3, IGA Eczema 3  SCORAD 40  Chronic and persistent condition with duration or expected duration over one year. Condition is symptomatic and bothersome to patient. Patient is flaring and not currently at treatment goal.  - Discussed diagnosis, typical course, and treatment options for this condition - Discussed itch scratch cycle and risk of kobenerization - Advised against excoriation (discussed itch-scratch cycle), keep nails short - Mosturization with Eucerin or Vaseline (purchase Cerave if desired), lukewarm showers, Dove sensitive skin soap to axilla/groin - CMP from 11/28/24 reviewed WNL - Given the moderate-severe atopic dermatitis with prurigo nodularis  and unresponsive to past treatment with topical treatment with clobetasol  and triamcinolone  will pursue dupilumab . Atopic dermatitis is greatly impacting quality of life with intense pruritus, sleep disruption  - Start dupilumab  600mg  subcutaneously on day 0, then 300mg  every other week starting day 14 - Side effects reviewed at length: injection site reactions, nasopharyngitis/conjunctivitis/URI, headache, exacerbation of atopic dermatitis, parasitic infection (be careful when traveling or camping) - Discussed needs to have container for sharps and proper disposal - Continue clobetasol  ointment 0.05% twice daily to affected skin Discussed side effect of super potent topical steroids including atrophy, dyspigmentation, striae, telangectasia, folliculitis, loss of skin pigment, hair growth, tachyphylaxis, risk of systemic absorption with missuse. - Continue triamcinolone  ointment 0.1% twice daily to affected areas of skin Discussed side effect of potent topical steroids including atrophy, dyspigmentation, striae, telangectasia, folliculitis, loss of skin pigment, hair growth, tachyphylaxis, risk of systemic absorption with missuse. Continue  Clobetasol  Shampoo 0.05% Apply to affected areas of scalp. Let sit for 15 minutes. Then add water, lather, and rinse out  Continue  fluocinolone  oil 0.01% nightly, and then wearing a shower cap to bed to let oil sit on scalp. Educated on risks of high potency topical steroids Start tacrolimus  ointment 0.1% twice daily on affected areas  Educated about black box warning (and also that recent studies show no increased malignancy risk) and common adverse effects such as burning with application (advised to cool in fridge and only apply to bone-dry skin). Given location of eruption, unsafe to use daily topical CS to area. Patient requires topical calcineurin inhibitor given high risk of dyspigmentation and atrophy from topical CS use in sensitive area.

## 2024-12-25 NOTE — Telephone Encounter (Signed)
 Patient called to discuss her biopsy results and let Dr Claudene know the Ivermectin  is not working she is not improving, also the biopsy site may be injected.  Discussed with patient Dr Claudene has not reviewed her pathology results once he review them someone will call her to go over results, asked patient to send a photo on mychart of the biopsy site for Dr Claudene to review,

## 2024-12-26 ENCOUNTER — Encounter: Payer: Self-pay | Admitting: Dermatology

## 2024-12-26 ENCOUNTER — Ambulatory Visit (INDEPENDENT_AMBULATORY_CARE_PROVIDER_SITE_OTHER): Admitting: Dermatology

## 2024-12-26 ENCOUNTER — Ambulatory Visit: Payer: Self-pay | Admitting: Dermatology

## 2024-12-26 DIAGNOSIS — R21 Rash and other nonspecific skin eruption: Secondary | ICD-10-CM

## 2024-12-26 MED ORDER — PREDNISONE 10 MG PO TABS: ORAL_TABLET | ORAL | 0 refills | Status: AC

## 2024-12-26 NOTE — Patient Instructions (Addendum)
 Risks of prednisone taper include mood irritability, insomnia, weight gain, stomach ulcers, increased risk of infection, increased blood sugar (diabetes), hypertension, osteoporosis with long-term or frequent use, and rare risk of avascular necrosis of the hip.        Due to recent changes in healthcare laws, you may see results of your pathology and/or laboratory studies on MyChart before the doctors have had a chance to review them. We understand that in some cases there may be results that are confusing or concerning to you. Please understand that not all results are received at the same time and often the doctors may need to interpret multiple results in order to provide you with the best plan of care or course of treatment. Therefore, we ask that you please give us  2 business days to thoroughly review all your results before contacting the office for clarification. Should we see a critical lab result, you will be contacted sooner.   If You Need Anything After Your Visit  If you have any questions or concerns for your doctor, please call our main line at [phone] and press option 4 to reach your doctor's medical assistant. If no one answers, please leave a voicemail as directed and we will return your call as soon as possible. Messages left after 4 pm will be answered the following business day.   You may also send us  a message via MyChart. We typically respond to MyChart messages within 1-2 business days.  For prescription refills, please ask your pharmacy to contact our office. Our fax number is [phone].  If you have an urgent issue when the clinic is closed that cannot wait until the next business day, you can page your doctor at the number below.    Please note that while we do our best to be available for urgent issues outside of office hours, we are not available 24/7.   If you have an urgent issue and are unable to reach us , you may choose to seek medical care at your  doctor's office, retail clinic, urgent care center, or emergency room.  If you have a medical emergency, please immediately call 911 or go to the emergency department.  Pager Numbers  - Dr. Hester: [phone]  - Dr. Jackquline: [phone]  - Dr. Claudene: [phone]   - Dr. Raymund: [phone]  In the event of inclement weather, please call our main line at [phone] for an update on the status of any delays or closures.  Dermatology Medication Tips: Please keep the boxes that topical medications come in in order to help keep track of the instructions about where and how to use these. Pharmacies typically print the medication instructions only on the boxes and not directly on the medication tubes.   If your medication is too expensive, please contact our office at [phone] option 4 or send us  a message through MyChart.   We are unable to tell what your co-pay for medications will be in advance as this is different depending on your insurance coverage. However, we may be able to find a substitute medication at lower cost or fill out paperwork to get insurance to cover a needed medication.   If a prior authorization is required to get your medication covered by your insurance company, please allow us  1-2 business days to complete this process.  Drug prices often vary depending on where the prescription is filled and some pharmacies may offer cheaper prices.  The website www.goodrx.com contains coupons for medications through different pharmacies. The  prices here do not account for what the cost may be with help from insurance (it may be cheaper with your insurance), but the website can give you the price if you did not use any insurance.  - You can print the associated coupon and take it with your prescription to the pharmacy.  - You may also stop by our office during regular business hours and pick up a GoodRx coupon card.  - If you need your prescription sent electronically to  a different pharmacy, notify our office through [facility] or by phone at [phone] option 4.     Si Usted Necesita Algo Despus de Su Visita  Tambin puede enviarnos un mensaje a travs de Clinical Cytogeneticist. Por lo general respondemos a los mensajes de MyChart en el transcurso de 1 a 2 das hbiles.  Para renovar recetas, por favor pida a su farmacia que se ponga en contacto con nuestra oficina. Randi lakes de fax es [place] [phone].  Si tiene un asunto urgente cuando la clnica est cerrada y que no puede esperar hasta el siguiente da hbil, puede llamar/localizar a su doctor(a) al nmero que aparece a continuacin.   Por favor, tenga en cuenta que aunque hacemos todo lo posible para estar disponibles para asuntos urgentes fuera del horario de [place], no estamos disponibles las 24 horas del da, los 7 [address]  [address] de la [place].   Si tiene un problema urgente y no puede comunicarse con nosotros, puede optar por buscar atencin mdica  en el consultorio de su doctor(a), en una clnica privada, en un centro de atencin urgente o en una sala de emergencias.  Si tiene engineer, drilling, por favor llame inmediatamente al 911 o vaya a la sala de emergencias.  Nmeros de bper  - Dr. Hester: [phone]  - Dra. Jackquline: [phone]  - Dr. Claudene: [phone]  - Dra. Kitts: [phone]  En caso de inclemencias del [place], por favor llame a nuestra lnea principal al [phone] para una actualizacin sobre el estado de cualquier retraso o cierre.  Consejos para la medicacin en dermatologa: Por favor, guarde las cajas en las que vienen los medicamentos de uso tpico para ayudarle a seguir las instrucciones sobre dnde y cmo usarlos. Las farmacias generalmente imprimen las instrucciones del medicamento slo en las cajas y no directamente en los tubos del [place].   Si su medicamento es muy caro, por favor, pngase en contacto con landry rieger llamando al [phone] y  presione la opcin 4 o envenos un mensaje a travs de Clinical Cytogeneticist.   No podemos decirle cul ser su copago por los medicamentos por adelantado ya que esto es diferente dependiendo de la cobertura de su seguro. Sin embargo, es posible que podamos encontrar un medicamento sustituto a audiological scientist un formulario para que el seguro cubra el medicamento que se considera necesario.   Si se requiere una autorizacin previa para que su compaa de seguros cubra su medicamento, por favor permtanos de 1 a 2 das hbiles para completar este proceso.  Los precios de los medicamentos varan con frecuencia dependiendo del environmental consultant de dnde se surte la receta y alguna farmacias pueden ofrecer precios ms baratos.  El sitio web www.goodrx.com tiene cupones para medicamentos de health and safety inspector. Los precios aqu no tienen en cuenta lo que podra costar con la ayuda del seguro (puede ser ms barato con su seguro), pero el sitio web puede darle el precio si no utiliz tourist information centre manager.  - Puede imprimir el cupn correspondiente y  llevarlo con su receta a la farmacia.  - Tambin puede pasar por nuestra oficina durante el horario de atencin regular y education officer, museum una tarjeta de cupones de GoodRx.  - Si necesita que su receta se enve electrnicamente a una farmacia diferente, informe a nuestra oficina a travs de MyChart de [place] o por telfono llamando al [phone] y presione la opcin 4.

## 2024-12-26 NOTE — Progress Notes (Signed)
" ° °  Follow-Up Visit   Subjective  Natalie Dickerson is a 63 y.o. female who presents for the following: Patient states she has been having breakouts all over body and has had biopsy previously on Tuesday 12/19/24.    The following portions of the chart were reviewed this encounter and updated as appropriate: medications, allergies, medical history  Review of Systems:  No other skin or systemic complaints except as noted in HPI or Assessment and Plan.  Objective  Well appearing patient in no apparent distress; mood and affect are within normal limits.  A focused examination was performed of the following areas: Face,chest,arms,legs   Relevant exam findings are noted in the Assessment and Plan.    Assessment & Plan   Rash  Exam: numerous pink inflamed papules scattered on back > abdomen and extremities. Increased on back. Possibly slightly decreased on extremities  12/19/24 Biopsy: PERIVASCULAR DERMATITIS WITH EOSINOPHILS AND FOCAL SPONGIOSIS  Differential still includes drug eruption, scabies, and atopic dermatitis  Plan: Finish Ivermectin  to cover for scabies To treat drug eruption vs atopic derm, start prednisone  Start Prednisone  40 mg x 4 days, then 30 mg x 4 days, then 20 mg x 4 days then 10 mg x 4 days #40 0RF  Risks of prednisone  taper include mood irritability, insomnia, weight gain, stomach ulcers, increased risk of infection, increased blood sugar (diabetes), hypertension, osteoporosis with long-term or frequent use, and rare risk of avascular necrosis of the hip.  Biopsy site has fibrin and appropriate peripheral hyperemia. No tenderness to palpation. To cover for infection, start doxycycline  Start Doxycycline  tablets as directed   Consider Dupixent  if not improved or if improves but then recurs RASH    Return in about 1 week (around 01/02/2025) for rash .  IAlmetta Nora, RMA, am acting as scribe for Boneta Sharps, MD .   Documentation: I have reviewed  the above documentation for accuracy and completeness, and I agree with the above.  Boneta Sharps, MD    "

## 2024-12-28 ENCOUNTER — Encounter: Payer: Self-pay | Admitting: Dermatology

## 2025-01-01 ENCOUNTER — Other Ambulatory Visit: Payer: Self-pay

## 2025-01-01 ENCOUNTER — Other Ambulatory Visit: Payer: Self-pay | Admitting: Dermatology

## 2025-01-01 DIAGNOSIS — R21 Rash and other nonspecific skin eruption: Secondary | ICD-10-CM

## 2025-01-01 MED ORDER — CLOBETASOL PROPIONATE 0.05 % EX OINT: TOPICAL_OINTMENT | CUTANEOUS | 5 refills | Status: DC

## 2025-01-01 MED ORDER — DUPIXENT 300 MG/2ML ~~LOC~~ SOAJ: 300.0000 mg | SUBCUTANEOUS | 10 refills | Status: DC

## 2025-01-01 NOTE — Progress Notes (Signed)
 I called [phone]   Please do not fill Dupixent  injection for this patient, we have changed patients treatment plan.

## 2025-01-02 ENCOUNTER — Ambulatory Visit: Admitting: Dermatology

## 2025-01-02 ENCOUNTER — Encounter: Payer: Self-pay | Admitting: Dermatology

## 2025-01-02 DIAGNOSIS — R21 Rash and other nonspecific skin eruption: Secondary | ICD-10-CM

## 2025-01-02 NOTE — Progress Notes (Signed)
" ° °  Follow-Up Visit   Subjective  Natalie Dickerson is a 64 y.o. female who presents for the following: Rash f/u, bx showed Perivascular derm with Eosinophils and focal spongiosis, differential Drug eruption vs scabies vs Atopic Derm, back, arms, legs, abdomen, finished Ivermectin , Currently on Prednisone  taper, Doxycycline  10mg  1 po bid, ran out of Clobetasol  cr improving but not clear, still some itching prn on flanks, legs   The following portions of the chart were reviewed this encounter and updated as appropriate: medications, allergies, medical history  Review of Systems:  No other skin or systemic complaints except as noted in HPI or Assessment and Plan.  Objective  Well appearing patient in no apparent distress; mood and affect are within normal limits.  A focused examination was performed of the following areas: Trunk, arms, legs  Relevant exam findings are noted in the Assessment and Plan.    Assessment & Plan   Rash Exam: Reduced scattered pink inflamed papules on trunk and extremities. Greatest persistence on back. Healing biopsy site with reduced erythema and fibrinoid crusting  12/19/24 Biopsy: PERIVASCULAR DERMATITIS WITH EOSINOPHILS AND FOCAL SPONGIOSIS   Chronic and persistent condition with duration or expected duration over one year. Condition is symptomatic/ bothersome to patient. Not currently at goal.   Differential diagnosis:  Drug Eruption vs Scabies vs Atopic Dermatitis  Treatment Plan: Finish Doxycycline  100mg  1 po bid  Finish Prednisone  taper, pt currently on day one of 30mg , (Prednisone  40 mg x 4 days, then 30 mg x 4 days, then 20 mg x 4 days then 10 mg x 4 days ) tolerating without side effects Start Fluocinolone  oil qd/bid to itchy areas Restart Clobetasol  oint qd/bid until itchy rash clears, avoid f/g/a, (patient waiting for insurance to refill) Start Dupixent  if recurrent  Topical steroids (such as triamcinolone , fluocinolone , fluocinonide,  mometasone , clobetasol , halobetasol, betamethasone , hydrocortisone) can cause thinning and lightening of the skin if they are used for too long in the same area. Your physician has selected the right strength medicine for your problem and area affected on the body. Please use your medication only as directed by your physician to prevent side effects.   Risks of prednisone  taper include mood irritability, insomnia, weight gain, stomach ulcers, increased risk of infection, increased blood sugar (diabetes), hypertension, osteoporosis with long-term or frequent use, and rare risk of avascular necrosis of the hip.    Doxycycline  should be taken with food to prevent nausea. Do not lay down for 30 minutes after taking. Be cautious with sun exposure and use good sun protection while on this medication. Pregnant women should not take this medication.    RASH AND OTHER NONSPECIFIC SKIN ERUPTION   Existing Treatments - ivermectin  (STROMECTOL ) 3 MG TABS tablet - Take 4 tablets once then a second time in 14 days  Return for patient will call when she finishes Prednisone  and schedule appt if not resolved.  I, Grayce Saunas, RMA, am acting as scribe for Boneta Sharps, MD .   Documentation: I have reviewed the above documentation for accuracy and completeness, and I agree with the above.  Boneta Sharps, MD    "

## 2025-01-02 NOTE — Patient Instructions (Signed)

## 2025-01-10 ENCOUNTER — Other Ambulatory Visit (INDEPENDENT_AMBULATORY_CARE_PROVIDER_SITE_OTHER): Payer: Self-pay | Admitting: Vascular Surgery

## 2025-01-10 DIAGNOSIS — I722 Aneurysm of renal artery: Secondary | ICD-10-CM

## 2025-01-10 DIAGNOSIS — I150 Renovascular hypertension: Secondary | ICD-10-CM

## 2025-01-12 ENCOUNTER — Ambulatory Visit (INDEPENDENT_AMBULATORY_CARE_PROVIDER_SITE_OTHER): Payer: BC Managed Care – PPO | Admitting: Vascular Surgery

## 2025-01-12 ENCOUNTER — Ambulatory Visit (INDEPENDENT_AMBULATORY_CARE_PROVIDER_SITE_OTHER): Payer: BC Managed Care – PPO

## 2025-01-12 ENCOUNTER — Encounter (INDEPENDENT_AMBULATORY_CARE_PROVIDER_SITE_OTHER): Payer: Self-pay | Admitting: Vascular Surgery

## 2025-01-12 VITALS — BP 135/82 | HR 66 | Resp 18 | Wt 138.0 lb

## 2025-01-12 DIAGNOSIS — I722 Aneurysm of renal artery: Secondary | ICD-10-CM

## 2025-01-12 DIAGNOSIS — I150 Renovascular hypertension: Secondary | ICD-10-CM | POA: Diagnosis not present

## 2025-01-12 DIAGNOSIS — H93A9 Pulsatile tinnitus, unspecified ear: Secondary | ICD-10-CM

## 2025-01-12 NOTE — Progress Notes (Signed)
 "   MRN : [id number]  Natalie Dickerson is a 64 y.o. (04/28/1961) female who presents with chief complaint of  Chief Complaint  Patient presents with   Follow-up    40yr renal follow up  .  History of Present Illness:   Discussed the use of AI scribe software for clinical note transcription with the patient, who gave verbal consent to proceed.  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 This was done about 8 years ago now.  She initially had covered stent placement to primary renal artery branches but ultimately thrombosed and had a small polar branch that was treated with coil embolization.  This has markedly improved her blood pressure and her renal function has remained normal.  She reports that her blood pressure has been well controlled on low-dose losartan  once daily.  She recently developed a rash, which she does not associate with her vascular procedure, and wonders if there is any possibility it could be from the coils.  These were placed about 8 years ago.     Results Renal ultrasound Solitary kidney with normal appearance and preserved flow. No evidence of aneurysm progression or flow limitation with normal velocities in the left renal artery and no signs of aneurysms.  Current Outpatient Medications  Medication Sig Dispense Refill   aspirin  EC 81 MG EC tablet Take 1 tablet (81 mg total) by mouth daily. 90 tablet 3   clobetasol  ointment (TEMOVATE ) 0.05 % Apply 1 gram topically to affected area of skin twice daily. Stop once resolved and restart as needed for flares. Avoid use on face, armpits, groin unless otherwise indicated. 60 g 5   Clobetasol  Propionate 0.05 % shampoo Apply 4 mL of medication to affected areas of scalp. Let sit for 15 minutes. Then add water, lather, and rinse out (Patient not taking: Reported on 01/12/2025) 120 mL 5    Clobetasol  Propionate 0.05 % shampoo Apply 4 mL of medication to affected areas of scalp. Let sit for 15 minutes. Then add water, lather, and rinse out (Patient not taking: Reported on 01/12/2025) 120 mL 5   Fluocinolone  Acetonide Body 0.01 % OIL Apply 2 mL twice daily to affected areas of skin. Stop once resolved and restart as needed for flares. Avoid use on face, armpits, groin unless otherwise indicated. (Patient not taking: Reported on 01/12/2025) 120 mL 5   Fluocinolone  Acetonide Body 0.01 % OIL Apply 2 mL twice daily to affected areas of skin. Stop once resolved and restart as needed for flares. Avoid use on face, armpits, groin unless otherwise indicated. (Patient not taking: Reported on 01/12/2025) 120 mL 5   ivermectin  (STROMECTOL ) 3 MG TABS tablet Take 4 tablets once then a second time in 14 days (Patient not taking: Reported on 01/12/2025) 8 tablet 0   losartan  (COZAAR ) 25 MG tablet Take 1 tablet (25 mg total) by mouth daily. 90 tablet 1   Multiple Vitamins-Minerals (MULTIVITAMIN WITH MINERALS) tablet Take 1 tablet by mouth daily.     tacrolimus  (PROTOPIC ) 0.1 % ointment Apply 2 grams twice daily to affected areas of skin (Patient not taking: Reported on 01/12/2025) 100 g 5   triamcinolone  ointment (KENALOG ) 0.1 % Apply 7 grams twice daily to affected areas of skin. Stop once resolved and restart as needed for flares. Avoid use on face, armpits, groin unless otherwise indicated. (Patient not taking: Reported on 01/12/2025)  454 g 2   No current facility-administered medications for this visit.    Past Medical History:  Diagnosis Date   Actinic keratosis 05/01/2014   Left superior forehead   Cancer (HCC)    skin ca   Hx of basal cell carcinoma 12/06/2013   Left sup forehead    Past Surgical History:  Procedure Laterality Date   COLONOSCOPY WITH PROPOFOL  N/A 08/07/2022   Procedure: COLONOSCOPY WITH PROPOFOL ;  Surgeon: Jinny Carmine, MD;  Location: [facility] ENDOSCOPY;  Service: Endoscopy;   Laterality: N/A;   EMBOLIZATION (CATH LAB) N/A 03/24/2017   Procedure: Embolization;  Surgeon: Selinda GORMAN Gu, MD;  Location: [facility] INVASIVE CV LAB;  Service: Cardiovascular;  Laterality: N/A;   FINGER SURGERY     PERIPHERAL VASCULAR CATHETERIZATION N/A 01/18/2017   Procedure: Renal Angiography;  Surgeon: Selinda GORMAN Gu, MD;  Location: [facility] INVASIVE CV LAB;  Service: Cardiovascular;  Laterality: N/A;   PERIPHERAL VASCULAR CATHETERIZATION  01/18/2017   Procedure: Renal Intervention;  Surgeon: Selinda GORMAN Gu, MD;  Location: [facility] INVASIVE CV LAB;  Service: Cardiovascular;;   RENAL ANGIOGRAPHY N/A 03/24/2017   Procedure: Renal Angiography;  Surgeon: Selinda GORMAN Gu, MD;  Location: [facility] INVASIVE CV LAB;  Service: Cardiovascular;  Laterality: N/A;     Social History[1]    Family History  Problem Relation Age of Onset   Hypertension Mother    Breast cancer Mother 3   Cancer Mother        breast cancer   Breast cancer Father 41   Hypertension Brother      Allergies[2]   REVIEW OF SYSTEMS (Negative unless checked)  Constitutional: [] Weight loss  [] Fever  [] Chills Cardiac: [] Chest pain   [] Chest pressure   [] Palpitations   [] Shortness of breath when laying flat   [] Shortness of breath at rest   [] Shortness of breath with exertion. Vascular:  [] Pain in legs with walking   [] Pain in legs at rest   [] Pain in legs when laying flat   [] Claudication   [] Pain in feet when walking  [] Pain in feet at rest  [] Pain in feet when laying flat   [] History of DVT   [] Phlebitis   [] Swelling in legs   [] Varicose veins   [] Non-healing ulcers Pulmonary:   [] Uses home oxygen   [] Productive cough   [] Hemoptysis   [] Wheeze  [] COPD   [] Asthma Neurologic:  [] Dizziness  [] Blackouts   [] Seizures   [] History of stroke   [] History of TIA  [] Aphasia   [] Temporary blindness   [] Dysphagia   [] Weakness or numbness in arms   [] Weakness or numbness in legs Musculoskeletal:  [x] Arthritis   [] Joint swelling   [] Joint pain   [x] Low back  pain Hematologic:  [] Easy bruising  [] Easy bleeding   [] Hypercoagulable state   [] Anemic   Gastrointestinal:  [] Blood in stool   [] Vomiting blood  [] Gastroesophageal reflux/heartburn   [] Abdominal pain Genitourinary:  [] Chronic kidney disease   [] Difficult urination  [] Frequent urination  [] Burning with urination   [] Hematuria Skin:  [] Rashes   [] Ulcers   [] Wounds Psychological:  [] History of anxiety   []  History of major depression.  Physical Examination  BP 135/82 (BP Location: Left Arm)   Pulse 66   Resp 18   Wt 138 lb (62.6 kg)   BMI 24.45 kg/m  Gen:  WD/WN, NAD.  Appears younger than stated age Head: [place]/AT, No temporalis wasting. Ear/Nose/Throat: Hearing grossly intact, nares w/o erythema or drainage Eyes: Conjunctiva clear. Sclera non-icteric Neck: Supple.  Trachea midline  Pulmonary:  Good air movement, no use of accessory muscles.  Cardiac: RRR, no JVD Vascular:  Vessel Right Left  Radial Palpable Palpable           Musculoskeletal: M/S 5/5 throughout.  No deformity or atrophy.  No edema. Neurologic: Sensation grossly intact in extremities.  Symmetrical.  Speech is fluent.  Psychiatric: Judgment intact, Mood & affect appropriate for pt's clinical situation. Dermatologic: No rashes or ulcers noted.  No cellulitis or open wounds.  Physical Exam     Labs Recent Results (from the past 2160 hours)  Comprehensive metabolic panel with GFR     Status: None   Collection Time: 11/28/24  4:18 PM  Result Value Ref Range   Glucose 81 70 - 99 mg/dL   BUN 22 8 - 27 mg/dL   Creatinine, Ser 9.01 0.57 - 1.00 mg/dL   eGFR 66 >40 fO/fpw/8.26   BUN/Creatinine Ratio 22 12 - 28   Sodium 138 134 - 144 mmol/L   Potassium 4.5 3.5 - 5.2 mmol/L   Chloride 102 96 - 106 mmol/L   CO2 22 20 - 29 mmol/L   Calcium  9.8 8.7 - 10.3 mg/dL   Total Protein 6.9 6.0 - 8.5 g/dL   Albumin 4.4 3.9 - 4.9 g/dL   Globulin, Total 2.5 1.5 - 4.5 g/dL   Bilirubin Total 0.3 0.0 - 1.2 mg/dL   Alkaline  Phosphatase 88 49 - 135 IU/L   AST 19 0 - 40 IU/L   ALT 14 0 - 32 IU/L  Hemoglobin A1c     Status: None   Collection Time: 11/28/24  4:18 PM  Result Value Ref Range   Hgb A1c MFr Bld 5.5 4.8 - 5.6 %    Comment:          Prediabetes: 5.7 - 6.4          Diabetes: >6.4          Glycemic control for adults with diabetes: <7.0    Est. average glucose Bld gHb Est-mCnc 111 mg/dL  Surgical pathology     Status: None   Collection Time: 12/19/24 12:00 AM  Result Value Ref Range   SURGICAL PATHOLOGY      SURGICAL PATHOLOGY [facility] [address], [address] [place], [place] [address] Telephone [phone] or [phone] Fax [phone]  REPORT OF DERMATOPATHOLOGY   Accession #: [id number] Patient Name: Natalie Dickerson, Natalie Dickerson Visit # : [id number]  MRN: [id number] Cytotechnologist: Melia Rolla Wanda GORMAN., Dermatopathologist, Electronic Signature DOB/Age [birth year] (Age: 65) Gender: F Collected Date: 12/19/2024 Received Date: 12/19/2024  FINAL DIAGNOSIS       1. Skin, right abdomen (side) - lower :       PERIVASCULAR DERMATITIS WITH EOSINOPHILS AND FOCAL SPONGIOSIS. SEE DESCRIPTION       DATE SIGNED OUT: 12/22/2024 ELECTRONIC SIGNATURE : Melia Rolla, Wanda GORMAN., Dermatopathologist, Electronic Signature  MICROSCOPIC DESCRIPTION 1. There is a perivascular superficial and mid dermal inflammatory infiltrate of lymphocytes, histiocytes and scattered eosinophils in the dermis. The epidermis shows foci of spongiosis.   Overall, although this could  be an early eczematous dermatitis, the differential diagnosis includes an arthropod bite or drug reaction.  No scabies mites are identified.  Following review of the hematoxylin and eosin sections, a PAS stain was obtained to exclude a fungal infection. The PAS stain is negative for fungal organisms.  CASE COMMENTS STAINS USED IN DIAGNOSIS: H&E Stains used in diagnosis 1 *PAS/F Stain    CLINICAL  HISTORY  SPECIMEN(S) OBTAINED 1. Skin, Right Abdomen (side) - Lower  SPECIMEN COMMENTS: SPECIMEN CLINICAL INFORMATION: 1. Rash and other nonspecific skin eruption, eczema vs urticaria vs Grover's scabies    Gross Description 1. Formalin fixed specimen received:  9 X 7 X 1 MM, TOTO (3 P) (1 B) ( mm )        Report signed out from the following location(s) [place] [address] [place], [place] [address] CLIA #: [id number]  [facility] [address] [place], [place] [address] CLIA #: [id number]     Radiology No results found.  Assessment/Plan Assessment & Plan Renal artery aneurysm of native kidney Status post endovascular embolization. Metal allergy to coils unlikely, but her primary care physician can check if they desire. - Annual renal ultrasound surveillance. - Educated on symptoms of concern such as new hypertension or renal compromise.  Renovascular hypertension Well controlled on low dose losartan  with no evidence of worsening. - Continue losartan  at current dose. - Educated on monitoring hypertension symptoms and advised to report changes.  Pulsatile tinnitus Carotid duplex was normal.  Recheck as needed     Selinda Gu, MD  01/12/2025 9:24 AM    This note was created with Dragon medical transcription system.  Any errors from dictation are purely unintentional     [1]  Social History Tobacco Use   Smoking status: Former    Types: Cigarettes   Smokeless tobacco: Never   Tobacco comments:    Smoked for about 5 years, smoked about 1 pack per day. Quit in 1984  Vaping Use   Vaping status: Never Used  Substance Use Topics   Alcohol use: Yes    Comment: Occasional alcohol use; Drinks 2-4 glasses of wine per week.   Drug use: No  [2]  Allergies Allergen Reactions   Bee Venom Anaphylaxis   Ivp Dye [Iodinated Contrast Media] Rash   "

## 2025-01-22 ENCOUNTER — Ambulatory Visit

## 2025-01-24 ENCOUNTER — Ambulatory Visit

## 2025-01-24 DIAGNOSIS — R21 Rash and other nonspecific skin eruption: Secondary | ICD-10-CM | POA: Diagnosis not present

## 2025-01-24 DIAGNOSIS — L2081 Atopic neurodermatitis: Secondary | ICD-10-CM

## 2025-01-24 MED ORDER — CLOBETASOL PROPIONATE 0.05 % EX OINT: TOPICAL_OINTMENT | CUTANEOUS | 1 refills | Status: AC

## 2025-01-24 MED ORDER — DUPILUMAB 300 MG/2ML ~~LOC~~ SOAJ
600.0000 mg | Freq: Once | SUBCUTANEOUS | Status: AC
  Administered 2025-01-24: 600 mg via SUBCUTANEOUS

## 2025-01-24 NOTE — Progress Notes (Signed)
 "   Subjective   Natalie Dickerson is a 64 y.o. female who presents for the following: Follow up of rash. Patient is established patient   Today patient reports: 12/19/24 Biopsy: PERIVASCULAR DERMATITIS WITH EOSINOPHILS AND FOCAL SPONGIOSIS Drug eruption vs Scabies vs Atopic Derm, all over, Using Clobetasol  oint bid to tid, Vaseline, itchy, not much improvement, pt finished Prednisone  taper, Doxycyline, and po Ivermectin , pt was improving but as finished prednisone  she started flaring again  Review of Systems:    No other skin or systemic complaints except as noted in HPI or Assessment and Plan.  The following portions of the chart were reviewed this encounter and updated as appropriate: medications, allergies, medical history  Relevant Medical History:  n/a   Objective  (SKPE) Well appearing patient in no apparent distress; mood and affect are within normal limits. Examination was performed of the: Focused Exam of: arms, legs, trunk   Examination notable for: Diffusely with erythematous papules - some excoriated  Examination limited by: Undergarments     Assessment & Plan  (SKAP)   Diffuse prurituc erythematous papules - favor atopic dermatitis  Moderate with itch impacting QOL IGA-PN Stage/Activity 3, IGA Eczema 3  Chronic and persistent condition with duration or expected duration over one year. Condition is symptomatic and bothersome to patient. Patient is flaring and not currently at treatment goal.   - Biopsy 12/19/24 - PERIVASCULAR DERMATITIS WITH EOSINOPHILS AND FOCAL SPONGIOSIS- early eczematous  dermatitis, the differential diagnosis includes an arthropod bite or drug reaction.  No scabies mites are identified.  - Prior treatments: antihistamines (minimal improvement), triamcinolone , tacrolimus , clobetasol  (some improvement with topicals but continues to flare), ivermectin  (no change), prednisone  (improved)  - Favor atopic dermatitis and recommended initiation of dupixent  -  agrees, and initiated in clinic today  - For severe areas: cont clobetasol  0.5% ointment twice daily, avoid f/g/a Discussed side effect of super potent topical steroids including atrophy, dyspigmentation, striae, telangectasia, folliculitis, loss of skin pigment, hair growth, tachyphylaxis, risk of systemic absorption with missuse.   - Start dupilumab  - ADULTS - 600mg  subcutaneously on day 0, then 300mg  every other week starting day 14 - Side effects reviewed at length: injection site reactions, nasopharyngitis/conjunctivitis/URI, headache, exacerbation of atopic dermatitis, parasitic infection (be careful when traveling or camping)   Loading dose given in clinic -  Dupixent  300mg /46ml sq injections x 2 today to L and R ant thigh, Lot [id number], exp 12/27/26, Patient taught how to self inject and she injected R thigh.  Patient also given 1 auto injector for next injection in 2 weeks.  Patient has been approved for Dupixent , advised pt to call [facility] for shipment.  Procedures, orders, diagnosis for this visit:  RASH   This Visit - clobetasol  ointment (TEMOVATE ) 0.05 % - Apply 1 gram topically to affected area of skin twice daily. Stop once resolved and restart as needed for flares. Avoid use on face, armpits, groin unless otherwise indicated.  Rash -     Clobetasol  Propionate; Apply 1 gram topically to affected area of skin twice daily. Stop once resolved and restart as needed for flares. Avoid use on face, armpits, groin unless otherwise indicated.  Dispense: 60 g; Refill: 1    Return to clinic: Return for 4-6 wks Atopic derm f/u.  I, Grayce Saunas, RMA, am acting as scribe for Lauraine JAYSON Kanaris, MD .   Documentation: I have reviewed the above documentation for accuracy and completeness, and I agree with the above.  Lauraine JAYSON Kanaris,  MD  "

## 2025-01-24 NOTE — Patient Instructions (Addendum)
 [place]   Due to recent changes in healthcare laws, you may see results of your pathology and/or laboratory studies on MyChart before the doctors have had a chance to review them. We understand that in some cases there may be results that are confusing or concerning to you. Please understand that not all results are received at the same time and often the doctors may need to interpret multiple results in order to provide you with the best plan of care or course of treatment. Therefore, we ask that you please give us  2 business days to thoroughly review all your results before contacting the office for clarification. Should we see a critical lab result, you will be contacted sooner.   If You Need Anything After Your Visit  If you have any questions or concerns for your doctor, please call our main line at [phone] and press option 4 to reach your doctor's medical assistant. If no one answers, please leave a voicemail as directed and we will return your call as soon as possible. Messages left after 4 pm will be answered the following business day.   You may also send us  a message via MyChart. We typically respond to MyChart messages within 1-2 business days.  For prescription refills, please ask your pharmacy to contact our office. Our fax number is [phone].  If you have an urgent issue when the clinic is closed that cannot wait until the next business day, you can page your doctor at the number below.    Please note that while we do our best to be available for urgent issues outside of office hours, we are not available 24/7.   If you have an urgent issue and are unable to reach us , you may choose to seek medical care at your doctor's office, retail clinic, urgent care center, or emergency room.  If you have a medical emergency, please immediately call 911 or go to the emergency department.  Pager Numbers  - Dr. Hester: [phone]  - Dr. Jackquline: [phone]  - Dr.  Claudene: [phone]   - Dr. Raymund: [phone]  In the event of inclement weather, please call our main line at [phone] for an update on the status of any delays or closures.  Dermatology Medication Tips: Please keep the boxes that topical medications come in in order to help keep track of the instructions about where and how to use these. Pharmacies typically print the medication instructions only on the boxes and not directly on the medication tubes.   If your medication is too expensive, please contact our office at [phone] option 4 or send us  a message through MyChart.   We are unable to tell what your co-pay for medications will be in advance as this is different depending on your insurance coverage. However, we may be able to find a substitute medication at lower cost or fill out paperwork to get insurance to cover a needed medication.   If a prior authorization is required to get your medication covered by your insurance company, please allow us  1-2 business days to complete this process.  Drug prices often vary depending on where the prescription is filled and some pharmacies may offer cheaper prices.  The website www.goodrx.com contains coupons for medications through different pharmacies. The prices here do not account for what the cost may be with help from insurance (it may be cheaper with your insurance), but the website can give you the price if you did not use any insurance.  -  You can print the associated coupon and take it with your prescription to the pharmacy.  - You may also stop by our office during regular business hours and pick up a GoodRx coupon card.  - If you need your prescription sent electronically to a different pharmacy, notify our office through [facility] or by phone at [phone] option 4.     Si Usted Necesita Algo Despus de Su Visita  Tambin puede enviarnos un mensaje a travs de Clinical Cytogeneticist. Por lo general respondemos a los mensajes  de MyChart en el transcurso de 1 a 2 das hbiles.  Para renovar recetas, por favor pida a su farmacia que se ponga en contacto con nuestra oficina. Randi lakes de fax es [place] [phone].  Si tiene un asunto urgente cuando la clnica est cerrada y que no puede esperar hasta el siguiente da hbil, puede llamar/localizar a su doctor(a) al nmero que aparece a continuacin.   Por favor, tenga en cuenta que aunque hacemos todo lo posible para estar disponibles para asuntos urgentes fuera del horario de [place], no estamos disponibles las 24 horas del da, los 7 [address] de la [place].   Si tiene un problema urgente y no puede comunicarse con nosotros, puede optar por buscar atencin mdica  en el consultorio de su doctor(a), en una clnica privada, en un centro de atencin urgente o en una sala de emergencias.  Si tiene engineer, drilling, por favor llame inmediatamente al 911 o vaya a la sala de emergencias.  Nmeros de bper  - Dr. Hester: [phone]  - Dra. Jackquline: [phone]  - Dr. Claudene: [phone]  - Dra. Kitts: [phone]  En caso de inclemencias del [place], por favor llame a nuestra lnea principal al [phone] para una actualizacin sobre el estado de cualquier retraso o cierre.  Consejos para la medicacin en dermatologa: Por favor, guarde las cajas en las que vienen los medicamentos de uso tpico para ayudarle a seguir las instrucciones sobre dnde y cmo usarlos. Las farmacias generalmente imprimen las instrucciones del medicamento slo en las cajas y no directamente en los tubos del [place].   Si su medicamento es muy caro, por favor, pngase en contacto con landry rieger llamando al [phone] y presione la opcin 4 o envenos un mensaje a travs de Clinical Cytogeneticist.   No podemos decirle cul ser su copago por los medicamentos por adelantado ya que esto es diferente dependiendo de la cobertura de su seguro. Sin embargo, es posible que podamos encontrar un  medicamento sustituto a audiological scientist un formulario para que el seguro cubra el medicamento que se considera necesario.   Si se requiere una autorizacin previa para que su compaa de seguros cubra su medicamento, por favor permtanos de 1 a 2 das hbiles para completar este proceso.  Los precios de los medicamentos varan con frecuencia dependiendo del environmental consultant de dnde se surte la receta y alguna farmacias pueden ofrecer precios ms baratos.  El sitio web www.goodrx.com tiene cupones para medicamentos de health and safety inspector. Los precios aqu no tienen en cuenta lo que podra costar con la ayuda del seguro (puede ser ms barato con su seguro), pero el sitio web puede darle el precio si no utiliz tourist information centre manager.  - Puede imprimir el cupn correspondiente y llevarlo con su receta a la farmacia.  - Tambin puede pasar por nuestra oficina durante el horario de atencin regular y education officer, museum una tarjeta de cupones de GoodRx.  - Si necesita que su receta se enve electrnicamente  a [place], informe a nuestra oficina a travs de MyChart de [place] o por telfono llamando al [phone] y presione la opcin 4.

## 2025-01-25 ENCOUNTER — Telehealth: Payer: Self-pay

## 2025-01-25 NOTE — Telephone Encounter (Signed)
 Called pt to check in after dupi injection yesterday. Notes some improvement. NO side effects.

## 2025-01-29 ENCOUNTER — Telehealth: Payer: Self-pay

## 2025-01-29 MED ORDER — DUPIXENT 300 MG/2ML ~~LOC~~ SOAJ: 300.0000 mg | SUBCUTANEOUS | 2 refills | Status: AC

## 2025-01-29 NOTE — Telephone Encounter (Signed)
 Prescription sent to [facility].  [place] approved and preferred speciality pharmacy from insurance. aw

## 2025-02-05 ENCOUNTER — Ambulatory Visit: Admitting: Dermatology

## 2025-02-26 ENCOUNTER — Ambulatory Visit

## 2025-02-28 ENCOUNTER — Ambulatory Visit: Admitting: Dermatology

## 2025-06-12 ENCOUNTER — Encounter: Admitting: Family Medicine

## 2026-01-11 ENCOUNTER — Ambulatory Visit (INDEPENDENT_AMBULATORY_CARE_PROVIDER_SITE_OTHER): Admitting: Nurse Practitioner

## 2026-01-11 ENCOUNTER — Encounter (INDEPENDENT_AMBULATORY_CARE_PROVIDER_SITE_OTHER)
